# Patient Record
Sex: Female | Born: 1945 | Race: White | Hispanic: No | Marital: Single | Smoking: Former smoker
Health system: Southern US, Community
[De-identification: ages and names within clinical notes are randomized; demographics above are authoritative.]

## PROBLEM LIST (undated history)

## (undated) DIAGNOSIS — G43909 Migraine, unspecified, not intractable, without status migrainosus: Secondary | ICD-10-CM

## (undated) DIAGNOSIS — T8859XA Other complications of anesthesia, initial encounter: Secondary | ICD-10-CM

## (undated) DIAGNOSIS — N809 Endometriosis, unspecified: Secondary | ICD-10-CM

## (undated) DIAGNOSIS — Z9889 Other specified postprocedural states: Secondary | ICD-10-CM

## (undated) DIAGNOSIS — T4145XA Adverse effect of unspecified anesthetic, initial encounter: Secondary | ICD-10-CM

## (undated) DIAGNOSIS — K219 Gastro-esophageal reflux disease without esophagitis: Secondary | ICD-10-CM

## (undated) DIAGNOSIS — F329 Major depressive disorder, single episode, unspecified: Secondary | ICD-10-CM

## (undated) DIAGNOSIS — R079 Chest pain, unspecified: Secondary | ICD-10-CM

## (undated) DIAGNOSIS — R112 Nausea with vomiting, unspecified: Secondary | ICD-10-CM

## (undated) DIAGNOSIS — M1990 Unspecified osteoarthritis, unspecified site: Secondary | ICD-10-CM

## (undated) DIAGNOSIS — A6000 Herpesviral infection of urogenital system, unspecified: Secondary | ICD-10-CM

## (undated) DIAGNOSIS — F32A Depression, unspecified: Secondary | ICD-10-CM

## (undated) DIAGNOSIS — M8580 Other specified disorders of bone density and structure, unspecified site: Secondary | ICD-10-CM

## (undated) HISTORY — DX: Major depressive disorder, single episode, unspecified: F32.9

## (undated) HISTORY — PX: ABDOMINAL HYSTERECTOMY: SHX81

## (undated) HISTORY — PX: ABDOMINAL SURGERY: SHX537

## (undated) HISTORY — DX: Migraine, unspecified, not intractable, without status migrainosus: G43.909

## (undated) HISTORY — DX: Gastro-esophageal reflux disease without esophagitis: K21.9

## (undated) HISTORY — DX: Chest pain, unspecified: R07.9

## (undated) HISTORY — DX: Endometriosis, unspecified: N80.9

## (undated) HISTORY — DX: Other specified disorders of bone density and structure, unspecified site: M85.80

## (undated) HISTORY — DX: Unspecified osteoarthritis, unspecified site: M19.90

## (undated) HISTORY — DX: Depression, unspecified: F32.A

## (undated) HISTORY — PX: EXPLORATORY LAPAROTOMY: SUR591

## (undated) HISTORY — DX: Herpesviral infection of urogenital system, unspecified: A60.00

---

## 1969-12-23 HISTORY — PX: SALPINGOOPHORECTOMY: SHX82

## 1998-12-23 HISTORY — PX: COLONOSCOPY: SHX174

## 2005-12-23 HISTORY — PX: BUNIONECTOMY: SHX129

## 2010-12-23 HISTORY — PX: CATARACT EXTRACTION: SUR2

## 2011-09-16 DIAGNOSIS — H905 Unspecified sensorineural hearing loss: Secondary | ICD-10-CM | POA: Insufficient documentation

## 2011-09-16 DIAGNOSIS — I10 Essential (primary) hypertension: Secondary | ICD-10-CM | POA: Insufficient documentation

## 2011-09-16 DIAGNOSIS — F329 Major depressive disorder, single episode, unspecified: Secondary | ICD-10-CM | POA: Insufficient documentation

## 2011-09-16 DIAGNOSIS — M8580 Other specified disorders of bone density and structure, unspecified site: Secondary | ICD-10-CM | POA: Insufficient documentation

## 2011-09-16 DIAGNOSIS — A6000 Herpesviral infection of urogenital system, unspecified: Secondary | ICD-10-CM | POA: Insufficient documentation

## 2011-09-16 DIAGNOSIS — G43909 Migraine, unspecified, not intractable, without status migrainosus: Secondary | ICD-10-CM | POA: Insufficient documentation

## 2011-09-16 DIAGNOSIS — N390 Urinary tract infection, site not specified: Secondary | ICD-10-CM | POA: Insufficient documentation

## 2011-09-16 DIAGNOSIS — K5792 Diverticulitis of intestine, part unspecified, without perforation or abscess without bleeding: Secondary | ICD-10-CM | POA: Insufficient documentation

## 2011-12-24 HISTORY — PX: ROTATOR CUFF REPAIR: SHX139

## 2013-01-18 ENCOUNTER — Ambulatory Visit (HOSPITAL_COMMUNITY)
Admission: RE | Admit: 2013-01-18 | Discharge: 2013-01-18 | Disposition: A | Discharge status: Home / Self Care | Payer: Medicare Other | Source: Ambulatory Visit | Attending: Nurse Practitioner | Admitting: Nurse Practitioner

## 2013-01-18 ENCOUNTER — Other Ambulatory Visit (HOSPITAL_COMMUNITY): Payer: Self-pay | Admitting: *Deleted

## 2013-01-18 DIAGNOSIS — R079 Chest pain, unspecified: Secondary | ICD-10-CM | POA: Insufficient documentation

## 2013-01-27 ENCOUNTER — Ambulatory Visit (INDEPENDENT_AMBULATORY_CARE_PROVIDER_SITE_OTHER): Payer: Medicare Other | Admitting: Cardiology

## 2013-01-27 ENCOUNTER — Encounter: Payer: Self-pay | Admitting: Cardiology

## 2013-01-27 VITALS — BP 102/58 | HR 82 | Ht 63.0 in | Wt 154.5 lb

## 2013-01-27 DIAGNOSIS — Z9189 Other specified personal risk factors, not elsewhere classified: Secondary | ICD-10-CM

## 2013-01-27 DIAGNOSIS — R079 Chest pain, unspecified: Secondary | ICD-10-CM

## 2013-01-27 DIAGNOSIS — Z9289 Personal history of other medical treatment: Secondary | ICD-10-CM

## 2013-01-27 DIAGNOSIS — K219 Gastro-esophageal reflux disease without esophagitis: Secondary | ICD-10-CM

## 2013-01-27 NOTE — Patient Instructions (Addendum)
Your physician recommends that you schedule a follow-up appointment in: Make an appt in 2 weeks if sx persist  May take aleve 400 mg bid x 2 weeks  Your physician has requested that you have a stress echocardiogram. For further information please visit [email or web address] Please follow instruction sheet as given.

## 2013-01-27 NOTE — Progress Notes (Deleted)
Name: Maisie Hauser    DOB: 1946/02/04  Age: 67 y.o.  MR#: [id number]       PCP:  Ninfa Linden, FNP      Insurance: @PAYORNAME @   CC:   No chief complaint on file.  Medication list  VS BP 102/58  Pulse 82  Ht 5\' 3"  (1.6 m)  Wt 154 lb 8 oz (70.081 kg)  BMI 27.37 kg/m2  SpO2 98%  Weights Current Weight  01/27/13 154 lb 8 oz (70.081 kg)    Blood Pressure  BP Readings from Last 3 Encounters:  01/27/13 102/58     Admit date:  (Not on file) Last encounter with RMR:  Visit date not found   Allergy Allergies  Allergen Reactions  . Fentanyl And Related   . Vercyte (Pipobroman)     No current outpatient prescriptions on file.    Discontinued Meds:   There are no discontinued medications.  There is no problem list on file for this patient.   LABS No results found for any previous visit.   Results for this Opt Visit:    No results found for this or any previous visit.  EKG Orders placed in visit on 01/27/13  . EKG 12-LEAD     Prior Assessment and Plan    Imaging: Dg Chest 2 View  01/18/2013  *RADIOLOGY REPORT*  Clinical Data: Chest pain.  CHEST - 2 VIEW  Comparison: None  Findings: The cardiac silhouette, mediastinal and hilar contours are normal.  The lungs are clear.  No pleural effusion.  The bony thorax is intact.  IMPRESSION: Normal chest x-ray.   Original Report Authenticated By: Rudie Meyer, M.D.      [facility] Calculation: Score not calculated. Missing: Total Cholesterol, HDL

## 2013-01-28 ENCOUNTER — Encounter: Payer: Self-pay | Admitting: Cardiology

## 2013-01-28 NOTE — Progress Notes (Signed)
Patient ID: Megan Ingram, female   DOB: 05-Aug-1946, 67 y.o.   MRN: [id number]  HPI: Initial Cardiology evaluation performed at the kind request of Ninfa Linden, NP, for assessment of chest discomfort.  This nice woman has enjoyed generally excellent health with no history of cardiovascular problems nor significant cardiac risk factors.  In recent weeks, she has noted episodes of localized left chest pain, behind her left breast in approximately the mid axillary line.  There is been some associated chest wall tenderness no dyspnea, nausea nor diaphoresis.  Discomfort is particularly associated with assuming the supine position, but occurrence has been variable.  Duration is less than 30 minutes.  She reports intermittent cough and dyspnea in the week or 2 preceding onset of discomfort, perhaps associated with a upper respiratory infection.  The discomfort is characterized as tightness and is mild to moderate in severity.  There is radiation towards the left lateral chest  Allergies  Allergen Reactions  . Fentanyl And Related   . Versed (Midazolam) Rash    Past Medical History  Diagnosis Date  . Chest pain   . Tobacco abuse    Past Surgical History  Procedure Date  . Cesarean section 1968  . Colonoscopy 2000    incomplete due to pain  . Salpingoophorectomy 1971  . Abdominal hysterectomy     Single ovary remained in place.  . Cataract extraction 2012    bilateral  . Rotator cuff repair 2013    Left   History reviewed. No pertinent family history.   History   Social History  . Marital Status: Single    Spouse Name: N/A    Number of Children: N/A  . Years of Education: N/A   Occupational History  . Not on file.   Social History Main Topics  . Smoking status: Former Smoker    Start date: 01/27/1977    Quit date: 01/27/1979  . Smokeless tobacco: Not on file  . Alcohol Use: 0.0 oz/week  . Drug Use: No  . Sexually Active: Not on file   Other Topics Concern  . Not on file    Social History Narrative  . No narrative on file  ROS:  Intermittent dyspnea, not necessarily related to exertion; dyspepsia; arthritic discomfort of the hands; intermittent depression.  All other systems reviewed and are negative.  PHYSICAL EXAM: BP 102/58  Pulse 82  Ht 5\' 3"  (1.6 m)  Wt 70.081 kg (154 lb 8 oz)  BMI 27.37 kg/m2  SpO2 98%  General-Well-developed; no acute distress Body Habitus-proportionate weight and height HEENT-[place]/AT; PERRL; EOM intact; conjunctiva and lids nl Neck-No JVD; no carotid bruits Endocrine-No thyromegaly Lungs-Clear lung fields; resonant percussion; normal I-to-E ratio Cardiovascular- normal PMI; normal S1 and S2; regular rhythm; modest systolic ejection murmur Abdomen-BS normal; soft and non-tender without masses or organomegaly Musculoskeletal-No deformities, cyanosis or clubbing Neurologic-Nl cranial nerves; symmetric strength and tone Skin- Warm, no significant lesions Extremities-Nl distal pulses; no edema  EKG:  Normal sinus rhythm; left atrial abnormality; minimal nonspecific T wave abnormality; no previous tracing for comparison.   ASSESSMENT AND PLAN:  Bier Bing, MD 01/28/2013 10:47 AM

## 2013-01-29 ENCOUNTER — Encounter: Payer: Self-pay | Admitting: Cardiology

## 2013-01-29 DIAGNOSIS — K219 Gastro-esophageal reflux disease without esophagitis: Secondary | ICD-10-CM | POA: Insufficient documentation

## 2013-01-29 DIAGNOSIS — Z9289 Personal history of other medical treatment: Secondary | ICD-10-CM | POA: Insufficient documentation

## 2013-01-29 DIAGNOSIS — R079 Chest pain, unspecified: Secondary | ICD-10-CM | POA: Insufficient documentation

## 2013-01-29 MED ORDER — CITALOPRAM HYDROBROMIDE 40 MG PO TABS: 40.0000 mg | ORAL_TABLET | Freq: Every day | ORAL | Status: DC

## 2013-01-29 MED ORDER — NAPROXEN 250 MG PO TABS: 200.0000 mg | ORAL_TABLET | Freq: Two times a day (BID) | ORAL | Status: DC | PRN

## 2013-01-29 MED ORDER — ACYCLOVIR 400 MG PO TABS: 400.0000 mg | ORAL_TABLET | Freq: Every day | ORAL | Status: DC

## 2013-01-29 MED ORDER — ASPIRIN EC 325 MG PO TBEC: 325.0000 mg | DELAYED_RELEASE_TABLET | Freq: Four times a day (QID) | ORAL | Status: DC | PRN

## 2013-01-29 MED ORDER — OMEPRAZOLE 20 MG PO CPDR: 20.0000 mg | DELAYED_RELEASE_CAPSULE | Freq: Every day | ORAL | Status: DC

## 2013-01-29 NOTE — Assessment & Plan Note (Addendum)
Chest discomfort is atypical and most likely of musculoskeletal origin.  Cardiovascular risk factors are modest. We will proceed with a stress echocardiogram to exclude the presence of obstructive coronary disease.  I have recommended a two-week trial of daily nonsteroidal treatment.  Patient will call if symptoms persist for further evaluation.

## 2013-02-09 ENCOUNTER — Ambulatory Visit (HOSPITAL_COMMUNITY)
Admission: RE | Admit: 2013-02-09 | Discharge: 2013-02-09 | Disposition: A | Discharge status: Home / Self Care | Payer: Medicare Other | Source: Ambulatory Visit | Attending: Cardiology | Admitting: Cardiology

## 2013-02-09 DIAGNOSIS — R079 Chest pain, unspecified: Secondary | ICD-10-CM

## 2013-02-09 NOTE — Progress Notes (Signed)
Stress Lab Nurses Notes - [facility]king  Witney Huie 02/09/2013 Reason for doing test: Chest Pain Type of test: Stress Echo Nurse performing test: Parke Poisson, RN Nuclear Medicine Tech: Not Applicable Echo Tech: Karrie Doffing MD performing test: R. Dietrich Pates Family MD: YPC Test explained and consent signed: yes IV started: No IV started Symptoms: Fatigue & Leg discomfort. Treatment/Intervention: None Reason test stopped: fatigue and reached target HR After recovery IV was: NA Patient to return to Nuc. Med at : NA Patient discharged: Home Patient's Condition upon discharge was: stable Comments: During test peak BP 161/60 & HR 169.  Recovery BP 130/57 & HR 106.  Symptoms resolved in recovery. Erskine Speed T

## 2013-02-09 NOTE — Progress Notes (Signed)
*  PRELIMINARY RESULTS* Echocardiogram Echocardiogram Stress Test has been performed.  Megan Ingram  02/09/2013, 10:52 AM

## 2013-02-11 ENCOUNTER — Ambulatory Visit: Payer: Medicare Other | Admitting: Cardiovascular Disease

## 2013-04-27 ENCOUNTER — Other Ambulatory Visit (HOSPITAL_COMMUNITY): Payer: Self-pay | Admitting: Nurse Practitioner

## 2013-04-27 DIAGNOSIS — M8580 Other specified disorders of bone density and structure, unspecified site: Secondary | ICD-10-CM

## 2013-04-28 ENCOUNTER — Ambulatory Visit (INDEPENDENT_AMBULATORY_CARE_PROVIDER_SITE_OTHER): Payer: Medicare Other | Admitting: Orthopedic Surgery

## 2013-04-28 ENCOUNTER — Encounter: Payer: Self-pay | Admitting: Orthopedic Surgery

## 2013-04-28 ENCOUNTER — Ambulatory Visit (INDEPENDENT_AMBULATORY_CARE_PROVIDER_SITE_OTHER): Payer: Medicare Other

## 2013-04-28 VITALS — BP 108/62 | Ht 63.0 in | Wt 148.0 lb

## 2013-04-28 DIAGNOSIS — M539 Dorsopathy, unspecified: Secondary | ICD-10-CM

## 2013-04-28 DIAGNOSIS — M5431 Sciatica, right side: Secondary | ICD-10-CM

## 2013-04-28 DIAGNOSIS — M549 Dorsalgia, unspecified: Secondary | ICD-10-CM

## 2013-04-28 DIAGNOSIS — M47817 Spondylosis without myelopathy or radiculopathy, lumbosacral region: Secondary | ICD-10-CM | POA: Insufficient documentation

## 2013-04-28 DIAGNOSIS — M5430 Sciatica, unspecified side: Secondary | ICD-10-CM

## 2013-04-28 MED ORDER — PREDNISONE 10 MG PO KIT: 10.0000 mg | PACK | ORAL | Status: DC

## 2013-04-28 MED ORDER — METHOCARBAMOL 500 MG PO TABS: 500.0000 mg | ORAL_TABLET | Freq: Three times a day (TID) | ORAL | Status: DC

## 2013-04-28 NOTE — Patient Instructions (Addendum)
PT at [facility] in [place] Diagnosis back pain with sciatica  At this point I think you can be managed without operative intervention.  I would recommend  physical therapy to learn spinal stabilization techniques You have an acute attack, I would take a steroid Dosepak and a muscle relaxer (we will keep a standing order for you at the pharmacy) and rest until the attack goes away I would continue the Naprosyn for now If you have an episode of constant pain radiating down her leg into her foot that I would recommend an MRI followed by epidural steroid injections  Back Pain, Adult Low back pain is very common. About 1 in 5 people have back pain.The cause of low back pain is rarely dangerous. The pain often gets better over time.About half of people with a sudden onset of back pain feel better in just 2 weeks. About 8 in 10 people feel better by 6 weeks.  CAUSES Some common causes of back pain include:  Strain of the muscles or ligaments supporting the spine.  Wear and tear (degeneration) of the spinal discs.  Arthritis.  Direct injury to the back. DIAGNOSIS Most of the time, the direct cause of low back pain is not known.However, back pain can be treated effectively even when the exact cause of the pain is unknown.Answering your caregiver's questions about your overall health and symptoms is one of the most accurate ways to make sure the cause of your pain is not dangerous. If your caregiver needs more information, he or she may order lab work or imaging tests (X-rays or MRIs).However, even if imaging tests show changes in your back, this usually does not require surgery. HOME CARE INSTRUCTIONS For many people, back pain returns.Since low back pain is rarely dangerous, it is often a condition that people can learn to [facility] their own.   Remain active. It is stressful on the back to sit or stand in one place. Do not sit, drive, or stand in one place for more than 30 minutes at  a time. Take short walks on level surfaces as soon as pain allows.Try to increase the length of time you walk each day.  Do not stay in bed.Resting more than 1 or 2 days can delay your recovery.  Do not avoid exercise or work.Your body is made to move.It is not dangerous to be active, even though your back may hurt.Your back will likely heal faster if you return to being active before your pain is gone.  Pay attention to your body when you bend and lift. Many people have less discomfortwhen lifting if they bend their knees, keep the load close to their bodies,and avoid twisting. Often, the most comfortable positions are those that put less stress on your recovering back.  Find a comfortable position to sleep. Use a firm mattress and lie on your side with your knees slightly bent. If you lie on your back, put a pillow under your knees.  Only take over-the-counter or prescription medicines as directed by your caregiver. Over-the-counter medicines to reduce pain and inflammation are often the most helpful.Your caregiver may prescribe muscle relaxant drugs.These medicines help dull your pain so you can more quickly return to your normal activities and healthy exercise.  Put ice on the injured area.  Put ice in a plastic bag.  Place a towel between your skin and the bag.  Leave the ice on for 15 to 20 minutes, 3 to 4 times a day for the first 2 to  3 days. After that, ice and heat may be alternated to reduce pain and spasms.  Ask your caregiver about trying back exercises and gentle massage. This may be of some benefit.  Avoid feeling anxious or stressed.Stress increases muscle tension and can worsen back pain.It is important to recognize when you are anxious or stressed and learn ways to manage it.Exercise is a great option. SEEK MEDICAL CARE IF:  You have pain that is not relieved with rest or medicine.  You have pain that does not improve in 1 week.  You have new  symptoms.  You are generally not feeling well. SEEK IMMEDIATE MEDICAL CARE IF:   You have pain that radiates from your back into your legs.  You develop new bowel or bladder control problems.  You have unusual weakness or numbness in your arms or legs.  You develop nausea or vomiting.  You develop abdominal pain.  You feel faint. Document Released: 12/09/2005 Document Revised: 06/09/2012 Document Reviewed: 04/29/2011 [facility] Patient Information 2013 [place], [place]. Sciatica Sciatica is a condition often seen in patients with disk disease of the lower back. Pressure on the sciatica nerve causes pain to radiate from the lower back or buttock down the leg.  CAUSES  It results from pressure on nerve roots coming out of the spine. This is often the result of a disc that deteriorates and pushes to one side. Often there is a history of back problems.  TREATMENT  In most cases sciatica improves greatly with conservative treatment (treatment that does not involve surgery). Most patients are completely better after 2-4 weeks of bed rest and other supportive care. Bed rest reduces the disc pressure greatly. Sitting is the worst position. When sitting pressure on the disc is over 5 times greater than it is while lying down. Avoid:  Bending.   Lifting.   All other activities which make the problem worse.  After the pain improves, you may continue with normal activity. Take brief periods for bed rest throughout the day until you are back to normal. Only take over-the-counter or prescription medicines for pain, discomfort, or fever as directed by your caregiver. Muscle relaxants may help by relieving spasm and providing mild sedation. Cold or heat therapy and massage may also give significant relief. Spinal manipulation is not recommended because it can increase the degree of disc protrusion. Traction can be used in severe cases. Surgery is reserved for patients who:  Do not improve within the  first months of conservative treatment.   Have signs of severe nerve root pressure.  See your doctor for follow up care as recommended. A program for back injury rehabilitation with stretching and strengthening exercises is an important part of healing.  SEEK MEDICAL CARE IF:   You notice increased pain.   You notice weakness.   You have numbness in your legs.   You have any difficulty with bladder or bowel control.  Document Released: 01/16/2005 Document Revised: 11/28/2011 Document Reviewed: 12/09/2005 [facility] Patient Information 2012 [place], [place].

## 2013-04-28 NOTE — Progress Notes (Signed)
Patient ID: Megan Ingram, female   DOB: 05-29-1946, 67 y.o.   MRN: [id number] Chief Complaint  Patient presents with  . Pain    Lower right back and hip pain    67 year old presents with two-month history of increasing SI joint lower back and right hip pain and mild sciatic nerve symptoms unrelieved by 2 weeks of chiropractic adjustment and Naprosyn.  The pain is primarily in the SI joint and the right gluteal area occasionally radiates down the leg has a baseline level of 3-4 and increases to a level of 8-9. The pain seems to come and go. It issues improved with rest and Naprosyn. It's worse when she's very active or standing for long periods of time and is a catching sensation as well.  She reports history of unstable SI joint which sounds like she had a bad muscle spasm and pelvic alignment became unequal. Review of systems history of GERD history of joint pain and stiffness in the fingers going to develop interphalangeal joint nodes history of depression history of seasonal allergies and she is allergic to bees  Medical allergies include fentanyl and Versed  Past Medical History  Diagnosis Date  . Chest pain   . Gastroesophageal reflux disease   . Genital herpes   . Endometriosis   . Degenerative joint disease   . Osteopenia   . Depression   . Migraines     Ceased at menopause   Family history of heart disease and arthritis lung disease and cancer  Currently single retired Engineer, civil (consulting) does not smoke very little alcohol  BP 108/62  Ht 5\' 3"  (1.6 m)  Wt 148 lb (67.132 kg)  BMI 26.22 kg/m2 General appearance is normal, the patient is alert and oriented x3 with normal mood and affect. Small frame normal body habitus normal grooming  Standing posture no standing posture malalignments  In the upper extremities on inspection we supply no malalignments, there are no contractures, atrophy subluxation or tremor, no skin abnormalities  Lower extremities normal leg lengths no tenderness  full range of motion hip and knee hips and knees are stable strength and muscle tone are normal and skin is normal  Distal pulses are intact excellent sharp and soft touch sensation and distention deep tendon reflexes knee and ankle are 2+ straight leg raises are negative hamstring tightness noted. No balance issues  She is tender no lumbar spine right SI joint right gluteal area. Muscle tone is normal skin is intact and she is very flexible.  Assessment X-rays of the spine:  Diagnosis:  Plan at this point the patient can be managed nonoperatively. A good spinal stabilization program continue Naprosyn as needed. She gets an acute attack I recommend a standing steroid Dosepak and muscle relaxer. If her pain becomes constant then an MRI in preparation for epidural steroids would be the next  Patient will follow as needed

## 2013-05-03 ENCOUNTER — Other Ambulatory Visit (HOSPITAL_COMMUNITY): Payer: BC Managed Care – PPO

## 2013-05-03 ENCOUNTER — Ambulatory Visit (HOSPITAL_COMMUNITY)
Admission: RE | Admit: 2013-05-03 | Discharge: 2013-05-03 | Disposition: A | Discharge status: Home / Self Care | Payer: Medicare Other | Source: Ambulatory Visit | Attending: Nurse Practitioner | Admitting: Nurse Practitioner

## 2013-05-03 DIAGNOSIS — Z780 Asymptomatic menopausal state: Secondary | ICD-10-CM | POA: Insufficient documentation

## 2013-05-03 DIAGNOSIS — M949 Disorder of cartilage, unspecified: Secondary | ICD-10-CM | POA: Insufficient documentation

## 2013-05-03 DIAGNOSIS — M8580 Other specified disorders of bone density and structure, unspecified site: Secondary | ICD-10-CM

## 2013-05-03 DIAGNOSIS — M899 Disorder of bone, unspecified: Secondary | ICD-10-CM | POA: Insufficient documentation

## 2013-06-03 ENCOUNTER — Encounter (HOSPITAL_COMMUNITY): Payer: Self-pay | Admitting: Cardiology

## 2013-09-23 ENCOUNTER — Ambulatory Visit (INDEPENDENT_AMBULATORY_CARE_PROVIDER_SITE_OTHER): Payer: Medicare Other | Admitting: Orthopedic Surgery

## 2013-09-23 ENCOUNTER — Encounter: Payer: Self-pay | Admitting: Orthopedic Surgery

## 2013-09-23 VITALS — BP 115/68 | Ht 63.0 in | Wt 149.0 lb

## 2013-09-23 DIAGNOSIS — M7660 Achilles tendinitis, unspecified leg: Secondary | ICD-10-CM | POA: Insufficient documentation

## 2013-09-23 DIAGNOSIS — M549 Dorsalgia, unspecified: Secondary | ICD-10-CM

## 2013-09-23 DIAGNOSIS — M5432 Sciatica, left side: Secondary | ICD-10-CM

## 2013-09-23 DIAGNOSIS — IMO0002 Reserved for concepts with insufficient information to code with codable children: Secondary | ICD-10-CM

## 2013-09-23 DIAGNOSIS — M5430 Sciatica, unspecified side: Secondary | ICD-10-CM

## 2013-09-23 NOTE — Progress Notes (Signed)
Patient ID: Megan Ingram, female   DOB: 08-22-1946, 67 y.o.   MRN: [id number]  Chief Complaint  Patient presents with  . Back Pain    lower back pain    Followup visit for this 67 year old female who has back pain and was treated with physical therapy and steroids and activity modification presents back with radicular symptoms and perineal numbness  Bowel and bladder function remains intact  She's also complaining of pain and swelling in her left Achilles history of right Achilles tendinitis with scarring and nodularity in the tendon  Left tendon is swollen and tender she has painful range of motion on dorsiflexion  As far as the back goes she is a straight leg raise at 40 on the left no weakness in the left leg reflexes remain intact slight sensory abnormality in the L5 distribution dermatome  Vital signs are stable Blood pressure 115/68, height 5\' 3"  (1.6 m), weight 149 lb (67.586 kg). General appearance is normal, the patient is alert and oriented x3 with normal mood and affect.  Encounter Diagnoses  Name Primary?  . Herniated disc Yes  . Back pain   . Sciatica neuralgia, left   . Achilles bursitis or tendinitis     She will need an MRI of her back to prepare for epidural steroid injections  Recommend heel elevation for Achilles tendon with Aspercreme ice. (Did not place her in a boot inferior of aggravating her disc disease and she does not tolerate anti-inflammatories so no NSAIDs at this time.

## 2013-09-23 NOTE — Patient Instructions (Addendum)
Mri l spine   (Ice for heel aspercreme and clog)Epidural Steroid Injection An epidural steroid injection is given to relieve pain in the neck, back, or legs. This procedure involves injecting a steroid and numbing medicine (local anesthetic) into the epidural space. The epidural space is the space between the outer covering of the spinal cord and the vertebra. The epidural steroid injection helps in reducing the pain that is caused by the irritation or swelling of the nerve root. However, it does not cure the underlying problem. The injection may be given for the following conditions:  Changes in the soft, gel-like cushion between two vertebrae (disk) due to wear and tear.  A reduction in the space within the spinal canal.  Slipped or herniated disk.  Low back (lumbar) sprain.  Sciatica. This is shooting pain that radiates down the buttocks and the back of the leg due to compression of the nerve.  Traumatic compression fracture of the vertebra.  Pain that develops after a surgery of the spine.  Pain that arises after an attack of viral infection affecting the nerves (shingles). LET YOUR CAREGIVER KNOW ABOUT:   Allergies to food or medicine.  Medicines taken, including vitamins, herbs, eyedrops, over-the-counter medicines, and creams.  Use of steroids (by mouth or creams).  Previous problems with anesthetics or numbing medicines.  History of bleeding problems or blood clots.  Previous surgery.  Other health problems, including diabetes and kidney problems.  Possibility of pregnancy, if this applies.     RISKS AND COMPLICATIONS The complications due to the needle insertion are:  Headache.  Bleeding.  Infection.  Allergic reaction to the medicines.  Damage to the nerves. The complications due to the steroid are:  Weight gain.  Hot flashes.  Mood swings.  Lack of sleep.  Increase in blood sugar levels, especially if you are diabetic.  Retention of  water. The response to this procedure depends on the underlying cause of the pain and its duration. Patients who have long-term (chronic) pain are less likely to benefit from epidural steroids than are patients whose pain comes on strong and suddenly. BEFORE THE PROCEDURE   The caregiver may ask about your symptoms, do a detailed exam, and advise some tests. These tests may include imaging studies. Your caregiver may review the results of your tests and discuss the procedure with you.  Ask your caregiver about changing or stopping your regular medicines. You may be advised to stop taking blood-thinning medicines a few days before the procedure.  You may also be given medicines to reduce your anxiety. PROCEDURE  You will remain awake during the whole procedure. Although, you may receive medicine to make you sleepy. You will be asked to lie on your stomach. The site of the injection is cleansed. Then, the injection site is numbed using a small amount of medicine that numbs the area (local anesthetic). A hollow needle is directed through your skin into the epidural space with the help of an X-ray. The X-ray helps to ensure that the steroid is delivered closest to the affected nerve. You may have some minimal discomfort at this time. Once the needle is in the right position, the local anesthetic and the steroid are injected into the epidural space. The needle is then removed. The skin is cleaned and a bandage is applied. The entire procedure takes only a few minutes, although repeated injections may be required (up to 3 to 4 injections over several weeks).  AFTER THE PROCEDURE   You may  be monitored for a short time before you go home.  You may feel weakness or numbness in your arm or leg, which disappears within 1 to 2 hours.  Someone must drive you home or accompany you home if you are taking a taxi.  You may be allowed to eat, drink, and take your regular medicine.  Your pain may improve or  worsen right after the procedure.  You may feel the beneficial effect of the steroid a few days later.  You may have soreness at the site of the injection.  If you have only partial relief of the pain, the injection may be repeated once or even twice within 4 to 8 weeks of the initial injection. Document Released: 03/17/2008 Document Revised: 03/02/2012 Document Reviewed: 04/20/2009 [facility] Patient Information 2014 [place], [place].

## 2013-09-30 ENCOUNTER — Ambulatory Visit (HOSPITAL_COMMUNITY): Payer: Medicare Other

## 2013-10-04 ENCOUNTER — Ambulatory Visit (HOSPITAL_COMMUNITY)
Admission: RE | Admit: 2013-10-04 | Discharge: 2013-10-04 | Disposition: A | Discharge status: Home / Self Care | Payer: Medicare Other | Source: Ambulatory Visit | Attending: Orthopedic Surgery | Admitting: Orthopedic Surgery

## 2013-10-04 ENCOUNTER — Telehealth: Payer: Self-pay | Admitting: *Deleted

## 2013-10-04 DIAGNOSIS — M545 Low back pain, unspecified: Secondary | ICD-10-CM | POA: Insufficient documentation

## 2013-10-04 DIAGNOSIS — IMO0002 Reserved for concepts with insufficient information to code with codable children: Secondary | ICD-10-CM

## 2013-10-04 DIAGNOSIS — M5137 Other intervertebral disc degeneration, lumbosacral region: Secondary | ICD-10-CM | POA: Insufficient documentation

## 2013-10-04 DIAGNOSIS — M47817 Spondylosis without myelopathy or radiculopathy, lumbosacral region: Secondary | ICD-10-CM | POA: Insufficient documentation

## 2013-10-04 DIAGNOSIS — M51379 Other intervertebral disc degeneration, lumbosacral region without mention of lumbar back pain or lower extremity pain: Secondary | ICD-10-CM | POA: Insufficient documentation

## 2013-10-04 NOTE — Telephone Encounter (Signed)
Patient has MRI at [facility], was scheduled on 09-30-13 but patient was going to call and reschedule. Patient has Medicare, no precert is needed. Patient is aware of her appointment and will call  back to schedule an appointment to review MRI.

## 2013-10-07 ENCOUNTER — Ambulatory Visit (INDEPENDENT_AMBULATORY_CARE_PROVIDER_SITE_OTHER): Payer: Medicare Other | Admitting: Orthopedic Surgery

## 2013-10-07 VITALS — BP 127/84 | Ht 63.0 in | Wt 149.0 lb

## 2013-10-07 DIAGNOSIS — M48062 Spinal stenosis, lumbar region with neurogenic claudication: Secondary | ICD-10-CM

## 2013-10-07 MED ORDER — HYDROCODONE-ACETAMINOPHEN 5-325 MG PO TABS: 1.0000 | ORAL_TABLET | Freq: Four times a day (QID) | ORAL | Status: DC | PRN

## 2013-10-07 NOTE — Patient Instructions (Addendum)
REFERRAL TO NEUROSURGERY   YOU HAVE SPINAL STENOSIS   CLINICAL DATA:  Low back pain radiating to both legs.   EXAM: MRI LUMBAR SPINE WITHOUT CONTRAST   TECHNIQUE: Multiplanar, multisequence MR imaging was performed. No intravenous contrast was administered.   COMPARISON:  04/28/2013   FINDINGS: The lowest lumbar type non-rib-bearing vertebra is labeled as L5. The conus medullaris appears normal. Conus level: L1.   There is 3 mm degenerative posterior subluxation at L2-3 and L3-4. Low-level degenerative facet edema noted bilaterally at L4-5 and L5-S1 with disc desiccation at all levels between L2 and S1.   There is mild central stenosis at T10-11 due to a central disc protrusion. No impingement at T11-12, although there is left facet spurring. Additional findings at individual levels are as follows:   T12-L1: No impingement. Mild disc bulge.   L1-2: Unremarkable.   L2-3: No impingement. Mild disc bulge.   L3-4: Mild displacement of the right L3 nerve in the lateral extraforaminal space due to disc bulge. Borderline left subarticular lateral recess stenosis and borderline left foraminal stenosis due to disc bulge and facet arthropathy.   L4-5: Mild left and borderline right subarticular lateral recess stenosis along with borderline bilateral foraminal stenosis due to disc bulge, facet arthropathy, and ligamentum flavum redundancy. Small central annular tear.   L5-S1: Mild bilateral subarticular lateral recess stenosis and borderline bilateral foraminal stenosis due to facet arthropathy and disc bulge.   IMPRESSION: 1. Lumbar spondylosis and degenerative disc disease, resulting in mild impingement at T10-11, L3-4, L4-5, and L5-S1 as noted above.     Electronically Signed   By: Herbie Baltimore M.D.   On: 10/04/2013 17:23

## 2013-10-08 ENCOUNTER — Telehealth: Payer: Self-pay | Admitting: *Deleted

## 2013-10-08 ENCOUNTER — Other Ambulatory Visit: Payer: Self-pay | Admitting: *Deleted

## 2013-10-08 DIAGNOSIS — M48061 Spinal stenosis, lumbar region without neurogenic claudication: Secondary | ICD-10-CM

## 2013-10-08 NOTE — Progress Notes (Signed)
Patient ID: Megan Ingram, female   DOB: 1946-10-04, 67 y.o.   MRN: [id number]  Chief Complaint  Patient presents with  . Results    MRI results    Blood pressure 127/84, height 5\' 3"  (1.6 m), weight 149 lb (67.586 kg).  Encounter Diagnosis  Name Primary?  . Spinal stenosis, lumbar region, with neurogenic claudication Yes    The patient came in today to review and discuss her situation regarding her back and leg pain. She had an MRI which shows she has spinal stenosis multifactorial. Review of systems negative for any perianal sensory loss or bowel or bladder dysfunction  General appearance is normal, the patient is alert and oriented x3 with normal mood and affect. Vitals as discussed above and recorded. Ambulation remains normal. No weakness in her legs at this point. She's asking for some pain medication.  Discussed with her that it's probably contraindicated in most cases but we would write her one prescription as we refer her to neurosurgery for further management and treatment

## 2013-10-08 NOTE — Telephone Encounter (Signed)
Faxed Referral and office notes to [facility]. Awaiting appointment.

## 2013-10-18 NOTE — Telephone Encounter (Signed)
Patient has an appointment with Dr. Jordan Likes in the [place] office on 10/29/13 at 1:00 pm. Patient is aware of appointment.

## 2013-11-29 ENCOUNTER — Other Ambulatory Visit (HOSPITAL_COMMUNITY): Payer: Self-pay | Admitting: Nurse Practitioner

## 2013-11-29 DIAGNOSIS — Z139 Encounter for screening, unspecified: Secondary | ICD-10-CM

## 2013-12-03 ENCOUNTER — Ambulatory Visit (HOSPITAL_COMMUNITY)
Admission: RE | Admit: 2013-12-03 | Discharge: 2013-12-03 | Disposition: A | Discharge status: Home / Self Care | Payer: Medicare Other | Source: Ambulatory Visit | Attending: Nurse Practitioner | Admitting: Nurse Practitioner

## 2013-12-03 DIAGNOSIS — Z139 Encounter for screening, unspecified: Secondary | ICD-10-CM

## 2013-12-03 DIAGNOSIS — Z1231 Encounter for screening mammogram for malignant neoplasm of breast: Secondary | ICD-10-CM | POA: Insufficient documentation

## 2014-01-28 ENCOUNTER — Encounter (HOSPITAL_COMMUNITY): Payer: Self-pay | Admitting: Emergency Medicine

## 2014-01-28 ENCOUNTER — Emergency Department (HOSPITAL_COMMUNITY): Payer: Medicare Other

## 2014-01-28 ENCOUNTER — Emergency Department (HOSPITAL_COMMUNITY)
Admission: EM | Admit: 2014-01-28 | Discharge: 2014-01-28 | Disposition: A | Discharge status: Home / Self Care | Payer: Medicare Other | Attending: Emergency Medicine | Admitting: Emergency Medicine

## 2014-01-28 DIAGNOSIS — R109 Unspecified abdominal pain: Secondary | ICD-10-CM

## 2014-01-28 DIAGNOSIS — Z90710 Acquired absence of both cervix and uterus: Secondary | ICD-10-CM | POA: Insufficient documentation

## 2014-01-28 DIAGNOSIS — Z79899 Other long term (current) drug therapy: Secondary | ICD-10-CM | POA: Insufficient documentation

## 2014-01-28 DIAGNOSIS — Z8739 Personal history of other diseases of the musculoskeletal system and connective tissue: Secondary | ICD-10-CM | POA: Insufficient documentation

## 2014-01-28 DIAGNOSIS — R1084 Generalized abdominal pain: Secondary | ICD-10-CM | POA: Insufficient documentation

## 2014-01-28 DIAGNOSIS — Z9889 Other specified postprocedural states: Secondary | ICD-10-CM | POA: Insufficient documentation

## 2014-01-28 DIAGNOSIS — R197 Diarrhea, unspecified: Secondary | ICD-10-CM | POA: Insufficient documentation

## 2014-01-28 DIAGNOSIS — R112 Nausea with vomiting, unspecified: Secondary | ICD-10-CM | POA: Insufficient documentation

## 2014-01-28 DIAGNOSIS — F329 Major depressive disorder, single episode, unspecified: Secondary | ICD-10-CM | POA: Insufficient documentation

## 2014-01-28 DIAGNOSIS — Z8742 Personal history of other diseases of the female genital tract: Secondary | ICD-10-CM | POA: Insufficient documentation

## 2014-01-28 DIAGNOSIS — Z87891 Personal history of nicotine dependence: Secondary | ICD-10-CM | POA: Insufficient documentation

## 2014-01-28 DIAGNOSIS — Z8619 Personal history of other infectious and parasitic diseases: Secondary | ICD-10-CM | POA: Insufficient documentation

## 2014-01-28 DIAGNOSIS — F3289 Other specified depressive episodes: Secondary | ICD-10-CM | POA: Insufficient documentation

## 2014-01-28 DIAGNOSIS — K219 Gastro-esophageal reflux disease without esophagitis: Secondary | ICD-10-CM | POA: Insufficient documentation

## 2014-01-28 DIAGNOSIS — K5900 Constipation, unspecified: Secondary | ICD-10-CM | POA: Insufficient documentation

## 2014-01-28 LAB — COMPREHENSIVE METABOLIC PANEL
ALK PHOS: 84 U/L (ref 39–117)
ALT: 17 U/L (ref 0–35)
AST: 24 U/L (ref 0–37)
Albumin: 3.7 g/dL (ref 3.5–5.2)
BILIRUBIN TOTAL: 0.2 mg/dL — AB (ref 0.3–1.2)
BUN: 23 mg/dL (ref 6–23)
CALCIUM: 8.8 mg/dL (ref 8.4–10.5)
CHLORIDE: 105 meq/L (ref 96–112)
CO2: 25 mEq/L (ref 19–32)
Creatinine, Ser: 0.91 mg/dL (ref 0.50–1.10)
GFR, EST AFRICAN AMERICAN: 75 mL/min — AB (ref >90)
GFR, EST NON AFRICAN AMERICAN: 64 mL/min — AB (ref >90)
GLUCOSE: 111 mg/dL — AB (ref 70–99)
Potassium: 4.2 mEq/L (ref 3.7–5.3)
Sodium: 141 mEq/L (ref 137–147)
Total Protein: 7.3 g/dL (ref 6.0–8.3)

## 2014-01-28 LAB — CBC WITH DIFFERENTIAL/PLATELET
BASOS ABS: 0 10*3/uL (ref 0.0–0.1)
BASOS PCT: 0 % (ref 0–1)
EOS ABS: 0.1 10*3/uL (ref 0.0–0.7)
Eosinophils Relative: 1 % (ref 0–5)
HCT: 38.5 % (ref 36.0–46.0)
HEMOGLOBIN: 13.1 g/dL (ref 12.0–15.0)
Lymphocytes Relative: 8 % — ABNORMAL LOW (ref 12–46)
Lymphs Abs: 0.5 10*3/uL — ABNORMAL LOW (ref 0.7–4.0)
MCH: 30 pg (ref 26.0–34.0)
MCHC: 34 g/dL (ref 30.0–36.0)
MCV: 88.1 fL (ref 78.0–100.0)
Monocytes Absolute: 0.2 10*3/uL (ref 0.1–1.0)
Monocytes Relative: 4 % (ref 3–12)
NEUTROS PCT: 87 % — AB (ref 43–77)
Neutro Abs: 5.3 10*3/uL (ref 1.7–7.7)
Platelets: 231 10*3/uL (ref 150–400)
RBC: 4.37 MIL/uL (ref 3.87–5.11)
RDW: 12.9 % (ref 11.5–15.5)
WBC: 6.1 10*3/uL (ref 4.0–10.5)

## 2014-01-28 LAB — URINALYSIS, ROUTINE W REFLEX MICROSCOPIC
Bilirubin Urine: NEGATIVE
Glucose, UA: NEGATIVE mg/dL
Nitrite: NEGATIVE
PROTEIN: NEGATIVE mg/dL
Specific Gravity, Urine: 1.01 (ref 1.005–1.030)
UROBILINOGEN UA: 0.2 mg/dL (ref 0.0–1.0)
pH: 6.5 (ref 5.0–8.0)

## 2014-01-28 LAB — URINE MICROSCOPIC-ADD ON

## 2014-01-28 LAB — LIPASE, BLOOD: LIPASE: 36 U/L (ref 11–59)

## 2014-01-28 MED ORDER — METOCLOPRAMIDE HCL 5 MG/ML IJ SOLN
10.0000 mg | Freq: Once | INTRAMUSCULAR | Status: AC
  Administered 2014-01-28: 10 mg via INTRAVENOUS
  Filled 2014-01-28: qty 2

## 2014-01-28 MED ORDER — MORPHINE SULFATE 4 MG/ML IJ SOLN
4.0000 mg | Freq: Once | INTRAMUSCULAR | Status: AC
  Administered 2014-01-28: 4 mg via INTRAVENOUS
  Filled 2014-01-28: qty 1

## 2014-01-28 MED ORDER — MORPHINE SULFATE 4 MG/ML IJ SOLN
8.0000 mg | Freq: Once | INTRAMUSCULAR | Status: AC
  Administered 2014-01-28: 8 mg via INTRAVENOUS
  Filled 2014-01-28: qty 2

## 2014-01-28 MED ORDER — ONDANSETRON HCL 8 MG PO TABS: 8.0000 mg | ORAL_TABLET | Freq: Three times a day (TID) | ORAL | Status: DC | PRN

## 2014-01-28 MED ORDER — ONDANSETRON HCL 4 MG/2ML IJ SOLN
4.0000 mg | Freq: Once | INTRAMUSCULAR | Status: AC
  Administered 2014-01-28: 4 mg via INTRAVENOUS
  Filled 2014-01-28: qty 2

## 2014-01-28 MED ORDER — IOHEXOL 300 MG/ML  SOLN: 50.0000 mL | Freq: Once | INTRAMUSCULAR | Status: DC | PRN

## 2014-01-28 MED ORDER — IOHEXOL 300 MG/ML  SOLN
100.0000 mL | Freq: Once | INTRAMUSCULAR | Status: AC | PRN
  Administered 2014-01-28: 100 mL via INTRAVENOUS

## 2014-01-28 MED ORDER — SODIUM CHLORIDE 0.9 % IV BOLUS (SEPSIS)
1000.0000 mL | Freq: Once | INTRAVENOUS | Status: AC
  Administered 2014-01-28: 1000 mL via INTRAVENOUS

## 2014-01-28 MED ORDER — IOHEXOL 300 MG/ML  SOLN
50.0000 mL | Freq: Once | INTRAMUSCULAR | Status: AC | PRN
  Administered 2014-01-28: 50 mL via ORAL

## 2014-01-28 NOTE — ED Provider Notes (Signed)
CSN: [id number]     Arrival date & time 01/28/14  1047 History  This chart was scribed for Megan Cable, MD by Elby Beck, ED Scribe. This patient was seen in room APA10/APA10 and the patient's care was started at 11:36 AM.   Chief Complaint  Patient presents with  . Abdominal Pain    Patient is a 68 y.o. female presenting with abdominal pain. The history is provided by the patient. No language interpreter was used.  Abdominal Pain Pain location:  Generalized (worst under diaphragm bilaterally) Pain quality: sharp   Pain radiates to:  Does not radiate Pain severity:  Severe Onset quality:  Gradual Duration:  1 day Timing:  Constant Progression:  Worsening Chronicity:  New Context: previous surgery   Relieved by:  None tried Worsened by:  Nothing tried Ineffective treatments:  None tried Associated symptoms: belching, constipation, diarrhea, nausea and vomiting   Associated symptoms: no chest pain, no dysuria, no fever, no hematemesis, no hematochezia and no shortness of breath   Risk factors: multiple surgeries     HPI Comments: [name] is a 69 y.o. female who presents to the Emergency Department complaining of progressively worsening, generalized abdominal pain onset this morning. She states that the pain is worst under her diaphragm, bilaterally. She also states that she had brief, sharp LLQ pain this morning. She states that she has had associated nausea for past 2 days. She reports an episode of non-bloody emesis this morning. She states that she has not had additional episodes of emesis, but that she has been belching and regurgitating small amounts of food since. She also reports intermittent constipation and diarrhea this morning, each without blood. She states that she has a history of "9 abdominal surgeries", most recently in in 1990s. She denies chest pain, SOB, dysuria or any other symptoms.    Past Medical History  Diagnosis Date  . Chest pain   .  Gastroesophageal reflux disease   . Genital herpes   . Endometriosis   . Degenerative joint disease   . Osteopenia   . Depression   . Migraines     Ceased at menopause   Past Surgical History  Procedure Laterality Date  . Cesarean section  1968  . Colonoscopy  2000    incomplete due to pain  . Salpingoophorectomy  1971  . Abdominal hysterectomy      Single ovary remained in place.  . Cataract extraction  2012    bilateral; with lens implant  . Rotator cuff repair  2013    Left  . Bunionectomy  2007    Bilateral  . Abdominal surgery    . Exploratory laparotomy     Family History  Problem Relation Age of Onset  . COPD Mother   . Breast cancer Mother   . Valvular heart disease Mother     aortic valve replacement  . Macular degeneration Mother   . Parkinson's disease Father   . Stroke Paternal Grandfather    History  Substance Use Topics  . Smoking status: Former Smoker -- 0.50 packs/day for 2 years    Types: Cigarettes    Start date: 01/27/1977    Quit date: 01/27/1979  . Smokeless tobacco: Never Used  . Alcohol Use: No   OB History   Grav Para Term Preterm Abortions TAB SAB Ect Mult Living                 Review of Systems  Constitutional: Negative for fever.  Respiratory: Negative for shortness of breath.   Cardiovascular: Negative for chest pain.  Gastrointestinal: Positive for nausea, vomiting, abdominal pain, diarrhea and constipation. Negative for hematochezia and hematemesis.  Genitourinary: Negative for dysuria.  All other systems reviewed and are negative.   Allergies  Fentanyl and related; Hymenoptera venom preparations; and Versed  Home Medications   Current Outpatient Rx  Name  Route  Sig  Dispense  Refill  . acyclovir (ZOVIRAX) 400 MG tablet   Oral   Take 1 tablet (400 mg total) by mouth daily.   30 tablet   11   . aspirin EC 325 MG tablet   Oral   Take 1-2 tablets (325-650 mg total) by mouth every 6 (six) hours as needed for pain.    30 tablet   0   . citalopram (CELEXA) 40 MG tablet   Oral   Take 1 tablet (40 mg total) by mouth daily.   30 tablet   11   . ibandronate (BONIVA) 150 MG tablet   Oral   Take 150 mg by mouth every 30 (thirty) days. Take in the morning with a full glass of water, on an empty stomach, and do not take anything else by mouth or lie down for the next 30 min.         Marland Kitchen omeprazole (PRILOSEC) 20 MG capsule   Oral   Take 1 capsule (20 mg total) by mouth daily.   30 capsule   11   . conjugated estrogens (PREMARIN) vaginal cream   Vaginal   Place 1 g vaginally 2 (two) times a week.          Triage Vitals: BP 121/72  Pulse 115  Temp(Src) 98.1 F (36.7 C) (Oral)  Resp 22  Ht 5\' 3"  (1.6 m)  Wt 150 lb (68.04 kg)  BMI 26.58 kg/m2  SpO2 98%  Physical Exam  Nursing note and vitals reviewed. CONSTITUTIONAL: Well developed/well nourished HEAD: Normocephalic/atraumatic EYES: EOMI/PERRL ENMT: Mucous membranes moist NECK: supple no meningeal signs CV: S1/S2 noted, no murmurs/rubs/gallops noted LUNGS: Lungs are clear to auscultation bilaterally, no apparent distress ABDOMEN: Soft; diffuse, severe tenderness with guarding GU:no cva tenderness NEURO: Pt is awake/alert, moves all extremitiesx4 EXTREMITIES: pulses normal, full ROM SKIN: warm, color normal PSYCH: anxious, uncomfortable appearing  ED Course  Procedures   DIAGNOSTIC STUDIES: Oxygen Saturation is 98% on RA, normal by my interpretation.    COORDINATION OF CARE: 11:42 AM- Will obtain an X-ray of pt's abdomen along with diagnostic lab work. Will also order medications in the ED. Pt advised of plan for treatment and pt agrees.  1:10 PM Acute abd series negative for acute disease Will proceed with CT imaging given her exam 3:26 PM Pt is feeling improved No further vomiting Abdomen is soft and no guarding on repeat exam CT imaging does not reveal any acute process I feel she is safe/stable for d/c home Will refer to  GI as outpatient She requests med for nausea at home It has been >2.5 hrs since last dose of pain meds Will monitor for another half hour then d/c home  Medications  ondansetron (ZOFRAN) injection 4 mg (not administered)  morphine 4 MG/ML injection 4 mg (not administered)  sodium chloride 0.9 % bolus 1,000 mL (not administered)   Labs Review Labs Reviewed  CBC WITH DIFFERENTIAL - Abnormal; Notable for the following:    Neutrophils Relative % 87 (*)    Lymphocytes Relative 8 (*)    Lymphs Abs 0.5 (*)  All other components within normal limits  COMPREHENSIVE METABOLIC PANEL  LIPASE, BLOOD  URINALYSIS, ROUTINE W REFLEX MICROSCOPIC   Imaging Review No results found.  EKG Interpretation    Date/Time:  Friday January 28 2014 11:49:20 EST Ventricular Rate:  100 PR Interval:  132 QRS Duration: 74 QT Interval:  326 QTC Calculation: 420 R Axis:   75 Text Interpretation:  Normal sinus rhythm Nonspecific ST and T wave abnormality Abnormal ECG No previous ECGs available Confirmed by Christy Gentles  MD, [place] [phone]) on 01/28/2014 12:02:14 PM            MDM  No diagnosis found. Nursing notes including past medical history and social history reviewed and considered in documentation Labs/vital reviewed and considered xrays reviewed and considered    I personally performed the services described in this documentation, which was scribed in my presence. The recorded information has been reviewed and is accurate.      Megan Cable, MD 01/28/14 [phone]

## 2014-01-28 NOTE — Discharge Instructions (Signed)

## 2014-01-28 NOTE — ED Notes (Signed)
Pt c/o nausea 2 days ago, vomited x 1 of food from supper yesterday this morning with abd pain, also c/o belching a lot today, hx of constipation and diarrhea, hx of "complex abdomen surgery", LBM today-started out hard stool then to diarrhea

## 2014-01-28 NOTE — ED Notes (Signed)
Patient c/o generalized abd pain with vomiting this morning. Patient reports constant belching. Per patient has complex abd surgical hx. Patient reports alternation between constipation and diarrhea before abd pain started. Patient reports "thin BM." Denis any fevers or urinary symptoms.

## 2014-02-24 ENCOUNTER — Ambulatory Visit: Payer: Medicare Other | Admitting: Gastroenterology

## 2014-03-23 ENCOUNTER — Encounter: Payer: Self-pay | Admitting: Gastroenterology

## 2014-03-23 ENCOUNTER — Other Ambulatory Visit: Payer: Self-pay | Admitting: Gastroenterology

## 2014-03-23 ENCOUNTER — Ambulatory Visit (INDEPENDENT_AMBULATORY_CARE_PROVIDER_SITE_OTHER): Payer: Medicare Other | Admitting: Gastroenterology

## 2014-03-23 ENCOUNTER — Encounter (INDEPENDENT_AMBULATORY_CARE_PROVIDER_SITE_OTHER): Payer: Self-pay

## 2014-03-23 VITALS — BP 121/74 | HR 94 | Temp 98.6°F | Ht 62.0 in | Wt 152.6 lb

## 2014-03-23 DIAGNOSIS — R112 Nausea with vomiting, unspecified: Secondary | ICD-10-CM | POA: Insufficient documentation

## 2014-03-23 DIAGNOSIS — Z1211 Encounter for screening for malignant neoplasm of colon: Secondary | ICD-10-CM

## 2014-03-23 MED ORDER — SOD PICOSULFATE-MAG OX-CIT ACD 10-3.5-12 MG-GM-GM PO PACK: 1.0000 | PACK | ORAL | Status: DC

## 2014-03-23 NOTE — Progress Notes (Signed)
Reminder in epic °

## 2014-03-23 NOTE — Assessment & Plan Note (Signed)
FAMHx; COLON CANCER(SISTER AGE > 60)   TCS WITH PROPOFOL DUE TO FAILED CONSCIOUS SEDATION. NEEDS PEDS SCOPE INITIALLY AND ULTRASLIM SCOPE AVAILABLE. Stanley TRIAL Rolan Lipa

## 2014-03-23 NOTE — Progress Notes (Signed)
Subjective:    Patient ID: Megan Ingram, female    DOB: [birth year], 68 y.o.   MRN: [id number]  [name], NP  HPI OVER PAST YEARS-GUT ISSUES. ALTERNATING CONSTIPATION(4 MOS) AND SML CALIBER STOOL. EVERY TIME SHE URINATED SHE HAD TINY BITS OF STOOL FOR PAST YEAR. HAS AN ABDOMEN IN FULL OF ADHESIONS. FEELS LIKE SHE HAS STRICTURE THAT IS CAUSING THE PROBLEMS. HAD TCS AT AGE 76. THEY WERE UNABLE TO FINISH DUE TO PT UNABLE TO BE ADEQUATELY SEDATED. FOLLOWED BY [employer] [facility]. MOVED HERE 5 YRS AGO AND DOESN'T HAVE A DRIVER. 6 WEEKS AGO HER SISTER 2 WEEKS AGO WAS DIAGNOSED WITH STAGE IV COLON CANCER. LIVES IN [place], [place]. HAS A COUPLE OF NEIGHBORS THAT MAY BE ABLE TO DRIVE HER AND PICK HER UP. IF HAS GAS PAIN OF PAIN IN LUQ AND BEHIND HER BLADDER. IS A RETIRED NEPHROLOGY RN. BMs: Q2-3 DAYS. ABD PAIN-ONCE A WEEK. LASTS UNTIL SHE HAS A BM.  BLOATING: NEVER. PRILOSEC WORKS FOR GERD. PT DENIES FEVER, CHILLS, BRBPR, nausea, vomiting, melena, diarrhea, problems swallowing, problems with sedation, OR heartburn or indigestion.  Past Medical History  Diagnosis Date  . Chest pain   . Gastroesophageal reflux disease   . Genital herpes   . Endometriosis   . Degenerative joint disease   . Osteopenia   . Depression   . Migraines     Ceased at menopause   Past Surgical History  Procedure Laterality Date  . Cesarean section  1968  . Colonoscopy  2000    incomplete due to pain  . Salpingoophorectomy  1971  . Abdominal hysterectomy      Single ovary remained in place.  . Cataract extraction  2012    bilateral; with lens implant  . Rotator cuff repair  2013    Left  . Bunionectomy  2007    Bilateral  . Abdominal surgery    . Exploratory laparotomy     Allergies  Allergen Reactions  . Fentanyl And Related   . Hymenoptera Venom Preparations   . Versed [Midazolam] Rash   Current Outpatient Prescriptions  Medication Sig Dispense Refill  . aspirin EC 325 MG tablet Take 1-2 tablets  (325-650 mg total) by mouth every 6 (six) hours as needed for pain.    . citalopram (CELEXA) 40 MG tablet Take 1 tablet (40 mg total) by mouth daily.    Marland Kitchen ibandronate (BONIVA) 150 MG tablet Take 150 mg by mouth every 30 (thirty) days. Take in the morning with a full glass of water, on an empty stomach, and do not take anything else by mouth or lie down for the next 30 min.    Marland Kitchen omeprazole (PRILOSEC) 20 MG capsule Take 1 capsule (20 mg total) by mouth daily.     Family History  Problem Relation Age of Onset  . COPD Mother   . Breast cancer Mother   . Valvular heart disease Mother     aortic valve replacement  . Macular degeneration Mother   . Parkinson's disease Father   . Stroke Paternal Grandfather   . Colon cancer Sister    History  Substance Use Topics  . Smoking status: Former Smoker -- 0.50 packs/day for 2 years    Types: Cigarettes    Start date: 01/27/1977    Quit date: 01/27/1979  . Smokeless tobacco: Never Used  . Alcohol Use: No       Review of Systems PER HPI OTHERWISE ALL SYSTEMS ARE NEGATIVE.  Objective:   Physical Exam  Vitals reviewed. Constitutional: She is oriented to person, place, and time. She appears well-nourished. No distress.  HENT:  Head: Normocephalic and atraumatic.  Mouth/Throat: Oropharynx is clear and moist. No oropharyngeal exudate.  Eyes: Pupils are equal, round, and reactive to light. No scleral icterus.  Neck: Normal range of motion. Neck supple.  Cardiovascular: Normal rate, regular rhythm and normal heart sounds.   Pulmonary/Chest: Effort normal and breath sounds normal. No respiratory distress.  Abdominal: Soft. Bowel sounds are normal. She exhibits no distension. There is tenderness.  MILD LUQ TTP  Musculoskeletal: She exhibits no edema.  Lymphadenopathy:    She has no cervical adenopathy.  Neurological: She is alert and oriented to person, place, and time.  NO FOCAL DEFICITS   Psychiatric: She has a normal mood and affect.            Assessment & Plan:

## 2014-03-23 NOTE — Assessment & Plan Note (Signed)
SX NOW RESOLVED BU DISTENDED STOMACH WITH REFLUX OF GASTRIC CONTENT INTO THE ESOPHAGUS. I PERSONALLY REVIEWED CT WITH DR Candise Che.  EGD WITH ? PYLORIC CHANNEL DILATION Use Prilosec 30 minutes prior to your first meal. LOW FAT DIET OPV IN 4-6 MOS

## 2014-03-23 NOTE — Patient Instructions (Signed)
ENDOSCOPY NEXT MONTH.  CONTINUE Prilosec 30 minutes prior to your first meal.   FOLLOW A LOW FAT DIET. SEE INFO BELOW.  FOLLOW UP IN 4 TO 6 MOS.   Low-Fat Diet BREADS, CEREALS, PASTA, RICE, DRIED PEAS, AND BEANS These products are high in carbohydrates and most are low in fat. Therefore, they can be increased in the diet as substitutes for fatty foods. They too, however, contain calories and should not be eaten in excess. Cereals can be eaten for snacks as well as for breakfast.   FRUITS AND VEGETABLES It is good to eat fruits and vegetables. Besides being sources of fiber, both are rich in vitamins and some minerals. They help you get the daily allowances of these nutrients. Fruits and vegetables can be used for snacks and desserts.  MEATS Limit lean meat, chicken, [place], and fish to no more than 6 ounces per day. Beef, Pork, and Lamb Use lean cuts of beef, pork, and lamb. Lean cuts include:  Extra-lean ground beef.  Arm roast.  Sirloin tip.  Center-cut ham.  Round steak.  Loin chops.  Rump roast.  Tenderloin.  Trim all fat off the outside of meats before cooking. It is not necessary to severely decrease the intake of red meat, but lean choices should be made. Lean meat is rich in protein and contains a highly absorbable form of iron. Premenopausal women, in particular, should avoid reducing lean red meat because this could increase the risk for low red blood cells (iron-deficiency anemia).  Chicken and [place] These are good sources of protein. The fat of poultry can be reduced by removing the skin and underlying fat layers before cooking. Chicken and [place] can be substituted for lean red meat in the diet. Poultry should not be fried or covered with high-fat sauces. Fish and Shellfish Fish is a good source of protein. Shellfish contain cholesterol, but they usually are low in saturated fatty acids. The preparation of fish is important. Like chicken and [place], they should not be  fried or covered with high-fat sauces. EGGS Egg whites contain no fat or cholesterol. They can be eaten often. Try 1 to 2 egg whites instead of whole eggs in recipes or use egg substitutes that do not contain yolk. MILK AND DAIRY PRODUCTS Use skim or 1% milk instead of 2% or whole milk. Decrease whole milk, natural, and processed cheeses. Use nonfat or low-fat (2%) cottage cheese or low-fat cheeses made from vegetable oils. Choose nonfat or low-fat (1 to 2%) yogurt. Experiment with evaporated skim milk in recipes that call for heavy cream. Substitute low-fat yogurt or low-fat cottage cheese for sour cream in dips and salad dressings. Have at least 2 servings of low-fat dairy products, such as 2 glasses of skim (or 1%) milk each day to help get your daily calcium intake. FATS AND OILS Reduce the total intake of fats, especially saturated fat. Butterfat, lard, and beef fats are high in saturated fat and cholesterol. These should be avoided as much as possible. Vegetable fats do not contain cholesterol, but certain vegetable fats, such as coconut oil, palm oil, and palm kernel oil are very high in saturated fats. These should be limited. These fats are often used in bakery goods, processed foods, popcorn, oils, and nondairy creamers. Vegetable shortenings and some peanut butters contain hydrogenated oils, which are also saturated fats. Read the labels on these foods and check for saturated vegetable oils. Unsaturated vegetable oils and fats do not raise blood cholesterol. However, they should  be limited because they are fats and are high in calories. Total fat should still be limited to 30% of your daily caloric intake. Desirable liquid vegetable oils are corn oil, cottonseed oil, olive oil, canola oil, safflower oil, soybean oil, and sunflower oil. Peanut oil is not as good, but small amounts are acceptable. Buy a heart-healthy tub margarine that has no partially hydrogenated oils in the ingredients. Mayonnaise  and salad dressings often are made from unsaturated fats, but they should also be limited because of their high calorie and fat content. Seeds, nuts, peanut butter, olives, and avocados are high in fat, but the fat is mainly the unsaturated type. These foods should be limited mainly to avoid excess calories and fat. OTHER EATING TIPS Snacks  Most sweets should be limited as snacks. They tend to be rich in calories and fats, and their caloric content outweighs their nutritional value. Some good choices in snacks are graham crackers, melba toast, soda crackers, bagels (no egg), English muffins, fruits, and vegetables. These snacks are preferable to snack crackers, [place] fries, TORTILLA CHIPS, and POTATO chips. Popcorn should be air-popped or cooked in small amounts of liquid vegetable oil. Desserts Eat fruit, low-fat yogurt, and fruit ices instead of pastries, cake, and cookies. Sherbet, angel food cake, gelatin dessert, frozen low-fat yogurt, or other frozen products that do not contain saturated fat (pure fruit juice bars, frozen ice pops) are also acceptable.  COOKING METHODS Choose those methods that use little or no fat. They include: Poaching.  Braising.  Steaming.  Grilling.  Baking.  Stir-frying.  Broiling.  Microwaving.  Foods can be cooked in a nonstick pan without added fat, or use a nonfat cooking spray in regular cookware. Limit fried foods and avoid frying in saturated fat. Add moisture to lean meats by using water, broth, cooking wines, and other nonfat or low-fat sauces along with the cooking methods mentioned above. Soups and stews should be chilled after cooking. The fat that forms on top after a few hours in the refrigerator should be skimmed off. When preparing meals, avoid using excess salt. Salt can contribute to raising blood pressure in some people.  EATING AWAY FROM HOME Order entres, potatoes, and vegetables without sauces or butter. When meat exceeds the size of a deck  of cards (3 to 4 ounces), the rest can be taken home for another meal. Choose vegetable or fruit salads and ask for low-calorie salad dressings to be served on the side. Use dressings sparingly. Limit high-fat toppings, such as bacon, crumbled eggs, cheese, sunflower seeds, and olives. Ask for heart-healthy tub margarine instead of butter.

## 2014-03-24 NOTE — Progress Notes (Signed)
cc'd to pcp 

## 2014-04-13 ENCOUNTER — Encounter (HOSPITAL_COMMUNITY): Payer: Self-pay

## 2014-04-19 ENCOUNTER — Encounter (HOSPITAL_COMMUNITY): Payer: Self-pay

## 2014-04-19 ENCOUNTER — Other Ambulatory Visit (HOSPITAL_COMMUNITY): Payer: Medicare Other

## 2014-04-19 ENCOUNTER — Encounter (HOSPITAL_COMMUNITY)
Admission: RE | Admit: 2014-04-19 | Discharge: 2014-04-19 | Disposition: A | Discharge status: Home / Self Care | Payer: Medicare Other | Source: Ambulatory Visit | Attending: Gastroenterology | Admitting: Gastroenterology

## 2014-04-19 HISTORY — DX: Other specified postprocedural states: Z98.890

## 2014-04-19 HISTORY — DX: Other specified postprocedural states: R11.2

## 2014-04-19 HISTORY — DX: Adverse effect of unspecified anesthetic, initial encounter: T41.45XA

## 2014-04-19 HISTORY — DX: Other complications of anesthesia, initial encounter: T88.59XA

## 2014-04-19 NOTE — Patient Instructions (Addendum)
20    Your procedure is scheduled on: 04/26/2014  Report to [facility] at 6:15    AM.  Call this number if you have problems the morning of surgery: [phone]   Remember:   Do not drink or eat food:After Midnight.    Clear liquids include soda, tea, black coffee, apple or grape juice, broth.  Take these medicines the morning of surgery with A SIP OF WATER: Celexa and prilosec   Do not wear jewelry, make-up or nail polish.  Do not wear lotions, powders, or perfumes. You may wear deodorant.  Do not shave 48 hours prior to surgery. Men may shave face and neck.  Do not bring valuables to the hospital.  Contacts, dentures or bridgework may not be worn into surgery.  Leave suitcase in the car. After surgery it may be brought to your room.  For patients admitted to the hospital, checkout time is 11:00 AM the day of discharge.   Patients discharged the day of surgery will not be allowed to drive home.  Name and phone number of your driver:    Please read over the following fact sheets that you were given: Pain Booklet, Lab Information and Anesthesia Post-op Instructions   Endoscopy Care After Please read the instructions outlined below and refer to this sheet in the next few weeks. These discharge instructions provide you with general information on caring for yourself after you leave the hospital. Your doctor may also give you specific instructions. While your treatment has been planned according to the most current medical practices available, unavoidable complications occasionally occur. If you have any problems or questions after discharge, please call your doctor. HOME CARE INSTRUCTIONS Activity  You may resume your regular activity but move at a slower pace for the next 24 hours.   Take frequent rest periods for the next 24 hours.   Walking will help expel (get rid of) the air and reduce the bloated feeling in your abdomen.   No driving for 24 hours (because of the anesthesia  (medicine) used during the test).   You may shower.   Do not sign any important legal documents or operate any machinery for 24 hours (because of the anesthesia used during the test).  Nutrition  Drink plenty of fluids.   You may resume your normal diet.   Begin with a light meal and progress to your normal diet.   Avoid alcoholic beverages for 24 hours or as instructed by your caregiver.  Medications You may resume your normal medications unless your caregiver tells you otherwise. What you can expect today  You may experience abdominal discomfort such as a feeling of fullness or "gas" pains.   You may experience a sore throat for 2 to 3 days. This is normal. Gargling with salt water may help this.  Follow-up Your doctor will discuss the results of your test with you. SEEK IMMEDIATE MEDICAL CARE IF:  You have excessive nausea (feeling sick to your stomach) and/or vomiting.   You have severe abdominal pain and distention (swelling).   You have trouble swallowing.   You have a temperature over 100 F (37.8 C).   You have rectal bleeding or vomiting of blood.  Document Released: 07/23/2004 Document Revised: 11/28/2011 Document Reviewed: 02/03/2008 Esophageal Dilatation The esophagus is the long, narrow tube which carries food and liquid from the mouth to the stomach. Esophageal dilatation is the technique used to stretch a blocked or narrowed portion of the esophagus. This procedure is used  when a part of the esophagus has become so narrow that it becomes difficult, painful or even impossible to swallow. This is generally an uncomplicated form of treatment. When this is not successful, chest surgery may be required. This is a much more extensive form of treatment with a longer recovery time. CAUSES  Some of the more common causes of blockage or strictures of the esophagus are:  Narrowing from longstanding inflammation (soreness and redness) of the lower esophagus. This comes  from the constant exposure of the lower esophagus to the acid which bubbles up from the stomach. Over time this causes scarring and narrowing of the lower esophagus.  Hiatal hernia in which a small part of the stomach bulges (herniates) up through the diaphragm. This can cause a gradual narrowing of the end of the esophagus.  Schatzki's Ring is a narrow ring of benign (non-cancerous) fibrous tissue which constricts the lower esophagus. The reason for this is not known.  Scleroderma is a connective tissue disorder that affects the esophagus and makes swallowing difficult.  Achalasia is an absence of nerves to the lower esophagus and to the esophageal sphincter. This is the circular muscle between the stomach and esophagus that relaxes to allow food into the stomach. After swallowing, it contracts to keep food in the stomach. This absence of nerves may be congenital (present since birth). This can cause irregular spasms of the lower esophageal muscle. This spasm does not open up to allow food and fluid through. The result is a persistent blockage with subsequent slow trickling of the esophageal contents into the stomach.  Strictures may develop from swallowing materials which damage the esophagus. Some examples are strong acids or alkalis such as lye.  Growths such as benign (non-cancerous) and malignant (cancerous) tumors can block the esophagus.  Heredity (present since birth) causes. DIAGNOSIS  Your caregiver often suspects this problem by taking a medical history. They will also do a physical exam. They can then prove their suspicions using X-rays and endoscopy. Endoscopy is an exam in which a tube like a small flexible telescope is used to look at your esophagus.  TREATMENT There are different stretching (dilating) techniques which can be used. Simple bougie dilatation may be done in the office. This usually takes only a couple minutes. A numbing (anesthetic) spray of the throat is used.  Endoscopy, when done, is done in an endoscopy suite, under mild sedation. When fluoroscopy is used, the procedure is performed in X-ray. Other techniques require a little longer time. Recovery is usually quick. There is no waiting time to begin eating and drinking to test success of the treatment. Following are some of the methods used. Narrowing of the esophagus is treated by making it bigger. Commonly this is a mechanical problem which can be treated with stretching. This can be done in different ways. Your caregiver will discuss these with you. Some of the means used are:  A series of graduated (increasing thickness) flexible dilators can be used. These are weighted tubes passed through the esophagus into the stomach. The tubes used become progressively larger until the desired stretched size is reached. Graduated dilators are a simple and quick way of opening the esophagus. No visualization is required.  Another method is the use of endoscopy to place a flexible wire across the stricture. The endoscope is removed and the wire left in place. A dilator with a hole through it from end to end is guided down the esophagus and across the stricture. One or more  of these dilators are passed over the wire. At the end of the exam, the wire is removed. This type of treatment may be performed in the X-ray department under fluoroscopy. An advantage of this procedure is the examiner is visualizing the end opening in the esophagus.  Stretching of the esophagus may be done using balloons. Deflated balloons are placed through the endoscope and across the stricture. This type of balloon dilatation is often done at the time of endoscopy or fluoroscopy. Flexible endoscopy allows the examiner to directly view the stricture. A balloon is inserted in the deflated form into the area of narrowing. It is then inflated with air to a certain pressure that is pre-set for a given circumference. When inflated, it becomes sausage  shaped, stretched, and makes the stricture larger.  Achalasia requires a longer larger balloon-type dilator. This is frequently done under X-ray control. In this situation, the spastic muscle fibers in the lower esophagus are stretched. All of the above procedures make the passage of food and water into the stomach easier. They also make it easier for stomach contents to reflux back into the esophagus. Special medications may be used following the procedure to help prevent further stricturing. Proton-pump inhibitor medications are good at decreasing the amount of acid in the stomach juice. When stomach juice refluxes into the esophagus, the juice is no longer as acidic and is less likely to burn or scar the esophagus. RISKS AND COMPLICATIONS Esophageal dilatation is usually performed effectively and without problems. Some complications that can occur are:  A small amount of bleeding almost always happens where the stretching takes place. If this is too excessive it may require more aggressive treatment.  An uncommon complication is perforation (making a hole) of the esophagus. The esophagus is thin. It is easy to make a hole in it. If this happens, an operation may be necessary to repair this.  A small, undetected perforation could lead to an infection in the chest. This can be very serious. HOME CARE INSTRUCTIONS   If you received sedation for your procedure, do not drive, make important decisions, or perform any activities requiring your full coordination. Do not drink alcohol, take sedatives, or use any mind altering chemicals unless instructed by your caregiver.  You may use throat lozenges or warm salt water gargles if you have throat discomfort  You can begin eating and drinking normally on return home unless instructed otherwise. Do not purposely try to force large chunks of food down to test the benefits of your procedure.  Mild discomfort can be eased with sips of ice  water.  Medications for discomfort may or may not be needed. SEEK IMMEDIATE MEDICAL CARE IF:   You begin vomiting up blood.  You develop black tarry stools  You develop chills or an unexplained temperature of over 101 F (38.3 C)  You develop chest or abdominal pain.  You develop shortness of breath or feel lightheaded or faint.  Your swallowing is becoming more painful, difficult, or you are unable to swallow. MAKE SURE YOU:   Understand these instructions.  Will watch your condition.  Will get help right away if you are not doing well or get worse. Document Released: 01/30/2006 Document Revised: 03/02/2012 Document Reviewed: 03/19/2006 [facility] Patient Information 2014 [place]. Colonoscopy A colonoscopy is an exam to look at the entire large intestine (colon). This exam can help find problems such as tumors, polyps, inflammation, and areas of bleeding. The exam takes about 1 hour.  LET  YOUR HEALTH CARE PROVIDER KNOW ABOUT:   Any allergies you have.  All medicines you are taking, including vitamins, herbs, eye drops, creams, and over-the-counter medicines.  Previous problems you or members of your family have had with the use of anesthetics.  Any blood disorders you have.  Previous surgeries you have had.  Medical conditions you have. RISKS AND COMPLICATIONS  Generally, this is a safe procedure. However, as with any procedure, complications can occur. Possible complications include:  Bleeding.  Tearing or rupture of the colon wall.  Reaction to medicines given during the exam.  Infection (rare). BEFORE THE PROCEDURE   Ask your health care provider about changing or stopping your regular medicines.  You may be prescribed an oral bowel prep. This involves drinking a large amount of medicated liquid, starting the day before your procedure. The liquid will cause you to have multiple loose stools until your stool is almost clear or light green. This cleans  out your colon in preparation for the procedure.  Do not eat or drink anything else once you have started the bowel prep, unless your health care provider tells you it is safe to do so.  Arrange for someone to drive you home after the procedure. PROCEDURE   You will be given medicine to help you relax (sedative).  You will lie on your side with your knees bent.  A long, flexible tube with a light and camera on the end (colonoscope) will be inserted through the rectum and into the colon. The camera sends video back to a computer screen as it moves through the colon. The colonoscope also releases carbon dioxide gas to inflate the colon. This helps your health care provider see the area better.  During the exam, your health care provider may take a small tissue sample (biopsy) to be examined under a microscope if any abnormalities are found.  The exam is finished when the entire colon has been viewed. AFTER THE PROCEDURE   Do not drive for 24 hours after the exam.  You may have a small amount of blood in your stool.  You may pass moderate amounts of gas and have mild abdominal cramping or bloating. This is caused by the gas used to inflate your colon during the exam.  Ask when your test results will be ready and how you will get your results. Make sure you get your test results. Document Released: 12/06/2000 Document Revised: 09/29/2013 Document Reviewed: 08/16/2013 [facility] Patient Information 2014 [place].

## 2014-04-19 NOTE — Pre-Procedure Instructions (Signed)
Patient given information to look at my chart at home.

## 2014-04-25 ENCOUNTER — Telehealth (INDEPENDENT_AMBULATORY_CARE_PROVIDER_SITE_OTHER): Payer: Self-pay | Admitting: Internal Medicine

## 2014-04-25 NOTE — Telephone Encounter (Signed)
Patient called earlier today stating that she had taken half of prepopic and her bowels had not moved. She was drinking a lot of fluids and having diuresis. Patient was advised to finish prep. She can use fleets enema if bowels do not start moving soon.

## 2014-04-26 ENCOUNTER — Encounter (HOSPITAL_COMMUNITY)
Admission: RE | Disposition: A | Discharge status: Home / Self Care | Payer: Self-pay | Source: Ambulatory Visit | Attending: Gastroenterology

## 2014-04-26 ENCOUNTER — Encounter (HOSPITAL_COMMUNITY): Payer: Self-pay | Admitting: Anesthesiology

## 2014-04-26 ENCOUNTER — Ambulatory Visit (HOSPITAL_COMMUNITY): Payer: Medicare Other | Admitting: Anesthesiology

## 2014-04-26 ENCOUNTER — Ambulatory Visit: Admit: 2014-04-26 | Payer: Self-pay | Admitting: Gastroenterology

## 2014-04-26 ENCOUNTER — Ambulatory Visit (HOSPITAL_COMMUNITY)
Admission: RE | Admit: 2014-04-26 | Discharge: 2014-04-26 | Disposition: A | Discharge status: Home / Self Care | Payer: Medicare Other | Source: Ambulatory Visit | Attending: Gastroenterology | Admitting: Gastroenterology

## 2014-04-26 ENCOUNTER — Encounter (HOSPITAL_COMMUNITY): Payer: Medicare Other | Admitting: Anesthesiology

## 2014-04-26 DIAGNOSIS — K621 Rectal polyp: Secondary | ICD-10-CM | POA: Diagnosis not present

## 2014-04-26 DIAGNOSIS — K449 Diaphragmatic hernia without obstruction or gangrene: Secondary | ICD-10-CM | POA: Insufficient documentation

## 2014-04-26 DIAGNOSIS — R112 Nausea with vomiting, unspecified: Secondary | ICD-10-CM

## 2014-04-26 DIAGNOSIS — K2960 Other gastritis without bleeding: Secondary | ICD-10-CM | POA: Insufficient documentation

## 2014-04-26 DIAGNOSIS — R1013 Epigastric pain: Secondary | ICD-10-CM | POA: Diagnosis present

## 2014-04-26 DIAGNOSIS — K620 Anal polyp: Secondary | ICD-10-CM | POA: Diagnosis not present

## 2014-04-26 DIAGNOSIS — Z1211 Encounter for screening for malignant neoplasm of colon: Secondary | ICD-10-CM | POA: Diagnosis not present

## 2014-04-26 DIAGNOSIS — K3189 Other diseases of stomach and duodenum: Secondary | ICD-10-CM | POA: Diagnosis not present

## 2014-04-26 DIAGNOSIS — K648 Other hemorrhoids: Secondary | ICD-10-CM | POA: Insufficient documentation

## 2014-04-26 DIAGNOSIS — K5730 Diverticulosis of large intestine without perforation or abscess without bleeding: Secondary | ICD-10-CM | POA: Diagnosis not present

## 2014-04-26 DIAGNOSIS — Z79899 Other long term (current) drug therapy: Secondary | ICD-10-CM | POA: Insufficient documentation

## 2014-04-26 DIAGNOSIS — Z7982 Long term (current) use of aspirin: Secondary | ICD-10-CM | POA: Diagnosis not present

## 2014-04-26 DIAGNOSIS — D131 Benign neoplasm of stomach: Secondary | ICD-10-CM | POA: Diagnosis not present

## 2014-04-26 DIAGNOSIS — D126 Benign neoplasm of colon, unspecified: Secondary | ICD-10-CM

## 2014-04-26 DIAGNOSIS — K2980 Duodenitis without bleeding: Secondary | ICD-10-CM | POA: Insufficient documentation

## 2014-04-26 DIAGNOSIS — K222 Esophageal obstruction: Secondary | ICD-10-CM | POA: Diagnosis not present

## 2014-04-26 DIAGNOSIS — K219 Gastro-esophageal reflux disease without esophagitis: Secondary | ICD-10-CM

## 2014-04-26 HISTORY — PX: COLONOSCOPY WITH PROPOFOL: SHX5780

## 2014-04-26 HISTORY — PX: ESOPHAGOGASTRODUODENOSCOPY (EGD) WITH PROPOFOL: SHX5813

## 2014-04-26 HISTORY — PX: SAVORY DILATION: SHX5439

## 2014-04-26 HISTORY — PX: POLYPECTOMY: SHX5525

## 2014-04-26 HISTORY — PX: BIOPSY: SHX5522

## 2014-04-26 SURGERY — COLONOSCOPY WITH PROPOFOL
Anesthesia: Monitor Anesthesia Care | Site: Rectum

## 2014-04-26 SURGERY — COLONOSCOPY WITH PROPOFOL
Anesthesia: Monitor Anesthesia Care

## 2014-04-26 MED ORDER — PROPOFOL 10 MG/ML IV BOLUS
INTRAVENOUS | Status: AC
  Filled 2014-04-26: qty 20

## 2014-04-26 MED ORDER — PROPOFOL INFUSION 10 MG/ML OPTIME
INTRAVENOUS | Status: DC | PRN
  Administered 2014-04-26: 125 ug/kg/min via INTRAVENOUS

## 2014-04-26 MED ORDER — GLYCOPYRROLATE 0.2 MG/ML IJ SOLN
INTRAMUSCULAR | Status: AC
  Filled 2014-04-26: qty 1

## 2014-04-26 MED ORDER — OMEPRAZOLE 20 MG PO CPDR: DELAYED_RELEASE_CAPSULE | ORAL | Status: DC

## 2014-04-26 MED ORDER — ONDANSETRON HCL 4 MG/2ML IJ SOLN: 4.0000 mg | Freq: Once | INTRAMUSCULAR | Status: DC | PRN

## 2014-04-26 MED ORDER — ONDANSETRON HCL 4 MG/2ML IJ SOLN
4.0000 mg | Freq: Once | INTRAMUSCULAR | Status: AC
  Administered 2014-04-26: 4 mg via INTRAVENOUS

## 2014-04-26 MED ORDER — DEXAMETHASONE SODIUM PHOSPHATE 4 MG/ML IJ SOLN
INTRAMUSCULAR | Status: AC
  Filled 2014-04-26: qty 1

## 2014-04-26 MED ORDER — HYDROMORPHONE HCL PF 1 MG/ML IJ SOLN
0.5000 mg | INTRAMUSCULAR | Status: AC | PRN
  Administered 2014-04-26 (×2): 0.5 mg via INTRAVENOUS

## 2014-04-26 MED ORDER — LIDOCAINE VISCOUS 2 % MT SOLN
OROMUCOSAL | Status: AC
  Filled 2014-04-26: qty 15

## 2014-04-26 MED ORDER — LACTATED RINGERS IV SOLN
INTRAVENOUS | Status: DC
  Administered 2014-04-26: 1000 mL via INTRAVENOUS

## 2014-04-26 MED ORDER — GLYCOPYRROLATE 0.2 MG/ML IJ SOLN
0.2000 mg | Freq: Once | INTRAMUSCULAR | Status: AC
  Administered 2014-04-26: 0.2 mg via INTRAVENOUS

## 2014-04-26 MED ORDER — LORAZEPAM 2 MG/ML IJ SOLN
INTRAMUSCULAR | Status: AC
  Filled 2014-04-26: qty 1

## 2014-04-26 MED ORDER — ONDANSETRON HCL 4 MG/2ML IJ SOLN
INTRAMUSCULAR | Status: AC
  Filled 2014-04-26: qty 2

## 2014-04-26 MED ORDER — LORAZEPAM 2 MG/ML IJ SOLN
1.0000 mg | Freq: Once | INTRAMUSCULAR | Status: AC
  Administered 2014-04-26: 0.5 mg via INTRAVENOUS

## 2014-04-26 MED ORDER — HYDROMORPHONE HCL PF 1 MG/ML IJ SOLN
INTRAMUSCULAR | Status: AC
  Filled 2014-04-26: qty 1

## 2014-04-26 MED ORDER — STERILE WATER FOR IRRIGATION IR SOLN
Status: DC | PRN
  Administered 2014-04-26: 08:00:00

## 2014-04-26 MED ORDER — LIDOCAINE HCL (PF) 1 % IJ SOLN
INTRAMUSCULAR | Status: AC
  Filled 2014-04-26: qty 5

## 2014-04-26 MED ORDER — DEXAMETHASONE SODIUM PHOSPHATE 4 MG/ML IJ SOLN
4.0000 mg | Freq: Once | INTRAMUSCULAR | Status: AC
  Administered 2014-04-26: 4 mg via INTRAVENOUS

## 2014-04-26 MED ORDER — LIDOCAINE VISCOUS 2 % MT SOLN
OROMUCOSAL | Status: DC | PRN
  Administered 2014-04-26: 4 mL via OROMUCOSAL

## 2014-04-26 MED ORDER — WATER FOR IRRIGATION, STERILE IR SOLN
Status: DC | PRN
  Administered 2014-04-26: 1000 mL

## 2014-04-26 SURGICAL SUPPLY — 26 items
BLOCK BITE 60FR ADLT L/F BLUE (MISCELLANEOUS) ×4 IMPLANT
ELECT REM PT RETURN 9FT ADLT (ELECTROSURGICAL) ×4
ELECTRODE REM PT RTRN 9FT ADLT (ELECTROSURGICAL) ×2 IMPLANT
FCP BXJMBJMB 240X2.8X (CUTTING FORCEPS)
FLOOR PAD 36X40 (MISCELLANEOUS) ×4
FORCEPS BIOP RAD 4 LRG CAP 4 (CUTTING FORCEPS) ×8 IMPLANT
FORCEPS BIOP RJ4 240 W/NDL (CUTTING FORCEPS)
FORCEPS BXJMBJMB 240X2.8X (CUTTING FORCEPS) IMPLANT
FORMALIN 10 PREFIL 20ML (MISCELLANEOUS) ×12 IMPLANT
INJECTOR/SNARE I SNARE (MISCELLANEOUS) ×4 IMPLANT
KIT CLEAN ENDO COMPLIANCE (KITS) ×4 IMPLANT
LUBRICANT JELLY 4.5OZ STERILE (MISCELLANEOUS) ×4 IMPLANT
MANIFOLD NEPTUNE II (INSTRUMENTS) ×4 IMPLANT
NEEDLE SCLEROTHERAPY 25GX240 (NEEDLE) IMPLANT
PAD FLOOR 36X40 (MISCELLANEOUS) ×2 IMPLANT
PROBE APC STR FIRE (PROBE) IMPLANT
PROBE INJECTION GOLD (MISCELLANEOUS)
PROBE INJECTION GOLD 7FR (MISCELLANEOUS) IMPLANT
ROTH PLATINUM NET UNIVERSAL (MISCELLANEOUS) ×4 IMPLANT
SNARE ROTATE MED OVAL 20MM (MISCELLANEOUS) IMPLANT
SYR 50ML LL SCALE MARK (SYRINGE) ×8 IMPLANT
SYR INFLATION 60ML (SYRINGE) IMPLANT
TRAP SPECIMEN MUCOUS 40CC (MISCELLANEOUS) IMPLANT
TUBING ENDO SMARTCAP PENTAX (MISCELLANEOUS) ×4 IMPLANT
TUBING IRRIGATION ENDOGATOR (MISCELLANEOUS) ×4 IMPLANT
WATER STERILE IRR 1000ML POUR (IV SOLUTION) ×8 IMPLANT

## 2014-04-26 NOTE — H&P (Signed)
Primary Care Physician:  [name], NP Primary Gastroenterologist:  Dr. Oneida Alar  Pre-Procedure History & Physical: HPI:  Megan Ingram is a 68 y.o. female here for SCREENING/dyspepsia.  Past Medical History  Diagnosis Date  . Chest pain   . Gastroesophageal reflux disease   . Genital herpes   . Endometriosis   . Degenerative joint disease   . Osteopenia   . Depression   . Migraines     Ceased at menopause  . Complication of anesthesia   . PONV (postoperative nausea and vomiting)     Past Surgical History  Procedure Laterality Date  . Cesarean section  1968  . Colonoscopy  2000    incomplete due to pain  . Salpingoophorectomy  1971  . Abdominal hysterectomy      Single ovary remained in place.  . Cataract extraction  2012    bilateral; with lens implant  . Rotator cuff repair  2013    Left  . Bunionectomy  2007    Bilateral  . Abdominal surgery    . Exploratory laparotomy      ovarian ectopic pregnancy    Prior to Admission medications   Medication Sig Start Date End Date Taking? Authorizing Provider  aspirin EC 325 MG tablet Take 1-2 tablets (325-650 mg total) by mouth every 6 (six) hours as needed for pain. 01/28/13  Yes Yehuda Savannah, MD  citalopram (CELEXA) 40 MG tablet Take 1 tablet (40 mg total) by mouth daily. 01/28/13  Yes Yehuda Savannah, MD  omeprazole (PRILOSEC) 20 MG capsule Take 1 capsule (20 mg total) by mouth daily. 01/28/13  Yes Yehuda Savannah, MD    Allergies as of 03/23/2014 - Review Complete 03/23/2014  Allergen Reaction Noted  . Fentanyl and related  01/27/2013  . Hymenoptera venom preparations  01/29/2013  . Versed [midazolam] Rash 01/27/2013    Family History  Problem Relation Age of Onset  . COPD Mother   . Breast cancer Mother   . Valvular heart disease Mother     aortic valve replacement  . Macular degeneration Mother   . Parkinson's disease Father   . Stroke Paternal Grandfather   . Colon cancer Sister      History   Social History  . Marital Status: Single    Spouse Name: N/A    Number of Children: N/A  . Years of Education: N/A   Occupational History  . 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 Smoking status: Former Smoker -- 0.50 packs/day for 2 years    Types: Cigarettes    Start date: 01/27/1977    Quit date: 01/27/1979  . Smokeless tobacco: Never Used  . Alcohol Use: No  . Drug Use: No  . Sexual Activity: Yes    Birth Control/ Protection: Surgical   Other Topics Concern  . Not on file   Social History Narrative  . No narrative on file    Review of Systems: See HPI, otherwise negative ROS   Physical Exam: BP 132/81  Pulse 81  Temp(Src) 98.2 F (36.8 C) (Oral)  Resp 20  SpO2 100% General:   Alert,  pleasant and cooperative in NAD Head:  Normocephalic and atraumatic. Neck:  Supple; Lungs:  Clear throughout to auscultation.    Heart:  Regular rate and rhythm. Abdomen:  Soft, nontender and nondistended. Normal bowel sounds, without guarding, and without rebound.   Neurologic:  Alert and  oriented x4;  grossly normal neurologically.  Impression/Plan:    SCREENING/dyspepsia  Plan:  1. TCS/EGD/pyloric channel dilation TODAY

## 2014-04-26 NOTE — Anesthesia Postprocedure Evaluation (Addendum)
  Anesthesia Post-op Note  Patient: Megan Ingram  Procedure(s) Performed: Procedure(s) with comments: COLONOSCOPY WITH PROPOFOL (N/A) - in cecum at 0810 out at 0839 = 29 total minutes ESOPHAGOGASTRODUODENOSCOPY (EGD) WITH PROPOFOL (N/A) SAVORY DILATION (N/A) - 14/15/16 POLYPECTOMY (N/A) GASTRIC BIOPSY (N/A)  Patient Location: PACU  Anesthesia Type:MAC  Level of Consciousness: awake, alert  and oriented  Airway and Oxygen Therapy: Patient Spontanous Breathing and Patient connected to face mask oxygen  Post-op Pain: none  Post-op Assessment: Post-op Vital signs reviewed, Patient's Cardiovascular Status Stable, Respiratory Function Stable, Patent Airway and No signs of Nausea or vomiting  Post-op Vital Signs: Reviewed and stable  Last Vitals:  Filed Vitals:   04/26/14 0730  BP: 142/99  Pulse:   Temp:   Resp: 30    Complications: No apparent anesthesia complications  Temp 18.8 post op

## 2014-04-26 NOTE — Discharge Instructions (Signed)
YOUR NAUSEA AND VOMITING WERE MOST LIKELY DUE TO REFLUX, GASTRITIS/DUODENITIS, AND POSSIBLY A VIRAL ILLNESS. You had 3 small polyps removed. You have SMALL internal hemorrhoids and diverticulosis IN YOUR LEFT COLON. 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 You have EROSIVE gastritis. I biopsied your stomach.    CONTINUE OMEPRAZOLE. TAKE 30 MINUTES PRIOR TO YOUR MEALS TWICE DAILY.   AVOID ASPIRIN, IBUPROFEN, MOTRIN, ALEVE, BC/GOODY POWDERS FOR 2 WEEKS. USE TYLENOL FOR PAIN.  FOLLOW A HIGH FIBER/LOW FAT DIET. AVOID ITEMS THAT CAUSE BLOATING. SEE INFO BELOW.  YOUR BIOPSY RESULTS SHOULD BE BACK IN 7 DAYS.  FOLLOW UP IN SEP 2015.  Next colonoscopy in 10 years.  ENDOSCOPY Care After Read the instructions outlined below and refer to this sheet in the next week. These discharge instructions provide you with general information on caring for yourself after you leave the hospital. While your treatment has been planned according to the most current medical practices available, unavoidable complications occasionally occur. If you have any problems or questions after discharge, call DR. Paras Kreider, [phone].  ACTIVITY  You may resume your regular activity, but move at a slower pace for the next 24 hours.   Take frequent rest periods for the next 24 hours.   Walking will help get rid of the air and reduce the bloated feeling in your belly (abdomen).   No driving for 24 hours (because of the medicine (anesthesia) used during the test).   You may shower.   Do not sign any important legal documents or operate any machinery for 24 hours (because of the anesthesia used during the test).    NUTRITION  Drink plenty of fluids.   You may resume your normal diet as instructed by your doctor.   Begin with a light meal and progress to your normal diet. Heavy or fried foods are harder to digest and may make you feel sick to your stomach (nauseated).   Avoid alcoholic beverages for 24 hours or as  instructed.    MEDICATIONS  You may resume your normal medications.   WHAT YOU CAN EXPECT TODAY  Some feelings of bloating in the abdomen.   Passage of more gas than usual.   Spotting of blood in your stool or on the toilet paper  .  IF YOU HAD POLYPS REMOVED DURING THE ENDOSCOPY:  Eat a soft diet IF YOU HAVE NAUSEA, BLOATING, ABDOMINAL PAIN, OR VOMITING.    FINDING OUT THE RESULTS OF YOUR TEST Not all test results are available during your visit. DR. Oneida Alar WILL CALL YOU WITHIN 7 DAYS OF YOUR PROCEDUE WITH YOUR RESULTS. Do not assume everything is normal if you have not heard from DR. Keysean Savino IN ONE WEEK, CALL HER OFFICE AT [phone].  SEEK IMMEDIATE MEDICAL ATTENTION AND CALL THE OFFICE: [phone] IF:  You have more than a spotting of blood in your stool.   Your belly is swollen (abdominal distention).   You are nauseated or vomiting.   You have a temperature over 101F.   You have abdominal pain or discomfort that is severe or gets worse throughout the day.   Gastritis  Gastritis is an inflammation (the body's way of reacting to injury and/or infection) of the stomach. It is often caused by viral or bacterial (germ) infections. It can also be caused BY ASPIRIN, BC/GOODY POWDER'S, (IBUPROFEN) MOTRIN, OR ALEVE (NAPROXEN), chemicals (including alcohol), SPICY FOODS, and medications. This illness may be associated with generalized malaise (feeling tired, not well), UPPER ABDOMINAL STOMACH cramps, and  fever. One common bacterial cause of gastritis is an organism known as H. Pylori. This can be treated with antibiotics.   High-Fiber Diet A high-fiber diet changes your normal diet to include more whole grains, legumes, fruits, and vegetables. Changes in the diet involve replacing refined carbohydrates with unrefined foods. The calorie level of the diet is essentially unchanged. The Dietary Reference Intake (recommended amount) for adult males is 38 grams per day. For adult  females, it is 25 grams per day. Pregnant and lactating women should consume 28 grams of fiber per day. Fiber is the intact part of a plant that is not broken down during digestion. Functional fiber is fiber that has been isolated from the plant to provide a beneficial effect in the body. PURPOSE  Increase stool bulk.   Ease and regulate bowel movements.   Lower cholesterol.  INDICATIONS THAT YOU NEED MORE FIBER  Constipation and hemorrhoids.   Uncomplicated diverticulosis (intestine condition) and irritable bowel syndrome.   Weight management.   As a protective measure against hardening of the arteries (atherosclerosis), diabetes, and cancer.   GUIDELINES FOR INCREASING FIBER IN THE DIET  Start adding fiber to the diet slowly. A gradual increase of about 5 more grams (2 slices of whole-wheat bread, 2 servings of most fruits or vegetables, or 1 bowl of high-fiber cereal) per day is best. Too rapid an increase in fiber may result in constipation, flatulence, and bloating.   Drink enough water and fluids to keep your urine clear or pale yellow. Water, juice, or caffeine-free drinks are recommended. Not drinking enough fluid may cause constipation.   Eat a variety of high-fiber foods rather than one type of fiber.   Try to increase your intake of fiber through using high-fiber foods rather than fiber pills or supplements that contain small amounts of fiber.   The goal is to change the types of food eaten. Do not supplement your present diet with high-fiber foods, but replace foods in your present diet.  INCLUDE A VARIETY OF FIBER SOURCES  Replace refined and processed grains with whole grains, canned fruits with fresh fruits, and incorporate other fiber sources. Bies rice, Deas breads, and most bakery goods contain little or no fiber.   Brown whole-grain rice, buckwheat oats, and many fruits and vegetables are all good sources of fiber. These include: broccoli, Brussels sprouts,  cabbage, cauliflower, beets, sweet potatoes, [place] potatoes (skin on), carrots, tomatoes, eggplant, squash, berries, fresh fruits, and dried fruits.   Cereals appear to be the richest source of fiber. Cereal fiber is found in whole grains and bran. Bran is the fiber-rich outer coat of cereal grain, which is largely removed in refining. In whole-grain cereals, the bran remains. In breakfast cereals, the largest amount of fiber is found in those with "bran" in their names. The fiber content is sometimes indicated on the label.   You may need to include additional fruits and vegetables each day.   In baking, for 1 cup Ihnen flour, you may use the following substitutions:   1 cup whole-wheat flour minus 2 tablespoons.   1/2 cup [place] flour plus 1/2 cup whole-wheat flour.    Low-Fat Diet BREADS, CEREALS, PASTA, RICE, DRIED PEAS, AND BEANS These products are high in carbohydrates and most are low in fat. Therefore, they can be increased in the diet as substitutes for fatty foods. They too, however, contain calories and should not be eaten in excess. Cereals can be eaten for snacks as well as for breakfast.  Include foods that contain fiber (fruits, vegetables, whole grains, and legumes). Research shows that fiber may lower blood cholesterol levels, especially the water-soluble fiber found in fruits, vegetables, oat products, and legumes. FRUITS AND VEGETABLES It is good to eat fruits and vegetables. Besides being sources of fiber, both are rich in vitamins and some minerals. They help you get the daily allowances of these nutrients. Fruits and vegetables can be used for snacks and desserts. MEATS Limit lean meat, chicken, [place], and fish to no more than 6 ounces per day. Beef, Pork, and Lamb Use lean cuts of beef, pork, and lamb. Lean cuts include:  Extra-lean ground beef.  Arm roast.  Sirloin tip.  Center-cut ham.  Round steak.  Loin chops.  Rump roast.  Tenderloin.  Trim all fat off the  outside of meats before cooking. It is not necessary to severely decrease the intake of red meat, but lean choices should be made. Lean meat is rich in protein and contains a highly absorbable form of iron. Premenopausal women, in particular, should avoid reducing lean red meat because this could increase the risk for low red blood cells (iron-deficiency anemia).  Chicken and [place] These are good sources of protein. The fat of poultry can be reduced by removing the skin and underlying fat layers before cooking. Chicken and [place] can be substituted for lean red meat in the diet. Poultry should not be fried or covered with high-fat sauces. Fish and Shellfish Fish is a good source of protein. Shellfish contain cholesterol, but they usually are low in saturated fatty acids. The preparation of fish is important. Like chicken and [place], they should not be fried or covered with high-fat sauces. EGGS Egg whites contain no fat or cholesterol. They can be eaten often. Try 1 to 2 egg whites instead of whole eggs in recipes or use egg substitutes that do not contain yolk.  MILK AND DAIRY PRODUCTS Use skim or 1% milk instead of 2% or whole milk. Decrease whole milk, natural, and processed cheeses. Use nonfat or low-fat (2%) cottage cheese or low-fat cheeses made from vegetable oils. Choose nonfat or low-fat (1 to 2%) yogurt. Experiment with evaporated skim milk in recipes that call for heavy cream. Substitute low-fat yogurt or low-fat cottage cheese for sour cream in dips and salad dressings. Have at least 2 servings of low-fat dairy products, such as 2 glasses of skim (or 1%) milk each day to help get your daily calcium intake.  FATS AND OILS Butterfat, lard, and beef fats are high in saturated fat and cholesterol. These should be avoided.Vegetable fats do not contain cholesterol. AVOID coconut oil, palm oil, and palm kernel oil, WHICH are very high in saturated fats. These should be limited. These fats are  often used in bakery goods, processed foods, popcorn, oils, and nondairy creamers. Vegetable shortenings and some peanut butters contain hydrogenated oils, which are also saturated fats. Read the labels on these foods and check for saturated vegetable oils.  Desirable liquid vegetable oils are corn oil, cottonseed oil, olive oil, canola oil, safflower oil, soybean oil, and sunflower oil. Peanut oil is not as good, but small amounts are acceptable. Buy a heart-healthy tub margarine that has no partially hydrogenated oils in the ingredients. AVOID Mayonnaise and salad dressings often are made from unsaturated fats.  OTHER EATING TIPS Snacks  Most sweets should be limited as snacks. They tend to be rich in calories and fats, and their caloric content outweighs their nutritional value. Some good choices  in snacks are graham crackers, melba toast, soda crackers, bagels (no egg), English muffins, fruits, and vegetables. These snacks are preferable to snack crackers, [place] fries, and chips. Popcorn should be air-popped or cooked in small amounts of liquid vegetable oil.  Desserts Eat fruit, low-fat yogurt, and fruit ices instead of pastries, cake, and cookies. Sherbet, angel food cake, gelatin dessert, frozen low-fat yogurt, or other frozen products that do not contain saturated fat (pure fruit juice bars, frozen ice pops) are also acceptable.   COOKING METHODS Choose those methods that use little or no fat. They include: Poaching.  Braising.  Steaming.  Grilling.  Baking.  Stir-frying.  Broiling.  Microwaving.  Foods can be cooked in a nonstick pan without added fat, or use a nonfat cooking spray in regular cookware. Limit fried foods and avoid frying in saturated fat. Add moisture to lean meats by using water, broth, cooking wines, and other nonfat or low-fat sauces along with the cooking methods mentioned above. Soups and stews should be chilled after cooking. The fat that forms on top after a few  hours in the refrigerator should be skimmed off. When preparing meals, avoid using excess salt. Salt can contribute to raising blood pressure in some people.  EATING AWAY FROM HOME Order entres, potatoes, and vegetables without sauces or butter. When meat exceeds the size of a deck of cards (3 to 4 ounces), the rest can be taken home for another meal. Choose vegetable or fruit salads and ask for low-calorie salad dressings to be served on the side. Use dressings sparingly. Limit high-fat toppings, such as bacon, crumbled eggs, cheese, sunflower seeds, and olives. Ask for heart-healthy tub margarine instead of butter.   Polyps, Colon  A polyp is extra tissue that grows inside your body. Colon polyps grow in the large intestine. The large intestine, also called the colon, is part of your digestive system. It is a long, hollow tube at the end of your digestive tract where your body makes and stores stool. Most polyps are not dangerous. They are benign. This means they are not cancerous. But over time, some types of polyps can turn into cancer. Polyps that are smaller than a pea are usually not harmful. But larger polyps could someday become or may already be cancerous. To be safe, doctors remove all polyps and test them.   WHO GETS POLYPS? Anyone can get polyps, but certain people are more likely than others. You may have a greater chance of getting polyps if:  You are over 50.   You have had polyps before.   Someone in your family has had polyps.   Someone in your family has had cancer of the large intestine.   Find out if someone in your family has had polyps. You may also be more likely to get polyps if you:   Eat a lot of fatty foods   Smoke   Drink alcohol   Do not exercise  Eat too much   TREATMENT  The caregiver will remove the polyp during sigmoidoscopy or colonoscopy.  PREVENTION There is not one sure way to prevent polyps. You might be able to lower your risk of getting them  if you:  Eat more fruits and vegetables and less fatty food.   Do not smoke.   Avoid alcohol.   Exercise every day.   Lose weight if you are overweight.   Eating more calcium and folate can also lower your risk of getting polyps. Some foods that are  rich in calcium are milk, cheese, and broccoli. Some foods that are rich in folate are chickpeas, kidney beans, and spinach.   Diverticulosis Diverticulosis is a common condition that develops when small pouches (diverticula) form in the wall of the colon. The risk of diverticulosis increases with age. It happens more often in people who eat a low-fiber diet. Most individuals with diverticulosis have no symptoms. Those individuals with symptoms usually experience belly (abdominal) pain, constipation, or loose stools (diarrhea).  HOME CARE INSTRUCTIONS  Increase the amount of fiber in your diet as directed by your caregiver or dietician. This may reduce symptoms of diverticulosis.   Drink at least 6 to 8 glasses of water each day to prevent constipation.   Try not to strain when you have a bowel movement.   Avoiding nuts and seeds to prevent complications is still an uncertain benefit.   FOODS HAVING HIGH FIBER CONTENT INCLUDE:  Fruits. Apple, peach, pear, tangerine, raisins, prunes.   Vegetables. Brussels sprouts, asparagus, broccoli, cabbage, carrot, cauliflower, romaine lettuce, spinach, summer squash, tomato, winter squash, zucchini.   Starchy Vegetables. Baked beans, kidney beans, lima beans, split peas, lentils, potatoes (with skin).   Grains. Whole wheat bread, brown rice, bran flake cereal, plain oatmeal, Marcel rice, shredded wheat, bran muffins.   SEEK IMMEDIATE MEDICAL CARE IF:  You develop increasing pain or severe bloating.   You have an oral temperature above 101F.   You develop vomiting or bowel movements that are bloody or black.   Hemorrhoids Hemorrhoids are dilated (enlarged) veins around the rectum. Sometimes  clots will form in the veins. This makes them swollen and painful. These are called thrombosed hemorrhoids. Causes of hemorrhoids include:  Constipation.   Straining to have a bowel movement.   HEAVY LIFTING HOME CARE INSTRUCTIONS  Eat a well balanced diet and drink 6 to 8 glasses of water every day to avoid constipation. You may also use a bulk laxative.   Avoid straining to have bowel movements.   Keep anal area dry and clean.   Do not use a donut shaped pillow or sit on the toilet for long periods. This increases blood pooling and pain.   Move your bowels when your body has the urge; this will require less straining and will decrease pain and pressure.

## 2014-04-26 NOTE — Transfer of Care (Signed)
Immediate Anesthesia Transfer of Care Note  Patient: Megan Ingram  Procedure(s) Performed: Procedure(s) with comments: COLONOSCOPY WITH PROPOFOL (N/A) - in cecum at 0810 out at 0839 = 29 total minutes ESOPHAGOGASTRODUODENOSCOPY (EGD) WITH PROPOFOL (N/A) SAVORY DILATION (N/A) - 14/15/16 POLYPECTOMY (N/A) GASTRIC BIOPSY (N/A)  Patient Location: PACU  Anesthesia Type:MAC  Level of Consciousness: awake, alert  and oriented  Airway & Oxygen Therapy: Patient Spontanous Breathing and Patient connected to face mask oxygen  Post-op Assessment: Report given to PACU RN  Post vital signs: Reviewed and stable  Complications: No apparent anesthesia complications

## 2014-04-26 NOTE — Op Note (Signed)
[facility] [address] [place], [address]   ENDOSCOPY PROCEDURE REPORT  PATIENT: Megan, Ingram  MR#: [id number] BIRTHDATE: November 10, 1946 , 67  yrs. old GENDER: Female  ENDOSCOPIST: Barney Drain, MD REFFERED BD:[id number] Ahmed Prima, NP  PROCEDURE DATE:  04/26/2014 PROCEDURE:   EGD with biopsy,  with dilatation over guidewire, and EGD with snare polypectomy/CLIP PLACEMENT INDICATIONS:1.  dyspepsia.   2.  epigastric pain.   3.  nausea and vomiting. USES ASPIRIN. MEDICATIONS: MAC sedation, administered by CRNA TOPICAL ANESTHETIC: Viscous Xylocaine  DESCRIPTION OF PROCEDURE:   After the risks benefits and alternatives of the procedure were thoroughly explained, informed consent was obtained.  The     endoscope was introduced through the mouth and advanced to the second portion of the duodenum. The instrument was slowly withdrawn as the mucosa was carefully examined.  Prior to withdrawal of the scope, the guidwire was placed.  The esophagus was dilated successfully.  The patient was recovered in endoscopy and discharged home in satisfactory condition.   ESOPHAGUS: A stricture was found at the gastroesophageal junction. The stenosis was traversable with the endoscope.   A medium sized hiatal hernia was noted.   STOMACH: Moderate erosive gastritis was found in the gastric antrum.  Multiple biopsies were performed. Multiple polypoid shaped sessile polyps WITH ONE measuring 1.5 cm in size on the greater curvature of the gastric body.  THE LARGE POLYP WAS REMOVED VIA snare cautery.  The resection was complete and the polyp tissue was completely retrieved. ONE RESOLUTION CLIP PLACED TO PREVENT POST-POLYPECTOMY BLEED.  DUODENUM: Moderate duodenal inflammation was found in the duodenal bulb.   The duodenal mucosa showed no abnormalities in the 2nd part of the duodenum.   Dilation was then performed at the gastroesphageal junction Dilator: Savary over guidewire Size(s):  14-16 MM Resistance: minimal TO [id number](22WL)NLGX: yes-TRACECOMPLICATIONS: There were no complications.  ENDOSCOPIC IMPRESSION: 1.   Stricture at the gastroesophageal junction 2.   Medium sized hiatal hernia 3.   MODERATE Erosive gastritis/DUODENITIS 4.   Multiple GASTRIC polyps-ONE REMOVED  RECOMMENDATIONS: NO MRI FOR 30 DAYS.  CONTINUE OMEPRAZOLE.  TAKE 30 MINUTES PRIOR TO YOUR MEALS TWICE DAILY. AVOID ASPIRIN, IBUPROFEN, MOTRIN, ALEVE, BC/GOODY POWDERS FOR 2 WEEKS.  USE TYLENOL FOR PAIN. FOLLOW A HIGH FIBER/LOW FAT DIET.  AVOID ITEMS THAT CAUSE BLOATING.  BIOPSY RESULTS SHOULD BE BACK IN 7 DAYS. FOLLOW UP IN SEP 2015. Next colonoscopy in 10 years.  Consider overtube/peds scope.  PT SHOULD AVOID NUTS 3 DAYS PRIOR TO, FULL LIQUIDS 2 DAYS PRIOR, AND CLEAR LIQUIDS 1 DAY TO TCS.  _______________________________ Lorrin MaisBarney Drain, MD 04/26/2014 11:43 AM

## 2014-04-26 NOTE — Anesthesia Preprocedure Evaluation (Addendum)
Anesthesia Evaluation  Patient identified by MRN, date of birth, ID band Patient awake    Reviewed: Allergy & Precautions, H&P , NPO status , Patient's Chart, lab work & pertinent test results  History of Anesthesia Complications (+) PONV and history of anesthetic complications  Airway Mallampati: II TM Distance: >3 FB   Mouth opening: Limited Mouth Opening Comment: TMJ issues, limited opening Dental  (+) Teeth Intact   Pulmonary former smoker,  breath sounds clear to auscultation        Cardiovascular negative cardio ROS  Rhythm:Regular     Neuro/Psych  Headaches, PSYCHIATRIC DISORDERS Depression  Neuromuscular disease    GI/Hepatic GERD-  Medicated and Controlled,  Endo/Other    Renal/GU      Musculoskeletal   Abdominal   Peds  Hematology   Anesthesia Other Findings   Reproductive/Obstetrics                          Anesthesia Physical Anesthesia Plan  ASA: II  Anesthesia Plan: MAC   Post-op Pain Management:    Induction: Intravenous  Airway Management Planned: Simple Face Mask  Additional Equipment:   Intra-op Plan:   Post-operative Plan:   Informed Consent: I have reviewed the patients History and Physical, chart, labs and discussed the procedure including the risks, benefits and alternatives for the proposed anesthesia with the patient or authorized representative who has indicated his/her understanding and acceptance.     Plan Discussed with:   Anesthesia Plan Comments:         Anesthesia Quick Evaluation

## 2014-04-26 NOTE — Op Note (Addendum)
[facility] [address] [place], [address]   COLONOSCOPY PROCEDURE REPORT  PATIENT: Megan Ingram, Megan Ingram  MR#: [id number] BIRTHDATE: 08/27/1946 , 67  yrs. old GENDER: Female ENDOSCOPIST: Barney Drain, MD REFERRED OB:[id number] Strader, NP PROCEDURE DATE:  04/26/2014 PROCEDURE:   Colonoscopy with cold biopsy polypectomy INDICATIONS:Average risk patient for colon cancer. MEDICATIONS: MAC sedation, administered by CRNA  DESCRIPTION OF PROCEDURE:    Physical exam was performed.  Informed consent was obtained from the patient after explaining the benefits, risks, and alternatives to procedure.  The patient was connected to monitor and placed in left lateral position. Continuous oxygen was provided by nasal cannula and IV medicine administered through an indwelling cannula.  After administration of sedation and rectal exam, the patients rectum was intubated and the     colonoscope was advanced under direct visualization to the ileum.  The scope was removed slowly by carefully examining the color, texture, anatomy, and integrity mucosa on the way out.  The patient was recovered in endoscopy and discharged home in satisfactory condition.    COLON FINDINGS: The mucosa appeared normal in the terminal ileum.  , There was mild diverticulosis noted in the sigmoid colon with associated angulation. THREE 2-3 MM SESSLE RECTAL POLYPS REMOVED VIA COLD FORCEPS. Moderate sized internal hemorrhoids were found. The LEFT colon IS redundant.  Manual abdominal counter-pressure was used to reach the cecum.  PREP QUALITY: fair BUT GOOD AFTER ASPIRATION/IRRIGATION.CECAL W/D TIME: 30 minutes DUE TO NUT PARTICLES IN LUMEN OF COLON AND CLOGGING UP THE SUCTION CHANEL    COMPLICATIONS: None  ENDOSCOPIC IMPRESSION: 1.   Normal mucosa in the terminal ileum 2.   Mild diverticulosis in the sigmoid colon 3.   Moderate sized internal hemorrhoids 4.   THREE RECTAL POLYPS REMOVED. 5.   The LEFT  colon is redundant  RECOMMENDATIONS: CONTINUE OMEPRAZOLE.  TAKE 30 MINUTES PRIOR TO YOUR MEALS TWICE DAILY. AVOID ASPIRIN, IBUPROFEN, MOTRIN, ALEVE, BC/GOODY POWDERS FOR 2 WEEKS.  USE TYLENOL FOR PAIN. FOLLOW A HIGH FIBER/LOW FAT DIET.  AVOID ITEMS THAT CAUSE BLOATING.  BIOPSY RESULTS SHOULD BE BACK IN 7 DAYS. FOLLOW UP IN SEP 2015.  Next colonoscopy in 10 years.  Consider overtube/pedsc scope. PT SHOULD AVOID NUTS 3 DAYS PRIOR TO, FULL LIQUIDS 2 DAYS PRIOR, AND CLEAR LIQUIDS 1 DAY TO TCS.      _______________________________ Lorrin MaisBarney Drain, MD 04/28/2014 9:46 AM Revised: 04/28/2014 9:46 AM

## 2014-04-26 NOTE — Addendum Note (Signed)
Addendum created 04/26/14 1020 by Ollen Bowl, CRNA   Modules edited: Notes Section   Notes Section:  File: [id number]

## 2014-04-26 NOTE — Telephone Encounter (Signed)
REVIEWED.  

## 2014-04-28 ENCOUNTER — Encounter (HOSPITAL_COMMUNITY): Payer: Self-pay | Admitting: Gastroenterology

## 2014-05-06 ENCOUNTER — Telehealth: Payer: Self-pay | Admitting: Gastroenterology

## 2014-05-06 NOTE — Telephone Encounter (Signed)
Please call pt. She had HYPERPLASTIC POLYPS removed. THE POLYP REMOVED FROM HER STOMACH WAS BENIGN.    CONTINUE OMEPRAZOLE. TAKE 30 MINUTES PRIOR TO YOUR MEALS TWICE DAILY FOR 3 MOS THE ONCE DAILY.   AVOID ASPIRIN, IBUPROFEN, MOTRIN, ALEVE, BC/GOODY POWDERS UNTIL MAY 18.Marland Kitchen USE TYLENOL FOR PAIN.  FOLLOW A HIGH FIBER/LOW FAT DIET. AVOID ITEMS THAT CAUSE BLOATING.   FOLLOW UP IN SEP 2015 E15 NAUSEA.  Next colonoscopy in 10 years.

## 2014-05-06 NOTE — Telephone Encounter (Signed)
LMOM to call back

## 2014-05-09 NOTE — Telephone Encounter (Signed)
Reminder in Epic 

## 2014-05-09 NOTE — Telephone Encounter (Signed)
Reminders in epic °

## 2014-05-09 NOTE — Telephone Encounter (Signed)
Pt is aware of results. 

## 2014-05-09 NOTE — Telephone Encounter (Signed)
LMOM for a return call.  

## 2014-07-21 ENCOUNTER — Encounter: Payer: Self-pay | Admitting: Gastroenterology

## 2014-09-14 ENCOUNTER — Ambulatory Visit (INDEPENDENT_AMBULATORY_CARE_PROVIDER_SITE_OTHER): Payer: Medicare Other | Admitting: Gastroenterology

## 2014-09-14 ENCOUNTER — Encounter: Payer: Self-pay | Admitting: Gastroenterology

## 2014-09-14 VITALS — BP 118/77 | HR 76 | Temp 98.0°F | Ht 63.0 in | Wt 154.6 lb

## 2014-09-14 DIAGNOSIS — R112 Nausea with vomiting, unspecified: Secondary | ICD-10-CM

## 2014-09-14 NOTE — Progress Notes (Signed)
PATIENT NIC'D  °

## 2014-09-14 NOTE — Progress Notes (Signed)
Subjective:    Patient ID: Megan Ingram, female    DOB: [birth year], 68 y.o.   MRN: [id number]  [name], NP  HPI NO NAUSEA. QUESTIONS ABOUT WHAT IS A REDUNDANT COLON? DIDN'T LIKE THE LINZESS: EXPLOSIVE DIARRHEA. No problems swallowing. MAY HAVE FORMED TO WATERY STOOLS.  Past Medical History  Diagnosis Date  . Chest pain   . Gastroesophageal reflux disease   . Genital herpes   . Endometriosis   . Degenerative joint disease   . Osteopenia   . Depression   . Migraines     Ceased at menopause  . Complication of anesthesia   . PONV (postoperative nausea and vomiting)    Past Surgical History  Procedure Laterality Date  . Cesarean section  1968  . Colonoscopy  2000    incomplete due to pain  . Salpingoophorectomy  1971  . Abdominal hysterectomy      Single ovary remained in place.  . Cataract extraction  2012    bilateral; with lens implant  . Rotator cuff repair  2013    Left  . Bunionectomy  2007    Bilateral  . Abdominal surgery    . Exploratory laparotomy      ovarian ectopic pregnancy  . Colonoscopy with propofol N/A 04/26/2014    Procedure: COLONOSCOPY WITH PROPOFOL;  Surgeon: Danie Binder, MD;  Location: [facility];  Service: Endoscopy;  Laterality: N/A;  in cecum at 0810 out at 0839 = 29 total minutes  . Esophagogastroduodenoscopy (egd) with propofol N/A 04/26/2014    Procedure: ESOPHAGOGASTRODUODENOSCOPY (EGD) WITH PROPOFOL;  Surgeon: Danie Binder, MD;  Location: [facility];  Service: Endoscopy;  Laterality: N/A;  . Savory dilation N/A 04/26/2014    Procedure: SAVORY DILATION;  Surgeon: Danie Binder, MD;  Location: [facility];  Service: Endoscopy;  Laterality: N/A;  14/15/16  . Polypectomy N/A 04/26/2014    Procedure: POLYPECTOMY;  Surgeon: Danie Binder, MD;  Location: [facility];  Service: Endoscopy;  Laterality: N/A;  . Esophageal biopsy N/A 04/26/2014    Procedure: GASTRIC BIOPSY;  Surgeon: Danie Binder, MD;  Location: [facility];  Service: Endoscopy;  Laterality:  N/A;   Allergies  Allergen Reactions  . Bee Venom Anaphylaxis  . Fentanyl And Related Hives  . Hymenoptera Venom Preparations   . Versed [Midazolam] Hives   Current Outpatient Prescriptions  Medication Sig Dispense Refill  . acyclovir (ZOVIRAX) 400 MG tablet Take 400 mg by mouth daily.      . citalopram (CELEXA) 40 MG tablet Take 1 tablet (40 mg total) by mouth daily.    Marland Kitchen omeprazole (PRILOSEC) 20 MG capsule 1 PO 30 MINS PRIOR TO MEALS BID FOR 3 MOS THEN 30 MINS BEFORE YOUR FIRST MEAL    . simvastatin (ZOCOR) 20 MG tablet Take 20 mg by mouth daily.        Review of Systems     Objective:   Physical Exam  Vitals reviewed. Constitutional: She is oriented to person, place, and time. She appears well-developed and well-nourished. No distress.  HENT:  Head: Normocephalic and atraumatic.  Mouth/Throat: Oropharynx is clear and moist. No oropharyngeal exudate.  Eyes: Pupils are equal, round, and reactive to light. No scleral icterus.  Neck: Normal range of motion. Neck supple.  Cardiovascular: Normal rate, regular rhythm and normal heart sounds.   Pulmonary/Chest: Effort normal and breath sounds normal. No respiratory distress.  Abdominal: Soft. Bowel sounds are normal. She exhibits no distension. There is no tenderness.  Musculoskeletal: She exhibits no edema.  Lymphadenopathy:    She has no cervical adenopathy.  Neurological: She is alert and oriented to person, place, and time.  Psychiatric: She has a normal mood and affect.          Assessment & Plan:

## 2014-09-14 NOTE — Patient Instructions (Signed)
PLEASE CALL WITH QUESTIONS OR CONCERNS.  FOLLOW UP IN 1 YEAR.     

## 2014-09-14 NOTE — Assessment & Plan Note (Signed)
RESOLVED.  CONTINUE TO MONITOR SYMPTOMS. OPV IN 1 YEAR

## 2014-09-22 NOTE — Progress Notes (Signed)
cc'ed to pcp °

## 2014-11-14 ENCOUNTER — Other Ambulatory Visit (HOSPITAL_COMMUNITY): Payer: Self-pay | Admitting: Nurse Practitioner

## 2014-11-14 DIAGNOSIS — Z1231 Encounter for screening mammogram for malignant neoplasm of breast: Secondary | ICD-10-CM

## 2014-12-05 ENCOUNTER — Ambulatory Visit (HOSPITAL_COMMUNITY)
Admission: RE | Admit: 2014-12-05 | Discharge: 2014-12-05 | Disposition: A | Discharge status: Home / Self Care | Payer: Medicare Other | Source: Ambulatory Visit | Attending: Nurse Practitioner | Admitting: Nurse Practitioner

## 2014-12-05 DIAGNOSIS — Z1231 Encounter for screening mammogram for malignant neoplasm of breast: Secondary | ICD-10-CM | POA: Diagnosis not present

## 2014-12-20 ENCOUNTER — Telehealth: Payer: Self-pay | Admitting: Gastroenterology

## 2014-12-20 MED ORDER — OMEPRAZOLE 20 MG PO CPDR: 20.0000 mg | DELAYED_RELEASE_CAPSULE | Freq: Every day | ORAL | Status: DC

## 2014-12-20 NOTE — Telephone Encounter (Signed)
The patient medication order is for Omeprazole (generic of Prilosec) with no sign of name brand and "DAW" being ordered. Please follow-up with the patient and/or pharmacist for clarification.

## 2014-12-20 NOTE — Addendum Note (Signed)
Addended by: Gordy Levan, Assia Meanor A on: 12/20/2014 01:51 PM   Modules accepted: Orders

## 2014-12-20 NOTE — Telephone Encounter (Signed)
Routing to refill box  

## 2014-12-20 NOTE — Telephone Encounter (Signed)
I spoke to Megan Ingram at the pharmacy. He said they did not get a prescription for the medication and it has not been filled there. I called the pt and she said she has been buying OTC.  Please send in the prescription to [facility]. Thanks!

## 2014-12-20 NOTE — Telephone Encounter (Signed)
Pt said that SF has changed her prescription (Prilosec) from once a day to twice a day. Patient is having to pay out of pocket and it's getting too expensive for her to get refills. She said that her pharmacist told her that Omperazole was on formulary and wouldn't cost her nearly as much. Patient is asking if SF could send in a prescription of Omeprazole to [facility]. Please advise [phone]

## 2014-12-20 NOTE — Telephone Encounter (Signed)
Pt is aware.  

## 2014-12-20 NOTE — Telephone Encounter (Signed)
When Dr. Oneida Alar originally ordered it back in May she specified bid dosing for 3 months then to once a day. It's been over 3 months so I sent a script to the pharmacy for once daily. Please let the patient know.

## 2015-01-12 ENCOUNTER — Telehealth: Payer: Self-pay | Admitting: Gastroenterology

## 2015-01-12 NOTE — Telephone Encounter (Signed)
I called pt and she said she has had a dull feeling midline, right above her diaphragm for about 5 days. It has been mostly continuous.  However, she was told not to take much ASA and she has had a terrible headache for 5 days and taken ASA ( enteric coated) and Excedrin for the headache. She is not eating a lot and still has the dull pain. She feels it is coming from her stomach. I did tell her that if it gets worse ( especially since she describes it as substernal) to go to the ED.  She is aware that I will ask Dr. Oneida Alar for recommendations.

## 2015-01-12 NOTE — Telephone Encounter (Signed)
Patient called to speak with SF. I told her that I would take a message to give to her nurse. Patient has been having substernal pain above her diaphragm for about 5 days now and wanted to see what SF recommendations where. 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

## 2015-01-13 NOTE — Telephone Encounter (Signed)
PLEASE CALL PT. She PROBABLY HAS GASTRITIS DUE TO ASA USE. SHE SHOULD USE PRILOSEC BID FOR 1 MONTH AND THEN ONCE DAILY. SHE SHOULD FOLLOW A LOW FAT DIET AND AVOID SPICY FOODS.

## 2015-01-16 NOTE — Telephone Encounter (Signed)
Pt is aware.  

## 2015-03-29 ENCOUNTER — Telehealth: Payer: Self-pay | Admitting: Gastroenterology

## 2015-03-29 NOTE — Telephone Encounter (Signed)
PATIENT CALLED AND STATED THAT SHE IS HAVING SEVERE SIDE ABDOMINAL PAIN, PLEASE CALL HER AT [phone]

## 2015-03-29 NOTE — Telephone Encounter (Signed)
Tried to call pt- LMOM 

## 2015-03-30 NOTE — Telephone Encounter (Signed)
Called. Many rings and no answer.  

## 2015-03-30 NOTE — Telephone Encounter (Signed)
LMOM for a return call.  

## 2015-03-30 NOTE — Telephone Encounter (Signed)
Pt returned call. She described her problem of abdominal pain and diarrhea that began on 03/23/2015 while she was in [place]. She had some very stabbing pain in her mid abdomen and then follow by several hours of severe diarrhea. Then she got better.  On Sat 03/25/2015, and Sun and Mon she had bouts of diarrhea, many episodes with nothing but watery stools.   Now she has not had a BM for 3 days, and just feels tenderness.   Please advise!

## 2015-03-30 NOTE — Telephone Encounter (Signed)
Pt is aware.  

## 2015-03-30 NOTE — Telephone Encounter (Signed)
PLEASE CALL PT. IT SOUNDS LIKE SHE HAD A VIRAL ILLNESS, POSSIBLE FOOD POISONING, AND LESS LIKELY GASTROENTERITIS FROM A BACTERIA. HER GI UPSET SHOULD RESOLVE OVER THE NEXT 7-10 DAYS. SHE SHOULD AVOID DAIRY FOR 2 WEEKS. TAKE A PROBIOTIC DAILY FOR 3 MOS. USE KAOPECTATE PRN DIARRHEA AND TYLENOL OR IBUPROFEN 2 PO TID FOR 5 DAYS TO HELP WITH THE ABDOMINAL TENDERNESS. CALL IN 2 WEEKS IF HER PAIN IS NOT BETTER.

## 2015-04-03 ENCOUNTER — Other Ambulatory Visit (HOSPITAL_COMMUNITY): Payer: Self-pay | Admitting: Nurse Practitioner

## 2015-04-03 ENCOUNTER — Ambulatory Visit (HOSPITAL_COMMUNITY)
Admission: RE | Admit: 2015-04-03 | Discharge: 2015-04-03 | Disposition: A | Discharge status: Home / Self Care | Payer: Medicare Other | Source: Ambulatory Visit | Attending: Nurse Practitioner | Admitting: Nurse Practitioner

## 2015-04-03 DIAGNOSIS — R109 Unspecified abdominal pain: Secondary | ICD-10-CM

## 2015-04-03 DIAGNOSIS — R1010 Upper abdominal pain, unspecified: Secondary | ICD-10-CM | POA: Insufficient documentation

## 2015-04-04 NOTE — Telephone Encounter (Signed)
Received an abdominal X-ray report that pt had done on 04/03/2015 at 4:36 PM.  Impression: Fairly large amount of fecal matter in the colon which could be associated with abdominal pain.  Densities overlying the upper abdomen presumably associated with clothing.   Report attached to info for OV with Dr. Oneida Alar on 05/03/2015.

## 2015-04-04 NOTE — Telephone Encounter (Signed)
REVIEWED-NO ADDITIONAL RECOMMENDATIONS. 

## 2015-04-11 ENCOUNTER — Emergency Department (HOSPITAL_COMMUNITY)
Admission: EM | Admit: 2015-04-11 | Discharge: 2015-04-11 | Disposition: A | Discharge status: Home / Self Care | Payer: Medicare Other | Attending: Emergency Medicine | Admitting: Emergency Medicine

## 2015-04-11 ENCOUNTER — Encounter (HOSPITAL_COMMUNITY): Payer: Self-pay | Admitting: Emergency Medicine

## 2015-04-11 DIAGNOSIS — Z8739 Personal history of other diseases of the musculoskeletal system and connective tissue: Secondary | ICD-10-CM | POA: Diagnosis not present

## 2015-04-11 DIAGNOSIS — X58XXXA Exposure to other specified factors, initial encounter: Secondary | ICD-10-CM | POA: Insufficient documentation

## 2015-04-11 DIAGNOSIS — Z87891 Personal history of nicotine dependence: Secondary | ICD-10-CM | POA: Insufficient documentation

## 2015-04-11 DIAGNOSIS — F329 Major depressive disorder, single episode, unspecified: Secondary | ICD-10-CM | POA: Diagnosis not present

## 2015-04-11 DIAGNOSIS — T63441A Toxic effect of venom of bees, accidental (unintentional), initial encounter: Secondary | ICD-10-CM | POA: Insufficient documentation

## 2015-04-11 DIAGNOSIS — Z8679 Personal history of other diseases of the circulatory system: Secondary | ICD-10-CM | POA: Insufficient documentation

## 2015-04-11 DIAGNOSIS — S60562A Insect bite (nonvenomous) of left hand, initial encounter: Secondary | ICD-10-CM | POA: Diagnosis present

## 2015-04-11 DIAGNOSIS — K219 Gastro-esophageal reflux disease without esophagitis: Secondary | ICD-10-CM | POA: Insufficient documentation

## 2015-04-11 DIAGNOSIS — Z8619 Personal history of other infectious and parasitic diseases: Secondary | ICD-10-CM | POA: Insufficient documentation

## 2015-04-11 DIAGNOSIS — Y998 Other external cause status: Secondary | ICD-10-CM | POA: Insufficient documentation

## 2015-04-11 DIAGNOSIS — Z8742 Personal history of other diseases of the female genital tract: Secondary | ICD-10-CM | POA: Diagnosis not present

## 2015-04-11 DIAGNOSIS — Y9289 Other specified places as the place of occurrence of the external cause: Secondary | ICD-10-CM | POA: Diagnosis not present

## 2015-04-11 DIAGNOSIS — Y9389 Activity, other specified: Secondary | ICD-10-CM | POA: Diagnosis not present

## 2015-04-11 DIAGNOSIS — Z79899 Other long term (current) drug therapy: Secondary | ICD-10-CM | POA: Diagnosis not present

## 2015-04-11 DIAGNOSIS — L089 Local infection of the skin and subcutaneous tissue, unspecified: Secondary | ICD-10-CM

## 2015-04-11 MED ORDER — LORATADINE 10 MG PO TABS
10.0000 mg | ORAL_TABLET | Freq: Once | ORAL | Status: AC
  Administered 2015-04-11: 10 mg via ORAL
  Filled 2015-04-11: qty 1

## 2015-04-11 MED ORDER — PREDNISONE 10 MG PO TABS: 20.0000 mg | ORAL_TABLET | Freq: Two times a day (BID) | ORAL | Status: DC

## 2015-04-11 MED ORDER — FAMOTIDINE 20 MG PO TABS: 20.0000 mg | ORAL_TABLET | Freq: Two times a day (BID) | ORAL | Status: DC

## 2015-04-11 MED ORDER — FAMOTIDINE 20 MG PO TABS
20.0000 mg | ORAL_TABLET | Freq: Once | ORAL | Status: AC
  Administered 2015-04-11: 20 mg via ORAL
  Filled 2015-04-11: qty 1

## 2015-04-11 MED ORDER — PREDNISONE 20 MG PO TABS
40.0000 mg | ORAL_TABLET | Freq: Once | ORAL | Status: AC
  Administered 2015-04-11: 40 mg via ORAL
  Filled 2015-04-11: qty 2

## 2015-04-11 MED ORDER — LORATADINE 10 MG PO TABS: 10.0000 mg | ORAL_TABLET | Freq: Every day | ORAL | Status: DC

## 2015-04-11 MED ORDER — SULFAMETHOXAZOLE-TRIMETHOPRIM 800-160 MG PO TABS: 1.0000 | ORAL_TABLET | Freq: Two times a day (BID) | ORAL | Status: AC

## 2015-04-11 NOTE — ED Provider Notes (Signed)
CSN: [id number]     Arrival date & time 04/11/15  4196 History   First MD Initiated Contact with Patient 04/11/15 [phone]     Chief Complaint  Patient presents with  . Hand Pain     (Consider location/radiation/quality/duration/timing/severity/associated sxs/prior Treatment) Patient is a 69 y.o. female presenting with rash. The history is provided by the patient.  Rash Location:  Hand Hand rash location:  Dorsum of L hand Quality: burning, itchiness, painful, redness and swelling   Pain details:    Quality:  Itching, burning and stinging   Severity:  Moderate   Onset quality:  Gradual   Duration:  3 days   Timing:  Constant   Progression:  Worsening Severity:  Moderate Progression:  Worsening Chronicity:  New Context: insect bite/sting   Relieved by:  Nothing Worsened by:  Nothing tried Ineffective treatments:  Topical steroids  Megan Ingram is a 69 y.o. female who presents to the ED with left hand swelling and redness s/p wasp sting 3 days ago. Initially there was just itching and burning but last night she noted the area was more swollen, red and increased warmth. She feels that the area is getting infected.   Past Medical History  Diagnosis Date  . Chest pain   . Gastroesophageal reflux disease   . Genital herpes   . Endometriosis   . Degenerative joint disease   . Osteopenia   . Depression   . Migraines     Ceased at menopause  . Complication of anesthesia   . PONV (postoperative nausea and vomiting)    Past Surgical History  Procedure Laterality Date  . Cesarean section  1968  . Colonoscopy  2000    incomplete due to pain  . Salpingoophorectomy  1971  . Abdominal hysterectomy      Single ovary remained in place.  . Cataract extraction  2012    bilateral; with lens implant  . Rotator cuff repair  2013    Left  . Bunionectomy  2007    Bilateral  . Abdominal surgery    . Exploratory laparotomy      ovarian ectopic pregnancy  . Colonoscopy with  propofol N/A 04/26/2014    Procedure: COLONOSCOPY WITH PROPOFOL;  Surgeon: Danie Binder, MD;  Location: [facility];  Service: Endoscopy;  Laterality: N/A;  in cecum at 0810 out at 0839 = 29 total minutes  . Esophagogastroduodenoscopy (egd) with propofol N/A 04/26/2014    Procedure: ESOPHAGOGASTRODUODENOSCOPY (EGD) WITH PROPOFOL;  Surgeon: Danie Binder, MD;  Location: [facility];  Service: Endoscopy;  Laterality: N/A;  . Savory dilation N/A 04/26/2014    Procedure: SAVORY DILATION;  Surgeon: Danie Binder, MD;  Location: [facility];  Service: Endoscopy;  Laterality: N/A;  14/15/16  . Polypectomy N/A 04/26/2014    Procedure: POLYPECTOMY;  Surgeon: Danie Binder, MD;  Location: [facility];  Service: Endoscopy;  Laterality: N/A;  . Esophageal biopsy N/A 04/26/2014    Procedure: GASTRIC BIOPSY;  Surgeon: Danie Binder, MD;  Location: [facility];  Service: Endoscopy;  Laterality: N/A;   Family History  Problem Relation Age of Onset  . COPD Mother   . Breast cancer Mother   . Valvular heart disease Mother     aortic valve replacement  . Macular degeneration Mother   . Parkinson's disease Father   . Stroke Paternal Grandfather   . Colon cancer Sister    History  Substance Use Topics  . Smoking status: Former Smoker -- 0.50  packs/day for 2 years    Types: Cigarettes    Start date: 01/27/1977    Quit date: 01/27/1979  . Smokeless tobacco: Never Used     Comment: Quit in 1980  . Alcohol Use: No   OB History    No data available     Review of Systems  Musculoskeletal:       Left hand hand with redness, swelling and burning.   Skin: Positive for rash.  all other systems negative    Allergies  Bee venom; Fentanyl and related; Hymenoptera venom preparations; and Versed  Home Medications   Prior to Admission medications   Medication Sig Start Date End Date Taking? Authorizing Provider  acyclovir (ZOVIRAX) 400 MG tablet Take 400 mg by mouth daily.   Yes Historical Provider, MD  citalopram (CELEXA) 40 MG  tablet Take 1 tablet (40 mg total) by mouth daily. 01/28/13  Yes Yehuda Savannah, MD  omeprazole (PRILOSEC) 20 MG capsule Take 1 capsule (20 mg total) by mouth daily. 30 mins before first meal of the day 12/20/14  Yes Carlis Stable, NP  simvastatin (ZOCOR) 20 MG tablet Take 20 mg by mouth daily.   Yes Historical Provider, MD  famotidine (PEPCID) 20 MG tablet Take 1 tablet (20 mg total) by mouth 2 (two) times daily. 04/11/15   Aubri Gathright Bunnie Pion, NP  loratadine (CLARITIN) 10 MG tablet Take 1 tablet (10 mg total) by mouth daily. 04/11/15   [place], NP  predniSONE (DELTASONE) 10 MG tablet Take 2 tablets (20 mg total) by mouth 2 (two) times daily with a meal. 04/11/15   Katlyne Nishida Bunnie Pion, NP  sulfamethoxazole-trimethoprim (BACTRIM DS,SEPTRA DS) 800-160 MG per tablet Take 1 tablet by mouth 2 (two) times daily. 04/11/15 04/18/15  Ciji Boston Bunnie Pion, NP   BP 148/68 mmHg  Pulse 88  Temp(Src) 98.6 F (37 C) (Oral)  Resp 18  Ht 5\' 3"  (1.6 m)  Wt 148 lb (67.132 kg)  BMI 26.22 kg/m2  SpO2 100% Physical Exam  Constitutional: She is oriented to person, place, and time. She appears well-developed and well-nourished.  HENT:  Head: Normocephalic.  Eyes: EOM are normal.  Neck: Neck supple.  Cardiovascular: Normal rate.   Pulmonary/Chest: Effort normal.  Abdominal: Soft.  Musculoskeletal: Normal range of motion.       Left hand: She exhibits tenderness and swelling. She exhibits normal range of motion and normal capillary refill. Normal sensation noted. Normal strength noted.       Hands: Swelling, erythema and tenderness to the dorsum of the left hand. No red streaking noted. Radial pulse 2+, adequate circulation, good touch sensation.   Neurological: She is alert and oriented to person, place, and time. No cranial nerve deficit.  Skin: Skin is warm and dry.  Psychiatric: She has a normal mood and affect. Her behavior is normal.  Nursing note and vitals reviewed.   ED Course  Procedures (including critical care  time) Claritin 10 mg. Pepcid 20 mg. Prednisone 40 mg. Ice to the area.    MDM  69 y.o. female with bee sting to the dorsum of the left hand 3 days ago with local reaction and possible early infection. Will treat for local reaction and infection. Stable for d/c without red streaking, patient without fever and without neurovascular compromise.   Final diagnoses:  Skin infection  Bee sting reaction, accidental or unintentional, initial encounter       [facility], NP 04/11/15 [place], MD  04/11/15 1547 

## 2015-04-11 NOTE — ED Notes (Signed)
Pt reports was stung by a wasp on Sunday. Pt reports left hand "began to itch and swell" last night. Moderate swelling noted to left middle finger and top of left hand. Mild redness noted. Radial pulse intact.

## 2015-04-11 NOTE — ED Notes (Signed)
NP at bedside.

## 2015-05-03 ENCOUNTER — Ambulatory Visit: Payer: Medicare Other | Admitting: Gastroenterology

## 2015-07-25 ENCOUNTER — Encounter: Payer: Self-pay | Admitting: Gastroenterology

## 2015-09-05 ENCOUNTER — Emergency Department (HOSPITAL_COMMUNITY)
Admission: EM | Admit: 2015-09-05 | Discharge: 2015-09-05 | Disposition: A | Discharge status: Home / Self Care | Payer: Medicare Other | Attending: Emergency Medicine | Admitting: Emergency Medicine

## 2015-09-05 ENCOUNTER — Encounter (HOSPITAL_COMMUNITY): Payer: Self-pay | Admitting: Emergency Medicine

## 2015-09-05 DIAGNOSIS — Y9389 Activity, other specified: Secondary | ICD-10-CM | POA: Diagnosis not present

## 2015-09-05 DIAGNOSIS — F329 Major depressive disorder, single episode, unspecified: Secondary | ICD-10-CM | POA: Insufficient documentation

## 2015-09-05 DIAGNOSIS — Z87891 Personal history of nicotine dependence: Secondary | ICD-10-CM | POA: Insufficient documentation

## 2015-09-05 DIAGNOSIS — K219 Gastro-esophageal reflux disease without esophagitis: Secondary | ICD-10-CM | POA: Diagnosis not present

## 2015-09-05 DIAGNOSIS — Y92096 Garden or yard of other non-institutional residence as the place of occurrence of the external cause: Secondary | ICD-10-CM | POA: Diagnosis not present

## 2015-09-05 DIAGNOSIS — Y998 Other external cause status: Secondary | ICD-10-CM | POA: Insufficient documentation

## 2015-09-05 DIAGNOSIS — Z8742 Personal history of other diseases of the female genital tract: Secondary | ICD-10-CM | POA: Diagnosis not present

## 2015-09-05 DIAGNOSIS — Z79899 Other long term (current) drug therapy: Secondary | ICD-10-CM | POA: Insufficient documentation

## 2015-09-05 DIAGNOSIS — Z7952 Long term (current) use of systemic steroids: Secondary | ICD-10-CM | POA: Insufficient documentation

## 2015-09-05 DIAGNOSIS — Y288XXA Contact with other sharp object, undetermined intent, initial encounter: Secondary | ICD-10-CM | POA: Insufficient documentation

## 2015-09-05 DIAGNOSIS — S81812A Laceration without foreign body, left lower leg, initial encounter: Secondary | ICD-10-CM | POA: Insufficient documentation

## 2015-09-05 DIAGNOSIS — IMO0002 Reserved for concepts with insufficient information to code with codable children: Secondary | ICD-10-CM

## 2015-09-05 DIAGNOSIS — Z8619 Personal history of other infectious and parasitic diseases: Secondary | ICD-10-CM | POA: Diagnosis not present

## 2015-09-05 DIAGNOSIS — Z8739 Personal history of other diseases of the musculoskeletal system and connective tissue: Secondary | ICD-10-CM | POA: Diagnosis not present

## 2015-09-05 DIAGNOSIS — Z8679 Personal history of other diseases of the circulatory system: Secondary | ICD-10-CM | POA: Insufficient documentation

## 2015-09-05 DIAGNOSIS — Z23 Encounter for immunization: Secondary | ICD-10-CM | POA: Insufficient documentation

## 2015-09-05 MED ORDER — TETANUS-DIPHTH-ACELL PERTUSSIS 5-2.5-18.5 LF-MCG/0.5 IM SUSP
0.5000 mL | Freq: Once | INTRAMUSCULAR | Status: AC
  Administered 2015-09-05: 0.5 mL via INTRAMUSCULAR
  Filled 2015-09-05: qty 0.5

## 2015-09-05 MED ORDER — LIDOCAINE-EPINEPHRINE (PF) 1 %-1:200000 IJ SOLN
10.0000 mL | Freq: Once | INTRAMUSCULAR | Status: AC
  Administered 2015-09-05: 10 mL via INTRADERMAL
  Filled 2015-09-05: qty 10

## 2015-09-05 NOTE — ED Notes (Signed)
Laceration to lower left leg, cut on piece of metal out in her garden

## 2015-09-05 NOTE — ED Provider Notes (Signed)
CSN: [id number]     Arrival date & time 09/05/15  1919 History   First MD Initiated Contact with Patient 09/05/15 1937     Chief Complaint  Patient presents with  . Extremity Laceration     (Consider location/radiation/quality/duration/timing/severity/associated sxs/prior Treatment) HPI   Megan Ingram is a 69 y.o. female who presents to the Emergency Department complaining of laceration to her left lower leg that occurred while mowing her lawn shortly before ER arrival.  She states that she ran into a piece of metal, causing the laceration.  She has cleaned the wound, but has been unable to control the bleeding with pressure.  She denies pain, but admits to "soreness" of her leg with movement of her foot.  She denies numbness or weakness, swelling or foreign bodies.  Last Td is unknown.     Past Medical History  Diagnosis Date  . Chest pain   . Gastroesophageal reflux disease   . Genital herpes   . Endometriosis   . Degenerative joint disease   . Osteopenia   . Depression   . Migraines     Ceased at menopause  . Complication of anesthesia   . PONV (postoperative nausea and vomiting)    Past Surgical History  Procedure Laterality Date  . Cesarean section  1968  . Colonoscopy  2000    incomplete due to pain  . Salpingoophorectomy  1971  . Abdominal hysterectomy      Single ovary remained in place.  . Cataract extraction  2012    bilateral; with lens implant  . Rotator cuff repair  2013    Left  . Bunionectomy  2007    Bilateral  . Abdominal surgery    . Exploratory laparotomy      ovarian ectopic pregnancy  . Colonoscopy with propofol N/A 04/26/2014    Procedure: COLONOSCOPY WITH PROPOFOL;  Surgeon: Danie Binder, MD;  Location: [facility];  Service: Endoscopy;  Laterality: N/A;  in cecum at 0810 out at 0839 = 29 total minutes  . Esophagogastroduodenoscopy (egd) with propofol N/A 04/26/2014    Procedure: ESOPHAGOGASTRODUODENOSCOPY (EGD) WITH PROPOFOL;  Surgeon: Danie Binder, MD;  Location: [facility];  Service: Endoscopy;  Laterality: N/A;  . Savory dilation N/A 04/26/2014    Procedure: SAVORY DILATION;  Surgeon: Danie Binder, MD;  Location: [facility];  Service: Endoscopy;  Laterality: N/A;  14/15/16  . Polypectomy N/A 04/26/2014    Procedure: POLYPECTOMY;  Surgeon: Danie Binder, MD;  Location: [facility];  Service: Endoscopy;  Laterality: N/A;  . Esophageal biopsy N/A 04/26/2014    Procedure: GASTRIC BIOPSY;  Surgeon: Danie Binder, MD;  Location: [facility];  Service: Endoscopy;  Laterality: N/A;   Family History  Problem Relation Age of Onset  . COPD Mother   . Breast cancer Mother   . Valvular heart disease Mother     aortic valve replacement  . Macular degeneration Mother   . Parkinson's disease Father   . Stroke Paternal Grandfather   . Colon cancer Sister    Social History  Substance Use Topics  . Smoking status: Former Smoker -- 0.50 packs/day for 2 years    Types: Cigarettes    Start date: 01/27/1977    Quit date: 01/27/1979  . Smokeless tobacco: Never Used     Comment: Quit in 1980  . Alcohol Use: No   OB History    No data available     Review of Systems  Constitutional: Negative  for fever and chills.  Musculoskeletal: Negative for back pain, joint swelling and arthralgias.  Skin: Positive for wound.       Laceration   Neurological: Negative for dizziness, weakness and numbness.  Hematological: Does not bruise/bleed easily.  All other systems reviewed and are negative.     Allergies  Bee venom; Fentanyl and related; Hymenoptera venom preparations; and Versed  Home Medications   Prior to Admission medications   Medication Sig Start Date End Date Taking? Authorizing Provider  acyclovir (ZOVIRAX) 400 MG tablet Take 400 mg by mouth daily.    Historical Provider, MD  citalopram (CELEXA) 40 MG tablet Take 1 tablet (40 mg total) by mouth daily. 01/28/13   Yehuda Savannah, MD  famotidine (PEPCID) 20 MG tablet Take 1 tablet (20 mg total)  by mouth 2 (two) times daily. 04/11/15   Hope Bunnie Pion, NP  loratadine (CLARITIN) 10 MG tablet Take 1 tablet (10 mg total) by mouth daily. 04/11/15   Hope Bunnie Pion, NP  omeprazole (PRILOSEC) 20 MG capsule Take 1 capsule (20 mg total) by mouth daily. 30 mins before first meal of the day 12/20/14   Carlis Stable, NP  predniSONE (DELTASONE) 10 MG tablet Take 2 tablets (20 mg total) by mouth 2 (two) times daily with a meal. 04/11/15   Hope Bunnie Pion, NP  simvastatin (ZOCOR) 20 MG tablet Take 20 mg by mouth daily.    Historical Provider, MD   BP 129/86 mmHg  Pulse 85  Temp(Src) 99.1 F (37.3 C) (Oral)  Resp 18  Ht 5\' 3"  (1.6 m)  Wt 150 lb (68.04 kg)  BMI 26.58 kg/m2  SpO2 95% Physical Exam  Constitutional: She is oriented to person, place, and time. She appears well-developed and well-nourished. No distress.  HENT:  Head: Normocephalic and atraumatic.  Cardiovascular: Normal rate, regular rhythm, normal heart sounds and intact distal pulses.   No murmur heard. Pulmonary/Chest: Effort normal and breath sounds normal. No respiratory distress.  Musculoskeletal: Normal range of motion. She exhibits no edema or tenderness.  Neurological: She is alert and oriented to person, place, and time. She exhibits normal muscle tone. Coordination normal.  Skin: Skin is warm. Laceration noted.  2 cm laceration to the anterior left lower leg.  Mild bleeding present.  No FB's, no edema.    Nursing note and vitals reviewed.   ED Course  Procedures (including critical care time)  LACERATION REPAIR Performed by: Abdulkareem Badolato L. Authorized by: Hale Bogus Consent: Verbal consent obtained. Risks and benefits: risks, benefits and alternatives were discussed Consent given by: patient Patient identity confirmed: provided demographic data Prepped and Draped in normal sterile fashion Wound explored  Laceration Location: left lower leg  Laceration Length: 2 cm  No Foreign Bodies seen or  palpated  Anesthesia: local infiltration  Local anesthetic: lidocaine 1% with epinephrine  Anesthetic total: 2 ml  Irrigation method: syringe Amount of cleaning: standard  Skin closure: 4-0 prolene  Number of sutures: 3  Technique: simple interrupted  Patient tolerance: Patient tolerated the procedure well with no immediate complications.   MDM   Final diagnoses:  Laceration    Td updated, wound bandaged, bleeding successfully controlled with sutures.  Pt agrees to proper wound care instructions, sutures out in 8-10 days.  Advised to return for any signs of infection   Kem Parkinson, PA-C 09/06/15 0052  Tanna Furry, MD 09/09/15 [phone]

## 2015-09-05 NOTE — Discharge Instructions (Signed)

## 2015-10-10 ENCOUNTER — Telehealth: Payer: Self-pay | Admitting: Orthopedic Surgery

## 2015-10-10 NOTE — Telephone Encounter (Signed)
Patient requests appointment for new problem of achilles tendonitis, both right and left; states may have been seen about 3 years ago for this, at [facility]. Discussed protocol regarding if treated within past 3 years, Dr Aline Brochure would need records and films for review, prior to scheduling; patient will contact [facility] records.  Her ph# is [phone]

## 2015-10-12 ENCOUNTER — Encounter: Payer: Self-pay | Admitting: Gastroenterology

## 2015-10-12 ENCOUNTER — Ambulatory Visit (INDEPENDENT_AMBULATORY_CARE_PROVIDER_SITE_OTHER): Payer: Medicare Other | Admitting: Gastroenterology

## 2015-10-12 VITALS — BP 119/76 | HR 85 | Temp 97.3°F | Ht 63.0 in | Wt 151.8 lb

## 2015-10-12 DIAGNOSIS — R1013 Epigastric pain: Secondary | ICD-10-CM

## 2015-10-12 NOTE — Patient Instructions (Addendum)
Metamucil GUMMIES 2 WITH YOUR FIRST MEAL FOR ONE WEEK THEN 2 WITH BREAKFAST AND LUNCH.  DRINK WATER TO KEEP YOUR URINE LIGHT YELLOW.  TO HELP WITH ABDOMINAL PAIN, ADD ONE OR BOTH FDGARD AND/OR ADD LACTASE 3 PILLS WITH MEALS THREE TIMES A DAY.  FOLLOW UP IN 3 MOS.   PLEASE CALL WITH QUESTIONS OR CONCERNS.

## 2015-10-12 NOTE — Assessment & Plan Note (Addendum)
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 SYMPTOMS NOT IDEALLY CONTROLLED. REVIEWED CT FEB 2015. EGD/TCS UP TO DATE.  Metamucil GUMMIES 2 WITH YOUR FIRST MEAL FOR ONE WEEK THEN 2 WITH BREAKFAST AND LUNCH. DRINK WATER TO KEEP YOUR URINE LIGHT YELLOW. TO HELP WITH ABDOMINAL PAIN, ADD ONE OR BOTH FDGARD AND/OR ADD LACTASE 3 PILLS WITH MEALS THREE TIMES A DAY. FOLLOW UP IN 3 MOS.  CALL WITH QUESTIONS OR CONCERNS.

## 2015-10-12 NOTE — Progress Notes (Signed)
ON RECALL  °

## 2015-10-12 NOTE — Progress Notes (Signed)
Subjective:    Patient ID: Megan Ingram, female    DOB: 07-Jan-1946, 69 y.o.   MRN: [id number] [name], NP  HPI LAST CT FEB 2015. APPETITE GOOD. 1-2x/mo DULL ACHY EPIGASTRIC PAIN-OFF AND ON FOR PAST YEAR(SAME). GOES 4-5 DAY: NL SOFT STOOLS. THE REST OF THE MO EVERY TIME SHE URINATES HAS FORMED AND IN LITTLE PIECES AND HAVING TO WIPE 5-6 TIMES A DAY. WIPES 5-6 TIMES A DAY AND HER BUTT IS GETTING SORE. DIETS: NOT A LOT OF FIBER. STOPPED TAKING PROBIOTIC CAUSE SHE THOUGH IT WAS MAKING IT WORSE. MAIN MEAL DINNER-POT PIE OR PIZZA. ATE FRIENDS MORE FIBER. NO DIARRHEA SINCE NO LINZESS. MILK: NONE, CHEESE: 2-3 TIMES/WEEK   ICE CREAM: 4 TIMES A WEEK. EATS OUT 1 OR 2 TIMES A WEEK FROM [place]. [place]. ABDOMEN ALWAYS TENDER TO PALPATION. NO WEIGHT LOSS. 3 WEEK SUGAR FAST.  PT DENIES FEVER, CHILLS, HEMATOCHEZIA, HEMATEMESIS, nausea, vomiting, melena, diarrhea, CHEST PAIN, SHORTNESS OF BREATH, CHANGE IN BOWEL IN HABITS, constipation, problems swallowing, OR heartburn or indigestion.  Past Medical History  Diagnosis Date  . Chest pain   . Gastroesophageal reflux disease   . Genital herpes   . Endometriosis   . Degenerative joint disease   . Osteopenia   . Depression   . Migraines     Ceased at menopause  . Complication of anesthesia   . PONV (postoperative nausea and vomiting)    Past Surgical History  Procedure Laterality Date  . Cesarean section  1968  . Colonoscopy  2000    incomplete due to pain  . Salpingoophorectomy  1971  . Abdominal hysterectomy      Single ovary remained in place.  . Cataract extraction  2012    bilateral; with lens implant  . Rotator cuff repair  2013    Left  . Bunionectomy  2007    Bilateral  . Abdominal surgery    . Exploratory laparotomy      ovarian ectopic pregnancy  . Colonoscopy with propofol N/A 04/26/2014    Procedure: COLONOSCOPY WITH PROPOFOL;  Surgeon: Danie Binder, MD;  Location: [facility];  Service: Endoscopy;   Laterality: N/A;  in cecum at 0810 out at 0839 = 29 total minutes  . Esophagogastroduodenoscopy (egd) with propofol N/A 04/26/2014    Procedure: ESOPHAGOGASTRODUODENOSCOPY (EGD) WITH PROPOFOL;  Surgeon: Danie Binder, MD;  Location: [facility];  Service: Endoscopy;  Laterality: N/A;  . Savory dilation N/A 04/26/2014    Procedure: SAVORY DILATION;  Surgeon: Danie Binder, MD;  Location: [facility];  Service: Endoscopy;  Laterality: N/A;  14/15/16  . Polypectomy N/A 04/26/2014    Procedure: POLYPECTOMY;  Surgeon: Danie Binder, MD;  Location: [facility];  Service: Endoscopy;  Laterality: N/A;  . Esophageal biopsy N/A 04/26/2014    Procedure: GASTRIC BIOPSY;  Surgeon: Danie Binder, MD;  Location: [facility];  Service: Endoscopy;  Laterality: N/A;   Allergies  Allergen Reactions  . Bee Venom Anaphylaxis  . Fentanyl And Related Hives  . Hymenoptera Venom Preparations   . Versed [Midazolam] Hives   Current Outpatient Prescriptions  Medication Sig Dispense Refill  . acyclovir (ZOVIRAX) 400 MG tablet Take 400 mg by mouth daily.    . citalopram (CELEXA) 40 MG tablet Take 1 tablet (40 mg total) by mouth daily.    . famotidine (PEPCID) 20 MG tablet Take 1 tablet (20 mg total) by mouth 2 (two) times daily.    Marland Kitchen omeprazole (  PRILOSEC) 20 MG capsule Take 1 capsule (20 mg total) by mouth daily. 30 mins before first meal of the day    . simvastatin (ZOCOR) 20 MG tablet Take 20 mg by mouth daily.    .      .       Review of Systems PER HPI OTHERWISE ALL SYSTEMS ARE NEGATIVE.    Objective:   Physical Exam  Constitutional: She is oriented to person, place, and time. She appears well-developed and well-nourished. No distress.  HENT:  Head: Normocephalic and atraumatic.  Mouth/Throat: Oropharynx is clear and moist. No oropharyngeal exudate.  Eyes: Pupils are equal, round, and reactive to light. No scleral icterus.  Neck: Normal range of motion. Neck supple.  Cardiovascular: Normal rate, regular rhythm and normal heart  sounds.   Pulmonary/Chest: Effort normal and breath sounds normal. No respiratory distress.  Abdominal: Soft. Bowel sounds are normal. She exhibits no distension. There is no tenderness.  Musculoskeletal: She exhibits no edema.  Lymphadenopathy:    She has no cervical adenopathy.  Neurological: She is alert and oriented to person, place, and time.  NO FOCAL DEFICITS  Psychiatric:  SLIGHTLY ANXIOUS MOOD, NL AFFECT  Vitals reviewed.         Assessment & Plan:

## 2015-10-12 NOTE — Progress Notes (Signed)
CC'D TO PCP °

## 2015-11-02 NOTE — Telephone Encounter (Signed)
No further response. 

## 2015-11-20 ENCOUNTER — Other Ambulatory Visit: Payer: Self-pay | Admitting: Nurse Practitioner

## 2015-12-06 ENCOUNTER — Ambulatory Visit (INDEPENDENT_AMBULATORY_CARE_PROVIDER_SITE_OTHER): Payer: Medicare Other | Admitting: Orthopedic Surgery

## 2015-12-06 ENCOUNTER — Ambulatory Visit (INDEPENDENT_AMBULATORY_CARE_PROVIDER_SITE_OTHER): Payer: Medicare Other

## 2015-12-06 ENCOUNTER — Ambulatory Visit: Payer: Medicare Other

## 2015-12-06 VITALS — BP 132/72 | Ht 63.0 in | Wt 151.0 lb

## 2015-12-06 DIAGNOSIS — M7662 Achilles tendinitis, left leg: Secondary | ICD-10-CM | POA: Diagnosis not present

## 2015-12-06 DIAGNOSIS — M25571 Pain in right ankle and joints of right foot: Secondary | ICD-10-CM

## 2015-12-06 DIAGNOSIS — M25572 Pain in left ankle and joints of left foot: Secondary | ICD-10-CM

## 2015-12-06 DIAGNOSIS — M7661 Achilles tendinitis, right leg: Secondary | ICD-10-CM | POA: Insufficient documentation

## 2015-12-06 NOTE — Progress Notes (Signed)
Established patient new problem  Chief Complaint  Patient presents with  . Tendonitis    eval bilateral achilles tendonitis    HPI Comments: Bilateral ankle pain primarily along the Achilles tendon with history of Achilles tendinitis starting about 5 years ago flared up again in August 2016 here for evaluation and treatment recommendations  Symptoms pain and stiffness. Pain is described as dull. Denies numbness or tingling. The patient gets the pain in the morning.  Intensity varies modification given.  she had x-rays at [facility]  she took naproxen. she used ice rest and wearing an elevated heel with a clogged. symptoms are worse with flat shoes  she also gives history of being on quinolone antibiotic therapy which may have exacerbated the problem as well.  history of sinusitis depression which is stable back pain swollen joints joint pain seasonal allergies burning pain in the legs recently switch from to left  Past Medical History  Diagnosis Date  . Chest pain   . Gastroesophageal reflux disease   . Genital herpes   . Endometriosis   . Degenerative joint disease   . Osteopenia   . Depression   . Migraines     Ceased at menopause  . Complication of anesthesia   . PONV (postoperative nausea and vomiting)    Past Surgical History  Procedure Laterality Date  . Cesarean section  1968  . Colonoscopy  2000    incomplete due to pain  . Salpingoophorectomy  1971  . Abdominal hysterectomy      Single ovary remained in place.  . Cataract extraction  2012    bilateral; with lens implant  . Rotator cuff repair  2013    Left  . Bunionectomy  2007    Bilateral  . Abdominal surgery    . Exploratory laparotomy      ovarian ectopic pregnancy  . Colonoscopy with propofol N/A 04/26/2014    Procedure: COLONOSCOPY WITH PROPOFOL;  Surgeon: Danie Binder, MD;  Location: [facility];  Service: Endoscopy;  Laterality: N/A;  in cecum at 0810 out at 0839 = 29 total minutes  .  Esophagogastroduodenoscopy (egd) with propofol N/A 04/26/2014    Procedure: ESOPHAGOGASTRODUODENOSCOPY (EGD) WITH PROPOFOL;  Surgeon: Danie Binder, MD;  Location: [facility];  Service: Endoscopy;  Laterality: N/A;  . Savory dilation N/A 04/26/2014    Procedure: SAVORY DILATION;  Surgeon: Danie Binder, MD;  Location: [facility];  Service: Endoscopy;  Laterality: N/A;  14/15/16  . Polypectomy N/A 04/26/2014    Procedure: POLYPECTOMY;  Surgeon: Danie Binder, MD;  Location: [facility];  Service: Endoscopy;  Laterality: N/A;  . Esophageal biopsy N/A 04/26/2014    Procedure: GASTRIC BIOPSY;  Surgeon: Danie Binder, MD;  Location: [facility];  Service: Endoscopy;  Laterality: N/A;   BP 132/72 mmHg  Ht 5\' 3"  (1.6 m)  Wt 151 lb (68.493 kg)  BMI 26.76 kg/m2 Well-developed well-nourished woman hygiene normal oriented 3 mood affect normal  right achilles tendon is swollen thickened palpable defects normal range of motion no stiffness detected ankle joint ankle joint stable plantar flexion strength normal skin intact sensation normal vascularity normal  Achilles also thickened swollen no tenderness full range of motion at the ankle joint stability tests were normal motor exam intact neurovascular exam intact skin normal  X-ray show calcifications near the Achilles insertion in both feet after x-rays were taken today  3 views both ankles please see my report 5 independently interpreted these as normal with the exception  of the calcifications in the Achilles tendon area on both sides  Recommend stretching patient instructed on how to stretch. Continue elevated heel  Follow-up if symptoms worsen and she does not respond to rest ice and anti-inflammatory medication.

## 2015-12-07 ENCOUNTER — Ambulatory Visit: Payer: Medicare Other | Admitting: Orthopedic Surgery

## 2015-12-12 ENCOUNTER — Encounter: Payer: Self-pay | Admitting: Gastroenterology

## 2016-02-07 ENCOUNTER — Encounter: Payer: Self-pay | Admitting: Gastroenterology

## 2016-02-07 ENCOUNTER — Ambulatory Visit (INDEPENDENT_AMBULATORY_CARE_PROVIDER_SITE_OTHER): Payer: Medicare Other | Admitting: Gastroenterology

## 2016-02-07 VITALS — BP 104/70 | HR 81 | Temp 98.3°F | Ht 63.0 in | Wt 148.0 lb

## 2016-02-07 DIAGNOSIS — K219 Gastro-esophageal reflux disease without esophagitis: Secondary | ICD-10-CM

## 2016-02-07 DIAGNOSIS — R1013 Epigastric pain: Secondary | ICD-10-CM

## 2016-02-07 MED ORDER — DICYCLOMINE HCL 10 MG PO CAPS: ORAL_CAPSULE | ORAL | Status: DC

## 2016-02-07 NOTE — Progress Notes (Signed)
Subjective:    Patient ID: Megan Ingram, female    DOB: 1946-04-03, 70 y.o.   MRN: [id number]  Megan Rival, NP  HPI Has small squiggly stools and may not know she needs to have a bm. Going through Fiber gummies. FIGHTING TONSILLITIS RIGHT NOW. A COUPLE TIME RLQ PAIN BUT DIDN'T FEEL SICK CAME AND WENT AWAY ON IT'S OWN. Would like to have regular stools and not constant worrying about stools come out and she know it. Had a rectal exam with gyn and has normal sphincter tone. The little bits that she doesn't feel coming. BMS: DAILY BUT SMALL CALIBER AND GUMMIES DIDN'T CHANGE THAT. LARGE AMOUNT OF FLATULENCE. NO BLOATING. FEELS A LITTLE SOB.   PT DENIES FEVER, CHILLS, HEMATOCHEZIA, nausea, vomiting, melena, CHEST PAIN, CHANGE IN BOWEL IN HABITS, constipation, abdominal pain, problems swallowing, OR heartburn or indigestion.  Past Medical History  Diagnosis Date  . Chest pain   . Gastroesophageal reflux disease   . Genital herpes   . Endometriosis   . Degenerative joint disease   . Osteopenia   . Depression   . Migraines     Ceased at menopause  . Complication of anesthesia   . PONV (postoperative nausea and vomiting)    Past Surgical History  Procedure Laterality Date  . Cesarean section  1968  . Colonoscopy  2000    incomplete due to pain  . Salpingoophorectomy  1971  . Abdominal hysterectomy      Single ovary remained in place.  . Cataract extraction  2012    bilateral; with lens implant  . Rotator cuff repair  2013    Left  . Bunionectomy  2007    Bilateral  . Abdominal surgery    . Exploratory laparotomy      ovarian ectopic pregnancy  . Colonoscopy with propofol N/A 04/26/2014      . Esophagogastroduodenoscopy (egd) with propofol N/A 04/26/2014      . Savory dilation N/A 04/26/2014      . Polypectomy N/A 04/26/2014      . Biopsy N/A 04/26/2014       Allergies  Allergen Reactions  . Bee Venom Anaphylaxis  . Fentanyl And Related Hives  . Versed [Midazolam]  Hives   Current Outpatient Prescriptions  Medication Sig Dispense Refill  . acyclovir (ZOVIRAX) 400 MG tablet Take 400 mg by mouth daily.    . (ZYRTEC-D PO) Take by mouth.    . citalopram (CELEXA) 40 MG tablet Take 1 tablet (40 mg total) by mouth daily.    Marland Kitchen FIBER SELECT GUMMIES PO Take by mouth.    .  (NORCO/VICODIN) 5-325 MG tablet Take 1 tablet PO Q6H as needed for moderate pain.    . naproxen sodium 220 MG tablet Take 220 mg by mouth 2 (two) times daily with a meal.    . omeprazole 20 MG capsule TAKE 30 MINUTES BEFORE FIRST MEAL OF THE DAY.    . simvastatin (ZOCOR) 20 MG tablet Take 20 mg by mouth daily.     Review of Systems PER HPI OTHERWISE ALL SYSTEMS ARE NEGATIVE.    Objective:   Physical Exam  Constitutional: She is oriented to person, place, and time. She appears well-developed and well-nourished. No distress.  HENT:  Head: Normocephalic and atraumatic.  Mouth/Throat: Oropharynx is clear and moist. No oropharyngeal exudate.  Eyes: Pupils are equal, round, and reactive to light. No scleral icterus.  Neck: Normal range of motion. Neck supple.  Cardiovascular: Normal rate,  regular rhythm and normal heart sounds.   Pulmonary/Chest: Effort normal and breath sounds normal. No respiratory distress.  Abdominal: Soft. Bowel sounds are normal. She exhibits no distension. There is tenderness. There is no rebound and no guarding.  MILD TO MODERATE TENDERNESS TO PALPATION  Musculoskeletal: She exhibits no edema.  Lymphadenopathy:    She has no cervical adenopathy.  Neurological: She is alert and oriented to person, place, and time.  NO FOCAL DEFICITS  Psychiatric: She has a normal mood and affect.  Vitals reviewed.     Assessment & Plan:

## 2016-02-07 NOTE — Assessment & Plan Note (Addendum)
SYMPTOMS FAIRLY WELL CONTROLLED.  DRINK WATER TO KEEP YOUR URINE LIGHT YELLOW. USE FIBER POWDER OR 1 PACKET ONCE DAILY FOR 3 DAYS THEN TWICE DAILY. AVOID HIGHER DOSES IF IT CAUSES BLOATING & GAS. TAKE IBGARD 2 DAILY WITH 8 OZ OF WATER. ADD BENTYL 30 MINS PRIOR TO BREAKFAST FOLLOW UP IN 4 MOS.

## 2016-02-07 NOTE — Progress Notes (Signed)
ON RECALL  °

## 2016-02-07 NOTE — Assessment & Plan Note (Signed)
SYMPTOMS FAIRLY WELL CONTROLLED.  Use Prilosec 30 minutes prior to your first meal. FOLLOW UP IN 6 MOS.

## 2016-02-07 NOTE — Patient Instructions (Addendum)
DRINK WATER TO KEEP YOUR URINE LIGHT YELLOW.  USE FIBER POWDER OR 1 PACKET ONCE DAILY FOR 3 DAYS THEN TWICE DAILY. AVOID HIGHER DOSES IF IT CAUSES BLOATING & GAS.  TAKE IBGARD 2 DAILY WITH 8 OZ OF WATER.  START BENTYL 10 MG 30 MINUTES PRIOR TO BREAKFAST. IT MAY CAUSE DRY MOUTH/EYES, DROWSINESS, DIFFICULTY URINATING, OR BLURRY VISION.  FOLLOW UP IN 4 MOS.

## 2016-02-08 NOTE — Progress Notes (Signed)
cc'ed to pcp °

## 2016-03-23 HISTORY — PX: SEPTOPLASTY: SUR1290

## 2016-05-06 ENCOUNTER — Encounter: Payer: Self-pay | Admitting: Gastroenterology

## 2016-07-16 ENCOUNTER — Other Ambulatory Visit (HOSPITAL_COMMUNITY): Payer: Self-pay | Admitting: Nurse Practitioner

## 2016-07-16 ENCOUNTER — Ambulatory Visit (HOSPITAL_COMMUNITY)
Admission: RE | Admit: 2016-07-16 | Discharge: 2016-07-16 | Disposition: A | Discharge status: Home / Self Care | Payer: Medicare Other | Source: Ambulatory Visit | Attending: Nurse Practitioner | Admitting: Nurse Practitioner

## 2016-07-16 DIAGNOSIS — M19042 Primary osteoarthritis, left hand: Secondary | ICD-10-CM | POA: Insufficient documentation

## 2016-07-16 DIAGNOSIS — M79642 Pain in left hand: Secondary | ICD-10-CM

## 2016-08-07 ENCOUNTER — Ambulatory Visit (INDEPENDENT_AMBULATORY_CARE_PROVIDER_SITE_OTHER): Payer: Medicare Other | Admitting: Nurse Practitioner

## 2016-08-07 ENCOUNTER — Encounter: Payer: Self-pay | Admitting: Nurse Practitioner

## 2016-08-07 VITALS — BP 122/73 | HR 79 | Temp 97.1°F | Ht 63.0 in | Wt 151.2 lb

## 2016-08-07 DIAGNOSIS — R1013 Epigastric pain: Secondary | ICD-10-CM | POA: Diagnosis not present

## 2016-08-07 DIAGNOSIS — K219 Gastro-esophageal reflux disease without esophagitis: Secondary | ICD-10-CM

## 2016-08-07 MED ORDER — LIDOCAINE VISCOUS 2 % MT SOLN: 10.0000 mL | Freq: Three times a day (TID) | OROMUCOSAL | 1 refills | Status: DC | PRN

## 2016-08-07 MED ORDER — DEXLANSOPRAZOLE 60 MG PO CPDR: 60.0000 mg | DELAYED_RELEASE_CAPSULE | Freq: Every day | ORAL | 0 refills | Status: DC

## 2016-08-07 NOTE — Assessment & Plan Note (Signed)
Long-standing history of GERD. EGD completed 2015 with gastritis, duodenitis. Has been on PPIs over the years including Protonix, Nexium, currently on Prilosec. Has also tried Zantac. Has failed other options. Continues with epigastric pain as noted below. I will switch her to Dexilant 60 mg a day with samples for 2 weeks and call with a progress report. I will also send in viscous lidocaine 10 mL up to 3 times a day as needed. Return for follow-up in 4-6 weeks.

## 2016-08-07 NOTE — Patient Instructions (Signed)
1. Stop taking Prilosec. 2. We will give you samples of Dexon 60 mg. Take 1 pill, once a day, 30 minutes before your first meal the day. 3. Call with a progress report in about 2 weeks. 4. I sent in viscous lidocaine to your pharmacy. Swish and swallow 10 mL as needed for exacerbation of epigastric pain. Next and return for follow-up in 4-6 weeks.

## 2016-08-07 NOTE — Progress Notes (Signed)
Referring Provider: [name], NP Primary Care Physician:  [name], NP Primary GI:  Dr. Oneida Alar  Chief Complaint  Patient presents with  . Abdominal Pain    upper abd pain x 1 month    HPI:   Megan Ingram is a 70 y.o. female who presents for abdominal pain. The patient was last seen in our office 2/15/ 2017 for dyspepsia and GERD. Recommended adequate hydration, fiber powder once daily for 3 days and twice daily and avoid higher doses if it causes bloating or gas, take IBGard twice daily with water, add Bentyl 30 minutes prior to breakfast. Also recommended Prilosec 30 minutes prior to first meal the day. Follow-up in 6 months. Last EGD 2015 with moderate gastritis and duodenitis.   Today she states she's not feeling well today. Having abdominal pain intermittently epigastric, dull pain which lasts from 2-6 days. Also with 2-3 days of RLQ sharp pain and "never felt real sick of clammy like people tend to do when they have appendicitis." Tried Metamucil but couldn't tolerate and is taking citrucel and probiotic instead. Has sensitive stomach and couldn't tolerate IBGard. Is still taking Prilosec, currently once a day. Feels like she's having more delayed gastric emptying. No dysphagia or odynophagia. Denies N/V, hematochezia, melena, fever, chills, unintentional weight loss. Denies chest pain, dyspnea, dizziness, lightheadedness, syncope, near syncope. Denies any other upper or lower GI symptoms.  Takes EC ASA daily. Takes Naproxen about three times a week. No other NSAIDs or ASA powders.  Past Medical History:  Diagnosis Date  . Chest pain   . Complication of anesthesia   . Degenerative joint disease   . Depression   . Endometriosis   . Gastroesophageal reflux disease   . Genital herpes   . Migraines    Ceased at menopause  . Osteopenia   . PONV (postoperative nausea and vomiting)     Past Surgical History:  Procedure Laterality Date  . ABDOMINAL  HYSTERECTOMY     Single ovary remained in place.  . ABDOMINAL SURGERY    . BIOPSY N/A 04/26/2014   Procedure: GASTRIC BIOPSY;  Surgeon: Danie Binder, MD;  Location: [facility];  Service: Endoscopy;  Laterality: N/A;  . BUNIONECTOMY  2007   Bilateral  . CATARACT EXTRACTION  2012   bilateral; with lens implant  . [place]  . COLONOSCOPY  2000   incomplete due to pain  . COLONOSCOPY WITH PROPOFOL N/A 04/26/2014   Procedure: COLONOSCOPY WITH PROPOFOL;  Surgeon: Danie Binder, MD;  Location: [facility];  Service: Endoscopy;  Laterality: N/A;  in cecum at 0810 out at 0839 = 29 total minutes  . ESOPHAGOGASTRODUODENOSCOPY (EGD) WITH PROPOFOL N/A 04/26/2014   Procedure: ESOPHAGOGASTRODUODENOSCOPY (EGD) WITH PROPOFOL;  Surgeon: Danie Binder, MD;  Location: [facility];  Service: Endoscopy;  Laterality: N/A;  . EXPLORATORY LAPAROTOMY     ovarian ectopic pregnancy  . POLYPECTOMY N/A 04/26/2014   Procedure: POLYPECTOMY;  Surgeon: Danie Binder, MD;  Location: [facility];  Service: Endoscopy;  Laterality: N/A;  . ROTATOR CUFF REPAIR  2013   Left  . SALPINGOOPHORECTOMY  1971  . SAVORY DILATION N/A 04/26/2014   Procedure: SAVORY DILATION;  Surgeon: Danie Binder, MD;  Location: [facility];  Service: Endoscopy;  Laterality: N/A;  14/15/16  . SEPTOPLASTY  03/23/2016    Current Outpatient Prescriptions  Medication Sig Dispense Refill  . acyclovir (ZOVIRAX) 400 MG tablet Take 400 mg by mouth daily.    Marland Kitchen  aspirin EC 325 MG tablet Take 2 tablets by mouth as needed.    Marland Kitchen buPROPion (WELLBUTRIN XL) 150 MG 24 hr tablet Take 1 tablet by mouth daily.    . citalopram (CELEXA) 40 MG tablet Take 1 tablet (40 mg total) by mouth daily. 30 tablet 11  . clonazePAM (KLONOPIN) 0.5 MG tablet Take 1 tablet by mouth as needed.    Marland Kitchen ESTRING 2 MG vaginal ring 1 Device every 3 (three) months.    Marland Kitchen HYDROcodone-acetaminophen (NORCO/VICODIN) 5-325 MG tablet Take 1 tablet by mouth every 6 (six) hours as needed for moderate pain.    .  Methylcellulose, Laxative, (CITRUCEL PO) Take 1 tablet by mouth 2 (two) times daily.    . naproxen sodium (ANAPROX) 220 MG tablet Take 220 mg by mouth 2 (two) times daily with a meal.    . omeprazole (PRILOSEC) 20 MG capsule TAKE 1 CAPSULE BY MOUTH ONCE DAILY. TAKE 30 MINUTES BEFORE FIRST MEALOF THE DAY. 30 capsule 11  . simvastatin (ZOCOR) 20 MG tablet Take 20 mg by mouth every other day.     Marland Kitchen dexlansoprazole (DEXILANT) 60 MG capsule Take 1 capsule (60 mg total) by mouth daily. 16 capsule 0  . lidocaine (XYLOCAINE) 2 % solution Use as directed 10 mLs in the mouth or throat every 8 (eight) hours as needed (epigastric pain). 100 mL 1   No current facility-administered medications for this visit.     Allergies as of 08/07/2016 - Review Complete 08/07/2016  Allergen Reaction Noted  . Bee venom Anaphylaxis 04/13/2014  . Fentanyl and related Hives 01/27/2013  . Versed [midazolam] Hives 01/27/2013    Family History  Problem Relation Age of Onset  . COPD Mother   . Breast cancer Mother   . Valvular heart disease Mother     aortic valve replacement  . Macular degeneration Mother   . Parkinson's disease Father   . Stroke Paternal Grandfather   . Colon cancer Sister 79    Social History   Social History  . Marital status: Single    Spouse name: N/A  . Number of children: N/A  . Years of education: N/A   Occupational History  . 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 Smoking status: Former Smoker    Packs/day: 0.50    Years: 2.00    Types: Cigarettes    Start date: 01/27/1977    Quit date: 01/27/1979  . Smokeless tobacco: Never Used  . Alcohol use 0.0 oz/week     Comment: Occasional, 1-2 glasses of wine a month  . Drug use: No  . Sexual activity: Yes    Birth control/ protection: Surgical   Other Topics Concern  . None   Social History Narrative  . None    Review of Systems: 10-point ROS negative except as per  HPI.   Physical Exam: BP 122/73   Pulse 79   Temp 97.1 F (36.2 C) (Oral)   Ht 5\' 3"  (1.6 m)   Wt 151 lb 3.2 oz (68.6 kg)   BMI 26.78 kg/m  General:   Alert and oriented. Pleasant and cooperative. Well-nourished and well-developed.  Eyes:  Without icterus, sclera clear and conjunctiva pink.  Ears:  Normal auditory acuity. Cardiovascular:  S1, S2 present without murmurs appreciated. Extremities without clubbing or edema. Respiratory:  Clear to auscultation bilaterally. No wheezes, rales, or rhonchi. No distress.  Gastrointestinal:  +BS, soft, and non-distended. Moderate epigastric  TTP. No HSM noted. No guarding or rebound. No masses appreciated.  Rectal:  Deferred  Musculoskalatal:  Symmetrical without gross deformities. Neurologic:  Alert and oriented x4;  grossly normal neurologically. Psych:  Alert and cooperative. Normal mood and affect. Heme/Lymph/Immune: No excessive bruising noted.    08/07/2016 4:27 PM   Disclaimer: This note was dictated with voice recognition software. Similar sounding words can inadvertently be transcribed and may not be corrected upon review.

## 2016-08-07 NOTE — Assessment & Plan Note (Signed)
Continued epigastric pain which tends last for days at a time. Has tried Protonix, Nexium, Zantac, currently on Prilosec. Continues with symptoms. Has tried and stopped IB guard because it irritates her stomach. I'll switch her to Dexon 60 mg once a day with samples for 2 weeks, call with progress report in 2 weeks. We'll send viscous lidocaine for symptomatically management as needed. Return for follow-up in 4-6 weeks.

## 2016-08-08 NOTE — Progress Notes (Signed)
cc'ed to pcp °

## 2016-08-22 ENCOUNTER — Encounter (HOSPITAL_COMMUNITY): Payer: Self-pay

## 2016-08-22 ENCOUNTER — Emergency Department (HOSPITAL_COMMUNITY)
Admission: EM | Admit: 2016-08-22 | Discharge: 2016-08-22 | Disposition: A | Discharge status: Home / Self Care | Payer: Medicare Other | Attending: Emergency Medicine | Admitting: Emergency Medicine

## 2016-08-22 DIAGNOSIS — Z87891 Personal history of nicotine dependence: Secondary | ICD-10-CM | POA: Diagnosis not present

## 2016-08-22 DIAGNOSIS — T63481A Toxic effect of venom of other arthropod, accidental (unintentional), initial encounter: Secondary | ICD-10-CM

## 2016-08-22 DIAGNOSIS — Z79899 Other long term (current) drug therapy: Secondary | ICD-10-CM | POA: Diagnosis not present

## 2016-08-22 DIAGNOSIS — T63441A Toxic effect of venom of bees, accidental (unintentional), initial encounter: Secondary | ICD-10-CM | POA: Diagnosis not present

## 2016-08-22 DIAGNOSIS — Z7982 Long term (current) use of aspirin: Secondary | ICD-10-CM | POA: Insufficient documentation

## 2016-08-22 MED ORDER — SODIUM CHLORIDE 0.9 % IV SOLN
INTRAVENOUS | Status: DC
  Administered 2016-08-22: 05:00:00 via INTRAVENOUS

## 2016-08-22 MED ORDER — DIPHENHYDRAMINE HCL 50 MG/ML IJ SOLN
25.0000 mg | Freq: Once | INTRAMUSCULAR | Status: AC
  Administered 2016-08-22: 25 mg via INTRAVENOUS
  Filled 2016-08-22: qty 1

## 2016-08-22 MED ORDER — FAMOTIDINE 20 MG PO TABS: 20.0000 mg | ORAL_TABLET | Freq: Two times a day (BID) | ORAL | 0 refills | Status: DC

## 2016-08-22 MED ORDER — FAMOTIDINE IN NACL 20-0.9 MG/50ML-% IV SOLN
20.0000 mg | Freq: Once | INTRAVENOUS | Status: AC
  Administered 2016-08-22: 20 mg via INTRAVENOUS
  Filled 2016-08-22: qty 50

## 2016-08-22 MED ORDER — METHYLPREDNISOLONE SODIUM SUCC 125 MG IJ SOLR
125.0000 mg | Freq: Once | INTRAMUSCULAR | Status: AC
  Administered 2016-08-22: 125 mg via INTRAVENOUS
  Filled 2016-08-22: qty 2

## 2016-08-22 MED ORDER — EPINEPHRINE 0.3 MG/0.3ML IJ SOAJ: 0.3000 mg | Freq: Once | INTRAMUSCULAR | 1 refills | Status: AC

## 2016-08-22 MED ORDER — PREDNISONE 20 MG PO TABS: ORAL_TABLET | ORAL | 0 refills | Status: DC

## 2016-08-22 NOTE — ED Triage Notes (Signed)
Pt states she was stung approx 1:30 pm yesterday by 4 yellow jackets, has swelling, pain, and redness to both lower legs, has taken a total of 75 mg of benadryl.

## 2016-08-22 NOTE — Discharge Instructions (Signed)
Stay cool, heat will make the itching and rash worse. Take the Pepcid and prednisone until gone. You can take Zyrtec 10 mg over-the-counter once a day or take Benadryl 25-50 mg every 6 hours as needed for itching or swelling. You can use ice packs over the bite site areas or baking soda paste for comfort. Recheck if you have difficulty breathing, difficulty swallowing, or you break out in a rash over your whole-body. Use the EpiPen if you feel like you're having trouble breathing or swallowing after a insect bite.

## 2016-08-22 NOTE — ED Provider Notes (Signed)
[facility] DEPT Provider Note   CSN: [id number] Arrival date & time: 08/22/16  0404  Time seen 04:20 AM   History   Chief Complaint Chief Complaint  Patient presents with  . Insect Bite    allergic reaction    HPI Megan Ingram is a 70 y.o. female.  HPI patient reports she was mowing grass yesterday afternoon about 2 PM and she got stung by yellow jackets. She states she's never been stung by them before. She states she has 2 bites around her right foot and ankle, 1 bite on her left knee and one bite on her left proximal lower leg. She states initially was painful but the pain has gone away. Now she just complains of intense itching of all the bite sites. She also states this morning she started getting some itching of her face without rash. She denies any difficulty breathing or swallowing. 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 She took Benadryl when it originally happened and took 2 aspirin and has been putting cortisone cream on the bite sites. She is taking a total 75 mg of Benadryl and the last dose was taken at 11 PM which was 25 mg.   PCP NP [place]  Past Medical History:  Diagnosis Date  . Chest pain   . Complication of anesthesia   . Degenerative joint disease   . Depression   . Endometriosis   . Gastroesophageal reflux disease   . Genital herpes   . Migraines    Ceased at menopause  . Osteopenia   . PONV (postoperative nausea and vomiting)     Patient Active Problem List   Diagnosis Date Noted  . Abdominal pain, epigastric 08/07/2016  . Achilles tendonitis, bilateral 12/06/2015  . Dyspepsia 10/12/2015  . Colon cancer screening 03/23/2014  . Herniated disc 09/23/2013  . Achilles bursitis or tendinitis 09/23/2013  . Back pain 04/28/2013  . Facet joint disease of lumbosacral region 04/28/2013  . Sciatica neuralgia 04/28/2013  . History of diagnostic tests 01/29/2013  .  Chest pain   . Gastroesophageal reflux disease     Past Surgical History:  Procedure Laterality Date  . ABDOMINAL HYSTERECTOMY     Single ovary remained in place.  . ABDOMINAL SURGERY    . BIOPSY N/A 04/26/2014   Procedure: GASTRIC BIOPSY;  Surgeon: Danie Binder, MD;  Location: [facility];  Service: Endoscopy;  Laterality: N/A;  . BUNIONECTOMY  2007   Bilateral  . CATARACT EXTRACTION  2012   bilateral; with lens implant  . [place]  . COLONOSCOPY  2000   incomplete due to pain  . COLONOSCOPY WITH PROPOFOL N/A 04/26/2014   Procedure: COLONOSCOPY WITH PROPOFOL;  Surgeon: Danie Binder, MD;  Location: [facility];  Service: Endoscopy;  Laterality: N/A;  in cecum at 0810 out at 0839 = 29 total minutes  . ESOPHAGOGASTRODUODENOSCOPY (EGD) WITH PROPOFOL N/A 04/26/2014   Procedure: ESOPHAGOGASTRODUODENOSCOPY (EGD) WITH PROPOFOL;  Surgeon: Danie Binder, MD;  Location: [facility];  Service: Endoscopy;  Laterality: N/A;  . EXPLORATORY LAPAROTOMY     ovarian ectopic pregnancy  . POLYPECTOMY N/A 04/26/2014   Procedure: POLYPECTOMY;  Surgeon: Danie Binder, MD;  Location: [facility];  Service: Endoscopy;  Laterality: N/A;  . ROTATOR CUFF REPAIR  2013   Left  . SALPINGOOPHORECTOMY  1971  . SAVORY DILATION N/A 04/26/2014   Procedure: SAVORY DILATION;  Surgeon: Carlyon Prows  Rexene Edison, MD;  Location: [facility];  Service: Endoscopy;  Laterality: N/A;  14/15/16  . SEPTOPLASTY  03/23/2016    OB History    No data available       Home Medications    Prior to Admission medications   Medication Sig Start Date End Date Taking? Authorizing Provider  acyclovir (ZOVIRAX) 400 MG tablet Take 400 mg by mouth daily.   Yes Historical Provider, MD  aspirin EC 325 MG tablet Take 2 tablets by mouth as needed.   Yes Historical Provider, MD  citalopram (CELEXA) 40 MG tablet Take 1 tablet (40 mg total) by mouth daily. 01/28/13  Yes Yehuda Savannah, MD  dexlansoprazole (DEXILANT) 60 MG capsule Take 1 capsule (60 mg total) by  mouth daily. 08/07/16  Yes Carlis Stable, NP  ESTRING 2 MG vaginal ring 1 Device every 3 (three) months. 05/23/16  Yes Historical Provider, MD  HYDROcodone-acetaminophen (NORCO/VICODIN) 5-325 MG tablet Take 1 tablet by mouth every 6 (six) hours as needed for moderate pain.   Yes Historical Provider, MD  Methylcellulose, Laxative, (CITRUCEL PO) Take 1 tablet by mouth 2 (two) times daily.   Yes Historical Provider, MD  naproxen sodium (ANAPROX) 220 MG tablet Take 220 mg by mouth 2 (two) times daily with a meal.   Yes Historical Provider, MD  omeprazole (PRILOSEC) 20 MG capsule TAKE 1 CAPSULE BY MOUTH ONCE DAILY. TAKE 30 MINUTES BEFORE FIRST MEALOF THE DAY. 11/20/15  Yes Carlis Stable, NP  simvastatin (ZOCOR) 20 MG tablet Take 20 mg by mouth every other day.    Yes Historical Provider, MD  buPROPion (WELLBUTRIN XL) 150 MG 24 hr tablet Take 1 tablet by mouth daily. 07/15/16   Historical Provider, MD  clonazePAM (KLONOPIN) 0.5 MG tablet Take 1 tablet by mouth as needed. 08/13/11   Historical Provider, MD  EPINEPHRINE 0.3 mg/0.3 mL IJ SOAJ injection Inject 0.3 mLs (0.3 mg total) into the muscle once. 08/22/16 08/22/16  Rolland Porter, MD  famotidine (PEPCID) 20 MG tablet Take 1 tablet (20 mg total) by mouth 2 (two) times daily. 08/22/16   Rolland Porter, MD  lidocaine (XYLOCAINE) 2 % solution Use as directed 10 mLs in the mouth or throat every 8 (eight) hours as needed (epigastric pain). 08/07/16   Carlis Stable, NP  predniSONE (DELTASONE) 20 MG tablet Take 3 po QD x 3d , then 2 po QD x 3d then 1 po QD x 3d 08/22/16   Rolland Porter, MD    Family History Family History  Problem Relation Age of Onset  . COPD Mother   . Breast cancer Mother   . Valvular heart disease Mother     aortic valve replacement  . Macular degeneration Mother   . Parkinson's disease Father   . Stroke Paternal Grandfather   . Colon cancer Sister 52    Social History Social History  Substance Use Topics  . Smoking status: Former Smoker    Packs/day:  0.50    Years: 2.00    Types: Cigarettes    Start date: 01/27/1977    Quit date: 01/27/1979  . Smokeless tobacco: Never Used  . Alcohol use 0.0 oz/week     Comment: Occasional, 1-2 glasses of wine a month  Retired nephrology nurse   Allergies   Bee venom; Fentanyl and related; and Versed [midazolam]   Review of Systems Review of Systems  All other systems reviewed and are negative.    Physical Exam Updated Vital Signs BP 131/90 (  BP Location: Left Arm)   Pulse 88   Temp 97.7 F (36.5 C) (Oral)   Resp 20   Ht 5\' 3"  (1.6 m)   Wt 149 lb (67.6 kg)   SpO2 96%   BMI 26.39 kg/m   Vital signs normal    Physical Exam  Constitutional: She is oriented to person, place, and time. She appears well-developed and well-nourished.  Non-toxic appearance. She does not appear ill. No distress.  HENT:  Head: Normocephalic and atraumatic.  Right Ear: External ear normal.  Left Ear: External ear normal.  Nose: Nose normal. No mucosal edema or rhinorrhea.  Mouth/Throat: Oropharynx is clear and moist and mucous membranes are normal. No dental abscesses or uvula swelling.  Eyes: Conjunctivae and EOM are normal. Pupils are equal, round, and reactive to light.  Neck: Normal range of motion and full passive range of motion without pain. Neck supple.  Cardiovascular: Normal rate, regular rhythm and normal heart sounds.  Exam reveals no gallop and no friction rub.   No murmur heard. Pulmonary/Chest: Effort normal and breath sounds normal. No respiratory distress. She has no wheezes. She has no rhonchi. She has no rales. She exhibits no tenderness and no crepitus.  Abdominal: Soft. Normal appearance and bowel sounds are normal. She exhibits no distension. There is no tenderness. There is no rebound and no guarding.  Musculoskeletal: Normal range of motion. She exhibits no edema or tenderness.  Moves all extremities well.   Neurological: She is alert and oriented to person, place, and time. She has  normal strength. No cranial nerve deficit.  Skin: Skin is warm, dry and intact. No rash noted. No erythema. No pallor.  Patient has 2 puncture wound areas on the medial aspect of her right foot and right ankle. There is no swelling of those areas and minimal redness. She has some swelling and redness of the medial left knee and the medial proximal left lower leg from 2 other sting sites.  Psychiatric: She has a normal mood and affect. Her speech is normal and behavior is normal. Her mood appears not anxious.  Nursing note and vitals reviewed.        ED Treatments / Results   Procedures Procedures (including critical care time)  Medications Ordered in ED Medications  0.9 %  sodium chloride infusion ( Intravenous New Bag/Given 08/22/16 0448)  methylPREDNISolone sodium succinate (SOLU-MEDROL) 125 mg/2 mL injection 125 mg (125 mg Intravenous Given 08/22/16 0447)  famotidine (PEPCID) IVPB 20 mg premix (20 mg Intravenous New Bag/Given 08/22/16 0448)  diphenhydrAMINE (BENADRYL) injection 25 mg (25 mg Intravenous Given 08/22/16 0446)     Initial Impression / Assessment and Plan / ED Course  I have reviewed the triage vital signs and the nursing notes.  Pertinent labs & imaging results that were available during my care of the patient were reviewed by me and considered in my medical decision making (see chart for details).  Clinical Course   Patient elected to get IV meds since able work quicker. She is noted to be scratching constantly over her bite sites. She was given IV Solu-Medrol, Benadryl, and Pepcid.  Recheck at 5:25 AM patient states her itching is gone. She now just has some soreness from all the scratching. On reexam the redness and swelling especially of her left leg is much improved. We discussed using baking soda paste on the bite site for comfort, using ice packs for comfort, and she will be discharged home on Pepcid and prednisone.  Final  Clinical Impressions(s) / ED Diagnoses    Final diagnoses:  Local reaction to insect sting, accidental or unintentional, initial encounter    New Prescriptions New Prescriptions   EPINEPHRINE 0.3 MG/0.3 ML IJ SOAJ INJECTION    Inject 0.3 mLs (0.3 mg total) into the muscle once.   FAMOTIDINE (PEPCID) 20 MG TABLET    Take 1 tablet (20 mg total) by mouth 2 (two) times daily.   PREDNISONE (DELTASONE) 20 MG TABLET    Take 3 po QD x 3d , then 2 po QD x 3d then 1 po QD x 3d    Plan discharge  Rolland Porter, MD, Barbette Or, MD 08/22/16 [phone]

## 2016-08-27 ENCOUNTER — Telehealth: Payer: Self-pay | Admitting: Gastroenterology

## 2016-08-27 NOTE — Telephone Encounter (Signed)
Pt said the dexilant samples helped and she would like a prescription called into [facility] in [place]

## 2016-08-27 NOTE — Telephone Encounter (Signed)
Routing to refill box  

## 2016-08-28 MED ORDER — DEXLANSOPRAZOLE 60 MG PO CPDR: 60.0000 mg | DELAYED_RELEASE_CAPSULE | Freq: Every day | ORAL | 3 refills | Status: DC

## 2016-08-28 NOTE — Telephone Encounter (Signed)
Completed.

## 2016-08-28 NOTE — Addendum Note (Signed)
Addended by: Annitta Needs on: 08/28/2016 12:47 PM   Modules accepted: Orders

## 2016-08-28 NOTE — Addendum Note (Signed)
Addended by: Annitta Needs on: 08/28/2016 12:38 PM   Modules accepted: Orders

## 2016-09-25 ENCOUNTER — Encounter: Payer: Self-pay | Admitting: Gastroenterology

## 2016-09-25 ENCOUNTER — Ambulatory Visit (INDEPENDENT_AMBULATORY_CARE_PROVIDER_SITE_OTHER): Payer: Medicare Other | Admitting: Gastroenterology

## 2016-09-25 DIAGNOSIS — Z1211 Encounter for screening for malignant neoplasm of colon: Secondary | ICD-10-CM | POA: Diagnosis not present

## 2016-09-25 DIAGNOSIS — R1013 Epigastric pain: Secondary | ICD-10-CM | POA: Diagnosis not present

## 2016-09-25 DIAGNOSIS — K219 Gastro-esophageal reflux disease without esophagitis: Secondary | ICD-10-CM | POA: Diagnosis not present

## 2016-09-25 MED ORDER — DICYCLOMINE HCL 10 MG PO CAPS: ORAL_CAPSULE | ORAL | 11 refills | Status: DC

## 2016-09-25 NOTE — Assessment & Plan Note (Addendum)
SISTER RECENTLY DIED COLON CA. TCS 2015 3 HYPERPLASTIC POLYPS-FAIR TO GOOD PREP.  NEXT COLONOSCOPY IN 5 YEARS W/ MAC.

## 2016-09-25 NOTE — Progress Notes (Signed)
cc'ed to pcp °

## 2016-09-25 NOTE — Progress Notes (Signed)
Subjective:    Patient ID: Megan Ingram, female    DOB: 1946-10-01, 69 y.o.   MRN: [id number]  [name], NP  HPI Pt doing fairly well. SISTER PASSED 2 WEEKS AGO FROM COLON CA AT AGE 59. OCCASIONAL DIARRHEA. USUALLY HAS #5-7 STOOL. OCCASIONAL LOWER ABDOMINAL PAIN PRIOR TO RECTAL URGENCY. WANTS TO KNOW WHEN HER NEXT COLONOSCOPY NEEDS TO BE. LAST ONE MAY 2015 WITH GOOD TO FAIR PREP.  PT DENIES FEVER, CHILLS, HEMATOCHEZIA, nausea, vomiting, melena, CHEST PAIN, SHORTNESS OF BREATH,  CHANGE IN BOWEL IN HABITS, constipation,problems swallowing, OR heartburn or indigestion.  Past Medical History:  Diagnosis Date  . Chest pain   . Complication of anesthesia   . Degenerative joint disease   . Depression   . Endometriosis   . Gastroesophageal reflux disease   . Genital herpes   . Migraines    Ceased at menopause  . Osteopenia   . PONV (postoperative nausea and vomiting)     Past Surgical History:  Procedure Laterality Date  . ABDOMINAL HYSTERECTOMY     Single ovary remained in place.  . ABDOMINAL SURGERY    . BIOPSY N/A 04/26/2014   Procedure: GASTRIC BIOPSY;  Surgeon: Danie Binder, MD;  Location: [facility];  Service: Endoscopy;  Laterality: N/A;  . BUNIONECTOMY  2007   Bilateral  . CATARACT EXTRACTION  2012   bilateral; with lens implant  . [place]  . COLONOSCOPY  2000   incomplete due to pain  . COLONOSCOPY WITH PROPOFOL N/A 04/26/2014   Procedure: COLONOSCOPY WITH PROPOFOL;  Surgeon: Danie Binder, MD;  Location: [facility];  Service: Endoscopy;  Laterality: N/A;  in cecum at 0810 out at 0839 = 29 total minutes  . ESOPHAGOGASTRODUODENOSCOPY (EGD) WITH PROPOFOL N/A 04/26/2014   Procedure: ESOPHAGOGASTRODUODENOSCOPY (EGD) WITH PROPOFOL;  Surgeon: Danie Binder, MD;  Location: [facility];  Service: Endoscopy;  Laterality: N/A;  . EXPLORATORY LAPAROTOMY     ovarian ectopic pregnancy  . POLYPECTOMY N/A 04/26/2014   Procedure: POLYPECTOMY;  Surgeon: Danie Binder,  MD;  Location: [facility];  Service: Endoscopy;  Laterality: N/A;  . ROTATOR CUFF REPAIR  2013   Left  . SALPINGOOPHORECTOMY  1971  . SAVORY DILATION N/A 04/26/2014   Procedure: SAVORY DILATION;  Surgeon: Danie Binder, MD;  Location: [facility];  Service: Endoscopy;  Laterality: N/A;  14/15/16  . SEPTOPLASTY  03/23/2016    Allergies  Allergen Reactions  . Bee Venom Anaphylaxis  . Fentanyl And Related Hives  . Versed [Midazolam] Hives    Current Outpatient Prescriptions  Medication Sig Dispense Refill  . acyclovir (ZOVIRAX) 400 MG tablet Take 400 mg by mouth daily.    Marland Kitchen aspirin EC 325 MG tablet Take 2 tablets by mouth as needed.    Marland Kitchen buPROPion (WELLBUTRIN XL) 150 MG 24 hr tablet Take 1 tablet by mouth daily.    . citalopram (CELEXA) 40 MG tablet Take 1 tablet (40 mg total) by mouth daily.    . clonazePAM (KLONOPIN) 0.5 MG tablet Take 1 tablet by mouth as needed.    Marland Kitchen dexlansoprazole (DEXILANT) 60 MG capsule Take 1 capsule (60 mg total) by mouth daily.    Marland Kitchen ESTRING 2 MG vaginal ring 1 Device every 3 (three) months.    . lidocaine (XYLOCAINE) 2 % solution Use as directed 10 mLs in the mouth or throat every 8 (eight) hours as needed (epigastric pain).    . Methylcellulose, Laxative, (CITRUCEL PO)  Take 1 tablet by mouth 2 (two) times daily.    . naproxen sodium (ANAPROX) 220 MG tablet Take 220 mg by mouth 2 (two) times daily with a meal.    . simvastatin (ZOCOR) 20 MG tablet Take 20 mg by mouth every other day.     Marland Kitchen dexlansoprazole (DEXILANT) 60 MG capsule Take 1 capsule (60 mg total) by mouth daily. (Patient not taking: Reported on 09/25/2016)    . dexlansoprazole (DEXILANT) 60 MG capsule Take 1 capsule (60 mg total) by mouth daily. (Patient not taking: Reported on 09/25/2016)    . famotidine (PEPCID) 20 MG tablet Take 1 tablet (20 mg total) by mouth 2 (two) times daily. (Patient not taking: Reported on 09/25/2016)    . HYDROcodone-acetaminophen (NORCO/VICODIN) 5-325 MG tablet Take 1 tablet by  mouth every 6 (six) hours as needed for moderate pain.    Marland Kitchen omeprazole (PRILOSEC) 20 MG capsule TAKE 1 CAPSULE BY MOUTH ONCE DAILY. TAKE 30 MINUTES BEFORE FIRST MEALOF THE DAY. (Patient not taking: Reported on 09/25/2016)    . predniSONE (DELTASONE) 20 MG tablet Take 3 po QD x 3d , then 2 po QD x 3d then 1 po QD x 3d (Patient not taking: Reported on 09/25/2016)      Review of Systems PER HPI OTHERWISE ALL SYSTEMS ARE NEGATIVE.    Objective:   Physical Exam  Constitutional: She is oriented to person, place, and time. She appears well-developed and well-nourished. No distress.  HENT:  Head: Normocephalic and atraumatic.  Mouth/Throat: Oropharynx is clear and moist. No oropharyngeal exudate.  Eyes: Pupils are equal, round, and reactive to light. No scleral icterus.  Neck: Normal range of motion. Neck supple.  Cardiovascular: Normal rate, regular rhythm and normal heart sounds.   Pulmonary/Chest: Effort normal and breath sounds normal. No respiratory distress.  Abdominal: Soft. Bowel sounds are normal. She exhibits no distension. There is no tenderness.  Musculoskeletal: She exhibits no edema.  Lymphadenopathy:    She has no cervical adenopathy.  Neurological: She is alert and oriented to person, place, and time.  NO FOCAL DEFICITS  Psychiatric: She has a normal mood and affect.  Vitals reviewed.         Assessment & Plan:

## 2016-09-25 NOTE — Assessment & Plan Note (Signed)
SYMPTOMS FAIRLY WELL CONTROLLED.   DRINK WATER TO KEEP YOUR URINE LIGHT YELLOW. CONTINUE PROBIOTIC. FOLLOW A HIGH FIBER DIET. AVOID ITEMS THAT CAUSE BLOATING & GAS.  TAKE DICYCLOMINE 30 MINUTES PRIOR TO BREAKFAST AND LUNCH TO PREVENT DIARRHEA AND ABDOMINAL CRAMPS. IT MAY CAUSE DROWSINESS, DRY EYES/MOUTH, BLURRY VISION, OR DIFFICULTY URINATING.  FOLLOW UP IN 6 MOS.

## 2016-09-25 NOTE — Progress Notes (Signed)
ON RECALL  °

## 2016-09-25 NOTE — Assessment & Plan Note (Signed)
SYMPTOMS CONTROLLED/RESOLVED.  CONTINUE TO MONITOR SYMPTOMS. 

## 2016-09-25 NOTE — Patient Instructions (Signed)
  DRINK WATER TO KEEP YOUR URINE LIGHT YELLOW.  CONTINUE PROBIOTIC.  FOLLOW A HIGH FIBER DIET. AVOID ITEMS THAT CAUSE BLOATING & GAS.  NEXT COLONOSCOPY IN 5 YEARS.  TAKE DICYCLOMINE 30 MINUTES PRIOR TO BREAKFAST AND LUNCH TO PREVENT DIARRHEA AND ABDOMINAL CRAMPS. IT MAY CAUSE DROWSINESS, DRY EYES/MOUTH, BLURRY VISION, OR DIFFICULTY URINATING.  FOLLOW UP IN 6 MOS.

## 2017-02-25 ENCOUNTER — Telehealth: Payer: Self-pay

## 2017-02-25 ENCOUNTER — Encounter: Payer: Self-pay | Admitting: Gastroenterology

## 2017-02-25 MED ORDER — OMEPRAZOLE 20 MG PO CPDR: DELAYED_RELEASE_CAPSULE | ORAL | 3 refills | Status: DC

## 2017-02-25 NOTE — Telephone Encounter (Signed)
Pt called and said her Dexilant copay has gone up from $120.00 every 3 months to $417.00 every 3 months. She just cannot afford.  She would like Rx for Omeprazole sent to [facility] for her, she is going to try that again.   Dr. Oneida Alar, please advise!

## 2017-02-25 NOTE — Telephone Encounter (Signed)
PLEASE CALL PT. Rx sent for CONTINUE OMEPRAZOLE.  TAKE 30 MINUTES PRIOR TO YOUR MEALS TWICE DAILY.

## 2017-02-25 NOTE — Telephone Encounter (Signed)
Patient aware of Rx.  

## 2017-04-03 ENCOUNTER — Other Ambulatory Visit (HOSPITAL_COMMUNITY): Payer: Self-pay | Admitting: Nurse Practitioner

## 2017-04-03 DIAGNOSIS — M545 Low back pain, unspecified: Secondary | ICD-10-CM

## 2017-04-03 DIAGNOSIS — G8929 Other chronic pain: Secondary | ICD-10-CM

## 2017-04-07 ENCOUNTER — Ambulatory Visit (HOSPITAL_COMMUNITY)
Admission: RE | Admit: 2017-04-07 | Discharge: 2017-04-07 | Disposition: A | Discharge status: Home / Self Care | Payer: Medicare Other | Source: Ambulatory Visit | Attending: Nurse Practitioner | Admitting: Nurse Practitioner

## 2017-04-07 DIAGNOSIS — M545 Low back pain: Secondary | ICD-10-CM | POA: Diagnosis present

## 2017-04-07 DIAGNOSIS — M5136 Other intervertebral disc degeneration, lumbar region: Secondary | ICD-10-CM | POA: Diagnosis not present

## 2017-04-07 DIAGNOSIS — G8929 Other chronic pain: Secondary | ICD-10-CM

## 2017-04-07 LAB — POCT I-STAT CREATININE: Creatinine, Ser: 0.8 mg/dL (ref 0.44–1.00)

## 2017-04-07 MED ORDER — GADOBENATE DIMEGLUMINE 529 MG/ML IV SOLN
15.0000 mL | Freq: Once | INTRAVENOUS | Status: AC | PRN
  Administered 2017-04-07: 14 mL via INTRAVENOUS

## 2017-04-10 ENCOUNTER — Encounter: Payer: Self-pay | Admitting: Gastroenterology

## 2017-04-10 ENCOUNTER — Ambulatory Visit (INDEPENDENT_AMBULATORY_CARE_PROVIDER_SITE_OTHER): Payer: Medicare Other | Admitting: Gastroenterology

## 2017-04-10 DIAGNOSIS — R1013 Epigastric pain: Secondary | ICD-10-CM

## 2017-04-10 DIAGNOSIS — K219 Gastro-esophageal reflux disease without esophagitis: Secondary | ICD-10-CM | POA: Diagnosis not present

## 2017-04-10 DIAGNOSIS — Z1211 Encounter for screening for malignant neoplasm of colon: Secondary | ICD-10-CM | POA: Diagnosis not present

## 2017-04-10 MED ORDER — DICYCLOMINE HCL 10 MG PO CAPS: ORAL_CAPSULE | ORAL | 11 refills | Status: DC

## 2017-04-10 NOTE — Addendum Note (Signed)
Addended by: Barney Drain L on: 04/10/2017 10:30 AM   Modules accepted: Orders

## 2017-04-10 NOTE — Patient Instructions (Addendum)
DRINK WATER TO KEEP YOUR URINE LIGHT YELLOW.  FOLLOW A HIGH FIBER DIET. AVOID ITEMS THAT CAUSE ABDOMINAL PAIN, DIARRHEA, BLOATING & GAS.  NEXT COLONOSCOPY IN 5 YEARS (MAY 2020).  TAKE DICYCLOMINE 30 MINUTES PRIOR TO BREAKFAST OR LUNCH TO PREVENT DIARRHEA AND ABDOMINAL CRAMPS. IT MAY CAUSE DROWSINESS, DRY EYES/MOUTH, BLURRY VISION, OR DIFFICULTY URINATING.  FOLLOW UP IN 6 MOS.

## 2017-04-10 NOTE — Assessment & Plan Note (Signed)
SYMPTOMS FAIRLY WELL CONTROLLED.  CONTINUE OMEPRAZOLE.  TAKE 30 MINUTES PRIOR TO YOUR FIRST MEAL. FOLLOW UP IN 6 MOS.   

## 2017-04-10 NOTE — Progress Notes (Signed)
Subjective:    Patient ID: Megan Ingram, female    DOB: [birth year], 71 y.o.   MRN: [id number]  [name], NP  HPI IN FEB 2018 CHANGE IN BOWEL IN HABITS- HAVING UNCONTROLLED DIARRHEA(NO ABDOMINAL PAIN), HAD ACCIDENTS x4. NOW RESOLVED. CHANGE IN DIET: SCARED ABOUT WHAT TO EAT. SCARED TO EAT RUFFAGE- EGGS, TOAST, PASTA, CHICKEN, GRILLED CHEESE. WEIGHT STABLE. Last two mos have been better. Having #5-6, and occasional #4. QUESTIONS ABOUT NEXT COLONOSCOPY. HEARTBURN: NO ON OMEPRAZOLE TAKES BOTH THE IN THE MORNING. UPPER ABDOMINAL PAIN GONE. BENTYL: LIVING ON IT IN FEB 2018. NONE SINCE 6-7 WEEKS. BMs: DAILY-MULTIPLE.   PT DENIES FEVER, CHILLS, HEMATOCHEZIA, HEMATEMESIS, nausea, vomiting, melena, CHEST PAIN, SHORTNESS OF BREATH, constipation, abdominal pain, problems swallowing, OR heartburn or indigestion.  Past Medical History:  Diagnosis Date  . Chest pain   . Complication of anesthesia   . Degenerative joint disease   . Depression   . Endometriosis   . Gastroesophageal reflux disease   . Genital herpes   . Migraines    Ceased at menopause  . Osteopenia   . PONV (postoperative nausea and vomiting)    Past Surgical History:  Procedure Laterality Date  . ABDOMINAL HYSTERECTOMY     Single ovary remained in place.  . ABDOMINAL SURGERY    . BIOPSY N/A 04/26/2014     . BUNIONECTOMY  2007   Bilateral  . CATARACT EXTRACTION  2012   bilateral; with lens implant  . [place]  . COLONOSCOPY  2000   incomplete due to pain  . COLONOSCOPY WITH PROPOFOL N/A 04/26/2014     . ESOPHAGOGASTRODUODENOSCOPY (EGD) WITH PROPOFOL N/A 04/26/2014     . EXPLORATORY LAPAROTOMY     ovarian ectopic pregnancy  . POLYPECTOMY N/A 04/26/2014   Procedure: POLYPECTOMY;  Surgeon: Danie Binder, MD;  Location: [facility];  Service: Endoscopy;  Laterality: N/A;  . ROTATOR CUFF REPAIR  2013   Left  . SALPINGOOPHORECTOMY  1971  . SAVORY DILATION N/A 04/26/2014     . SEPTOPLASTY  03/23/2016    Allergies  Allergen Reactions  . Bee Venom Anaphylaxis  . Fentanyl And Related Hives  . Versed [Midazolam] Hives   Current Outpatient Prescriptions  Medication Sig Dispense Refill  . acyclovir (ZOVIRAX) 400 MG tablet Take 400 mg by mouth daily.    Marland Kitchen aspirin EC 325 MG tablet Take 2 tablets by mouth as needed.    . citalopram (CELEXA) 40 MG tablet Take 1 tablet (40 mg total) by mouth daily.    . clonazePAM (KLONOPIN) 0.5 MG tablet Take 1 tablet by mouth as needed.    . dicyclomine (BENTYL) 10 MG capsule 1 PO 30 MINUTES PRIOR TO BREAKFAST AND LUNCH TO PREVENT ABDOMINAL PAIN OR DIARRHEA.    Marland Kitchen ESTRING 2 MG vaginal ring 1 Device every 3 (three) months.    . lidocaine (XYLOCAINE) 2 % solution Use as directed 10 mLs in the mouth or throat every 8 (eight) hours as needed (epigastric pain).    . Methylcellulose, Laxative, (CITRUCEL PO) Take 1 tablet by mouth 2 (two) times daily.    . naproxen sodium (ANAPROX) 220 MG tablet Take 220 mg by mouth 2 (two) times daily with a meal.    . omeprazole (PRILOSEC) 20 MG capsule 40 mg. 1 po 30 mins prior to meals bid)    . simvastatin (ZOCOR) 20 MG tablet Take 20 mg by mouth every other day.     . traZODone (  DESYREL) 50 MG tablet     . WELLBUTRIN XL 150 MG 24 hr tablet Take 1 tablet by mouth daily.    .      . NORCO/VICODIN 5-325 MG tablet Take 1 tablet by mouth every 6 (six) hours as needed for moderate pain.    .       Review of Systems PER HPI OTHERWISE ALL SYSTEMS ARE NEGATIVE.    Objective:   Physical Exam  Constitutional: She is oriented to person, place, and time. She appears well-developed and well-nourished. No distress.  HENT:  Head: Normocephalic and atraumatic.  Mouth/Throat: Oropharynx is clear and moist. No oropharyngeal exudate.  Eyes: Pupils are equal, round, and reactive to light. No scleral icterus.  Neck: Normal range of motion. Neck supple.  Cardiovascular: Normal rate, regular rhythm and normal heart sounds.   Pulmonary/Chest:  Effort normal and breath sounds normal. No respiratory distress.  Abdominal: Soft. Bowel sounds are normal. She exhibits no distension. There is tenderness. There is no rebound.  MILD TTP IN THE EPIGASTRIUM & RUQ. MODERATE TTP IN LLQ    Musculoskeletal: She exhibits no edema.  Lymphadenopathy:    She has no cervical adenopathy.  Neurological: She is alert and oriented to person, place, and time.  NO FOCAL DEFICITS  Psychiatric: She has a normal mood and affect.  Vitals reviewed.     Assessment & Plan:

## 2017-04-10 NOTE — Assessment & Plan Note (Signed)
FAMILY Hx COLON CA-SISTER HAD COLON CA@ AGE 71.  TCS MAY 2020 IN [place]

## 2017-04-10 NOTE — Progress Notes (Signed)
ON RECALL  °

## 2017-04-10 NOTE — Assessment & Plan Note (Signed)
SYMPTOMS FAIRLY WELL CONTROLLED.  CONTINUE TO MONITOR SYMPTOMS. 

## 2017-04-10 NOTE — Assessment & Plan Note (Signed)
SYMPTOMS FAIRLY WELL CONTROLLED.  DRINK WATER TO KEEP YOUR URINE LIGHT YELLOW. FOLLOW A HIGH FIBER DIET. AVOID ITEMS THAT CAUSE ABDOMINAL PAIN, DIARRHEA, BLOATING & GAS. PROBIOTIC PRN. TAKE DICYCLOMINE 30 MINUTES PRIOR TO BREAKFAST OR LUNCH TO PREVENT DIARRHEA AND ABDOMINAL CRAMPS. IT MAY CAUSE DROWSINESS, DRY EYES/MOUTH, BLURRY VISION, OR DIFFICULTY URINATING. FOLLOW UP IN 6 MOS.

## 2017-04-11 NOTE — Progress Notes (Signed)
cc'ed to pcp °

## 2017-08-19 ENCOUNTER — Telehealth: Payer: Self-pay | Admitting: Gastroenterology

## 2017-08-21 ENCOUNTER — Encounter (HOSPITAL_COMMUNITY): Payer: Self-pay

## 2017-08-21 ENCOUNTER — Other Ambulatory Visit (HOSPITAL_COMMUNITY)
Admission: RE | Admit: 2017-08-21 | Discharge: 2017-08-21 | Disposition: A | Discharge status: Home / Self Care | Payer: Medicare Other | Source: Ambulatory Visit | Attending: Gastroenterology | Admitting: Gastroenterology

## 2017-08-21 ENCOUNTER — Ambulatory Visit (INDEPENDENT_AMBULATORY_CARE_PROVIDER_SITE_OTHER): Payer: Medicare Other | Admitting: Gastroenterology

## 2017-08-21 ENCOUNTER — Encounter: Payer: Self-pay | Admitting: Gastroenterology

## 2017-08-21 ENCOUNTER — Ambulatory Visit (HOSPITAL_COMMUNITY)
Admission: RE | Admit: 2017-08-21 | Discharge: 2017-08-21 | Disposition: A | Discharge status: Home / Self Care | Payer: Medicare Other | Source: Ambulatory Visit | Attending: Gastroenterology | Admitting: Gastroenterology

## 2017-08-21 DIAGNOSIS — I700 Atherosclerosis of aorta: Secondary | ICD-10-CM | POA: Insufficient documentation

## 2017-08-21 DIAGNOSIS — R1013 Epigastric pain: Secondary | ICD-10-CM | POA: Diagnosis present

## 2017-08-21 LAB — COMPREHENSIVE METABOLIC PANEL
ALBUMIN: 3.9 g/dL (ref 3.5–5.0)
ALK PHOS: 73 U/L (ref 38–126)
ALT: 14 U/L (ref 14–54)
ANION GAP: 8 (ref 5–15)
AST: 19 U/L (ref 15–41)
BILIRUBIN TOTAL: 0.5 mg/dL (ref 0.3–1.2)
BUN: 23 mg/dL — AB (ref 6–20)
CALCIUM: 9 mg/dL (ref 8.9–10.3)
CO2: 28 mmol/L (ref 22–32)
Chloride: 106 mmol/L (ref 101–111)
Creatinine, Ser: 0.95 mg/dL (ref 0.44–1.00)
GFR calc Af Amer: >60 mL/min (ref >60)
GFR calc non Af Amer: 59 mL/min — ABNORMAL LOW (ref >60)
GLUCOSE: 96 mg/dL (ref 65–99)
POTASSIUM: 4.2 mmol/L (ref 3.5–5.1)
SODIUM: 142 mmol/L (ref 135–145)
Total Protein: 6.9 g/dL (ref 6.5–8.1)

## 2017-08-21 LAB — LIPASE, BLOOD: Lipase: 36 U/L (ref 11–51)

## 2017-08-21 MED ORDER — IOPAMIDOL (ISOVUE-300) INJECTION 61%
INTRAVENOUS | Status: AC
  Filled 2017-08-21: qty 30

## 2017-08-21 MED ORDER — IOPAMIDOL (ISOVUE-300) INJECTION 61%
100.0000 mL | Freq: Once | INTRAVENOUS | Status: AC | PRN
  Administered 2017-08-21: 100 mL via INTRAVENOUS

## 2017-08-21 NOTE — Progress Notes (Signed)
Subjective:    Patient ID: Megan Ingram, female    DOB: Feb 02, 1946, 71 y.o.   MRN: [id number]  [name], NP  HPI Nausea and epigastric and RLQ ABDOMINAL PAIN. INCREASED SYMPTOMS FOR 3 WEEKS. NO TRIGGERS. LAID OFF GLUTEN AND NOTHING HAPPENED. AVOIDED DAIRY AND NOTHING HAPPENED. PAIN HAPPENS: 5X/WEEK. STARTS RANDOMLY. LASTS FOR HOURS. BETTER: TIME, VICODIN. AT NIGHT PRESSING ON FIRM PILLOW GIVES RELIEF. MILD NAUSEA. FOOD MAKES IT WORSE. DIET IS IRREGULAR AND UNPREDICATABLE. SNACKS A LOT. YESTERDAY: PEACHES/CHEES & CRACKERS. ORANGE JELLO CUPS WITH MANDARIN. EVERY 3-4 DAYS #6 OR #1. EIGHT STABLE FOR ONE YEAR. NO NEW MEDS, OTC, OR HERBAL SUPPLEMENTS. EC ASA 2-3 DOSES A WEEK. ANOTHER 3 DOSES OF EXCEDRIN FOR HER HEAD. NO BC/GOODY POWDERS, IBUPROFEN/MOTRIN, OR NAPROXEN/ALEVE. RLQ LESS FREQUENT AND DON'T NECESSARILY CO-EXIST. HAD LARGE #6 THIS AM.  PT DENIES FEVER, CHILLS, HEMATOCHEZIA, HEMATEMESIS,  vomiting, melena, diarrhea, CHEST PAIN, SHORTNESS OF BREATH,  CHANGE IN BOWEL IN HABITS, constipation, problems swallowing, OR heartburn or indigestion.  Past Medical History:  Diagnosis Date  . Chest pain   . Complication of anesthesia   . Degenerative joint disease   . Depression   . Endometriosis   . Gastroesophageal reflux disease   . Genital herpes   . Migraines    Ceased at menopause  . Osteopenia   . PONV (postoperative nausea and vomiting)    Past Surgical History:  Procedure Laterality Date  . ABDOMINAL HYSTERECTOMY     Single ovary remained in place.  . ABDOMINAL SURGERY    . BIOPSY N/A 04/26/2014   Procedure: GASTRIC BIOPSY;  Surgeon: Danie Binder, MD;  Location: [facility];  Service: Endoscopy;  Laterality: N/A;  . BUNIONECTOMY  2007   Bilateral  . CATARACT EXTRACTION  2012   bilateral; with lens implant  . [place]  . COLONOSCOPY  2000   incomplete due to pain  . COLONOSCOPY WITH PROPOFOL N/A 04/26/2014     . ESOPHAGOGASTRODUODENOSCOPY (EGD) WITH  PROPOFOL N/A 04/26/2014     . EXPLORATORY LAPAROTOMY     ovarian ectopic pregnancy  . POLYPECTOMY N/A 04/26/2014     . ROTATOR CUFF REPAIR  2013   Left  . SALPINGOOPHORECTOMY  1971  . SAVORY DILATION N/A 04/26/2014     . SEPTOPLASTY  03/23/2016   Allergies  Allergen Reactions  . Bee Venom Anaphylaxis  . Fentanyl And Related Hives  . Versed [Midazolam] Hives   Current Outpatient Prescriptions  Medication Sig Dispense Refill  . acyclovir (ZOVIRAX) 400 MG tablet Take 400 mg by mouth daily.    Marland Kitchen aspirin EC 325 MG tablet Take 2 tablets by mouth as needed.    . citalopram (CELEXA) 40 MG tablet Take 1 tablet (40 mg total) by mouth daily. (Patient taking differently: Take 30 mg by mouth daily. )    . clonazePAM (KLONOPIN) 0.5 MG tablet Take 1 tablet by mouth as needed.    . dicyclomine (BENTYL) 10 MG capsule 1 PO 30 MINUTES PRIOR TO BREAKFAST AND LUNCH TO PREVENT ABDOMINAL PAIN OR DIARRHEA.    Marland Kitchen HYDROcodone-acetaminophen (NORCO/VICODIN) 5-325 MG tablet Take 1 tablet by mouth every 6 (six) hours as needed for moderate pain.    . naproxen sodium (ANAPROX) 220 MG tablet Take 220 mg by mouth as needed.  NONE IN 2 WEEKS   . omeprazole (PRILOSEC) 20 MG capsule 2 po 30 mins prior to meals QD    . simvastatin (ZOCOR) 20 MG tablet Take  20 mg by mouth every other day.     . traZODone (DESYREL) 50 MG tablet Take 50 mg by mouth at bedtime.     Marland Kitchen buPROPion (WELLBUTRIN XL) 150 MG 24 hr tablet Take 1 tablet by mouth daily.    Marland Kitchen ESTRING 2 MG vaginal ring 1 Device every 3 (three) months.    .      . lidocaine (XYLOCAINE) 2 % solution Use as directed 10 mLs in the mouth or throat every 8 (eight) hours as needed (epigastric pain).    . Methylcellulose, Laxative, (CITRUCEL PO) Take 1 tablet by mouth 2 (two) times daily.    . predniSONE (DELTASONE) 20 MG tablet Take 3 po QD x 3d , then 2 po QD x 3d then 1 po QD x 3d  LAST DOSE 7 DAYS AGO    Review of Systems PER HPI OTHERWISE ALL SYSTEMS ARE NEGATIVE.      Objective:   Physical Exam  Constitutional: She is oriented to person, place, and time. She appears well-developed and well-nourished. No distress.  HENT:  Head: Normocephalic and atraumatic.  Mouth/Throat: Oropharynx is clear and moist. No oropharyngeal exudate.  Eyes: Pupils are equal, round, and reactive to light. No scleral icterus.  Neck: Normal range of motion. Neck supple.  Cardiovascular: Normal rate, regular rhythm and normal heart sounds.   Pulmonary/Chest: Effort normal and breath sounds normal. No respiratory distress.  Abdominal: Soft. Bowel sounds are normal. She exhibits no distension. There is tenderness. There is guarding. There is no rebound.  MOD TTP IN RUQ, &  IN THE EPIGASTRIUM   Musculoskeletal: She exhibits no edema.  Lymphadenopathy:    She has no cervical adenopathy.  Neurological: She is alert and oriented to person, place, and time.  Psychiatric: She has a normal mood and affect.  Vitals reviewed.     Assessment & Plan:

## 2017-08-21 NOTE — Assessment & Plan Note (Addendum)
SYMPTOMS NOT IDEALLY CONTROLLED DUE TO RECENT STEROIDS AND CHRONIC ASA.NSAID USE. DIFFERENTIAL DIAGNOSIS INCLUDES: ACUTE CHOLECYSTITIS, PANCREATITIS, HEPATITIS, LESS LIKELY UNCONTROLLED GERD, IBS, GASTRIC OR PANCREATIC CA OR CHRONIC MESENTERIC ISCHEMIA    USE VISCOUS LIDOCAINE 2 TSP EVERY FOUR HOURS IF NEEDED FOR FLARES OF HEARTBURN, CHEST PAIN, OR UPPER ABDOMINAL PAIN. CONTINUE OMEPRAZOLE.  TAKE 30 MINUTES PRIOR TO YOUR FIRST MEAL. CONTINUE BENTYL. MINIMIZE ASA/NSAID. CMP/LIPASE TODAY. CT ABD W/ IVC AND ORAL CONTRAST TODAY. FOLLOW UP IN 3 MOS.

## 2017-08-21 NOTE — Progress Notes (Signed)
CC'ED TO PCP 

## 2017-08-21 NOTE — Patient Instructions (Addendum)
Complete CT scan AND LABS.  USE VISCOUS LIDOCAINE 2 TSP EVERY FOUR HOURS IF NEEDED FOR FLARES OF HEARTBURN, CHEST PAIN, OR UPPER ABDOMINAL PAIN.   CONTINUE OMEPRAZOLE.  TAKE 30 MINUTES PRIOR TO YOUR FIRST MEAL.  CONTINUE BENTYL.  FOLLOW UP IN 3 MOS.

## 2017-08-22 ENCOUNTER — Telehealth: Payer: Self-pay | Admitting: Gastroenterology

## 2017-08-22 NOTE — Progress Notes (Signed)
ON RECALL  °

## 2017-08-22 NOTE — Telephone Encounter (Signed)
Called patient TO DISCUSS RESULTS. NL LABS/CT

## 2017-10-02 ENCOUNTER — Ambulatory Visit: Payer: Medicare Other | Admitting: Gastroenterology

## 2017-10-20 ENCOUNTER — Emergency Department (HOSPITAL_COMMUNITY): Payer: Medicare Other

## 2017-10-20 ENCOUNTER — Emergency Department (HOSPITAL_COMMUNITY)
Admission: EM | Admit: 2017-10-20 | Discharge: 2017-10-21 | Disposition: A | Discharge status: Home / Self Care | Payer: Medicare Other | Attending: Emergency Medicine | Admitting: Emergency Medicine

## 2017-10-20 ENCOUNTER — Encounter (HOSPITAL_COMMUNITY): Payer: Self-pay | Admitting: *Deleted

## 2017-10-20 DIAGNOSIS — R0789 Other chest pain: Secondary | ICD-10-CM

## 2017-10-20 DIAGNOSIS — K2900 Acute gastritis without bleeding: Secondary | ICD-10-CM | POA: Insufficient documentation

## 2017-10-20 DIAGNOSIS — R1010 Upper abdominal pain, unspecified: Secondary | ICD-10-CM | POA: Diagnosis not present

## 2017-10-20 DIAGNOSIS — R079 Chest pain, unspecified: Secondary | ICD-10-CM | POA: Diagnosis present

## 2017-10-20 LAB — COMPREHENSIVE METABOLIC PANEL
ALBUMIN: 3.8 g/dL (ref 3.5–5.0)
ALK PHOS: 75 U/L (ref 38–126)
ALT: 17 U/L (ref 14–54)
ANION GAP: 9 (ref 5–15)
AST: 18 U/L (ref 15–41)
BILIRUBIN TOTAL: 0.2 mg/dL — AB (ref 0.3–1.2)
BUN: 19 mg/dL (ref 6–20)
CALCIUM: 8.9 mg/dL (ref 8.9–10.3)
CO2: 25 mmol/L (ref 22–32)
Chloride: 108 mmol/L (ref 101–111)
Creatinine, Ser: 1.02 mg/dL — ABNORMAL HIGH (ref 0.44–1.00)
GFR calc Af Amer: >60 mL/min (ref >60)
GFR calc non Af Amer: 54 mL/min — ABNORMAL LOW (ref >60)
Glucose, Bld: 119 mg/dL — ABNORMAL HIGH (ref 65–99)
Potassium: 3.9 mmol/L (ref 3.5–5.1)
SODIUM: 142 mmol/L (ref 135–145)
Total Protein: 6.7 g/dL (ref 6.5–8.1)

## 2017-10-20 LAB — CBC WITH DIFFERENTIAL/PLATELET
BASOS ABS: 0.1 10*3/uL (ref 0.0–0.1)
BASOS PCT: 1 %
EOS ABS: 0.3 10*3/uL (ref 0.0–0.7)
Eosinophils Relative: 3 %
HEMATOCRIT: 37.9 % (ref 36.0–46.0)
HEMOGLOBIN: 12.6 g/dL (ref 12.0–15.0)
Lymphocytes Relative: 23 %
Lymphs Abs: 2.4 10*3/uL (ref 0.7–4.0)
MCH: 29.7 pg (ref 26.0–34.0)
MCHC: 33.2 g/dL (ref 30.0–36.0)
MCV: 89.4 fL (ref 78.0–100.0)
Monocytes Absolute: 0.7 10*3/uL (ref 0.1–1.0)
Monocytes Relative: 7 %
NEUTROS ABS: 6.9 10*3/uL (ref 1.7–7.7)
NEUTROS PCT: 66 %
Platelets: 266 10*3/uL (ref 150–400)
RBC: 4.24 MIL/uL (ref 3.87–5.11)
RDW: 13.2 % (ref 11.5–15.5)
WBC: 10.4 10*3/uL (ref 4.0–10.5)

## 2017-10-20 LAB — TROPONIN I: Troponin I: <0.03 ng/mL (ref <0.03)

## 2017-10-20 LAB — LIPASE, BLOOD: Lipase: 45 U/L (ref 11–51)

## 2017-10-20 LAB — D-DIMER, QUANTITATIVE (NOT AT ARMC): D DIMER QUANT: 0.5 ug{FEU}/mL (ref 0.00–0.50)

## 2017-10-20 MED ORDER — ASPIRIN 81 MG PO CHEW
324.0000 mg | CHEWABLE_TABLET | Freq: Once | ORAL | Status: AC
  Administered 2017-10-20: 324 mg via ORAL
  Filled 2017-10-20: qty 4

## 2017-10-20 MED ORDER — MORPHINE SULFATE (PF) 4 MG/ML IV SOLN: 4.0000 mg | Freq: Once | INTRAVENOUS | Status: DC

## 2017-10-20 MED ORDER — NITROGLYCERIN 0.4 MG SL SUBL
0.4000 mg | SUBLINGUAL_TABLET | SUBLINGUAL | Status: DC | PRN
  Administered 2017-10-20: 0.4 mg via SUBLINGUAL
  Filled 2017-10-20: qty 1

## 2017-10-20 MED ORDER — MORPHINE SULFATE (PF) 2 MG/ML IV SOLN: 2.0000 mg | Freq: Once | INTRAVENOUS | Status: DC

## 2017-10-20 MED ORDER — GI COCKTAIL ~~LOC~~
30.0000 mL | Freq: Once | ORAL | Status: AC
  Administered 2017-10-20: 30 mL via ORAL
  Filled 2017-10-20: qty 30

## 2017-10-20 NOTE — ED Notes (Signed)
Patient transported to X-ray 

## 2017-10-20 NOTE — Discharge Instructions (Addendum)
Increase your omeprazole to 40 mg twice a day instead of once a day for the next 2 weeks then once a day. Let Dr Nona Dell know if your pain isn't improving. You can try TUMS or Maalox for discomfort between. Return to the ED if you get worse again.

## 2017-10-20 NOTE — ED Provider Notes (Signed)
[facility] EMERGENCY DEPARTMENT Provider Note   CSN: [id number] Arrival date & time: 10/20/17  2043     History   Chief Complaint Chief Complaint  Patient presents with  . Chest Pain    HPI Megan Ingram is a 71 y.o. female.  HPI Patient states she was working in the yard and developed left upper abdominal/lower chest pain this afternoon.  No nausea or vomiting.  No diaphoresis.  States she has mild shortness of breath.  No known trauma.  No radiation.  No medications prior to presentation. Past Medical History:  Diagnosis Date  . Chest pain   . Complication of anesthesia   . Degenerative joint disease   . Depression   . Endometriosis   . Gastroesophageal reflux disease   . Genital herpes   . Migraines    Ceased at menopause  . Osteopenia   . PONV (postoperative nausea and vomiting)     Patient Active Problem List   Diagnosis Date Noted  . Abdominal pain, epigastric 08/07/2016  . Achilles tendonitis, bilateral 12/06/2015  . Dyspepsia 10/12/2015  . Colon cancer screening 03/23/2014  . Herniated disc 09/23/2013  . Achilles bursitis or tendinitis 09/23/2013  . Back pain 04/28/2013  . Facet joint disease of lumbosacral region 04/28/2013  . Sciatica neuralgia 04/28/2013  . History of diagnostic tests 01/29/2013  . Chest pain   . Gastroesophageal reflux disease     Past Surgical History:  Procedure Laterality Date  . ABDOMINAL HYSTERECTOMY     Single ovary remained in place.  . ABDOMINAL SURGERY    . BIOPSY N/A 04/26/2014   Procedure: GASTRIC BIOPSY;  Surgeon: Danie Binder, MD;  Location: [facility];  Service: Endoscopy;  Laterality: N/A;  . BUNIONECTOMY  2007   Bilateral  . CATARACT EXTRACTION  2012   bilateral; with lens implant  . [place]  . COLONOSCOPY  2000   incomplete due to pain  . COLONOSCOPY WITH PROPOFOL N/A 04/26/2014   Procedure: COLONOSCOPY WITH PROPOFOL;  Surgeon: Danie Binder, MD;  Location: [facility];  Service: Endoscopy;   Laterality: N/A;  in cecum at 0810 out at 0839 = 29 total minutes  . ESOPHAGOGASTRODUODENOSCOPY (EGD) WITH PROPOFOL N/A 04/26/2014   Procedure: ESOPHAGOGASTRODUODENOSCOPY (EGD) WITH PROPOFOL;  Surgeon: Danie Binder, MD;  Location: [facility];  Service: Endoscopy;  Laterality: N/A;  . EXPLORATORY LAPAROTOMY     ovarian ectopic pregnancy  . POLYPECTOMY N/A 04/26/2014   Procedure: POLYPECTOMY;  Surgeon: Danie Binder, MD;  Location: [facility];  Service: Endoscopy;  Laterality: N/A;  . ROTATOR CUFF REPAIR  2013   Left  . SALPINGOOPHORECTOMY  1971  . SAVORY DILATION N/A 04/26/2014   Procedure: SAVORY DILATION;  Surgeon: Danie Binder, MD;  Location: [facility];  Service: Endoscopy;  Laterality: N/A;  14/15/16  . SEPTOPLASTY  03/23/2016    OB History    No data available       Home Medications    Prior to Admission medications   Medication Sig Start Date End Date Taking? Authorizing Provider  acyclovir (ZOVIRAX) 400 MG tablet Take 400 mg by mouth daily.   Yes [provider]  aspirin EC 325 MG tablet Take 2 tablets by mouth daily as needed for mild pain or moderate pain.    Yes [provider]  citalopram (CELEXA) 40 MG tablet Take 30 mg by mouth at bedtime.   Yes [provider]  clonazePAM (KLONOPIN) 0.5 MG tablet Take  1 tablet by mouth daily as needed for anxiety.  08/13/11  Yes [provider]  dicyclomine (BENTYL) 10 MG capsule 1 PO 30 MINUTES PRIOR TO BREAKFAST AND LUNCH TO PREVENT ABDOMINAL PAIN OR DIARRHEA. 04/10/17  Yes Fields, Marga Melnick, MD  HYDROcodone-acetaminophen (NORCO/VICODIN) 5-325 MG tablet Take 1 tablet by mouth every 6 (six) hours as needed for moderate pain.   Yes [provider]  predniSONE (DELTASONE) 20 MG tablet Take 20 mg by mouth daily.  10/14/17  Yes [provider]  simvastatin (ZOCOR) 20 MG tablet Take 20 mg by mouth every other day.    Yes [provider]  traZODone (DESYREL) 50 MG tablet Take 50 mg by mouth at  bedtime.  01/31/17  Yes [provider]  omeprazole (PRILOSEC) 40 MG capsule Take 1 po BID x 2 weeks then once a day 10/21/17   Rolland Porter, MD    Family History Family History  Problem Relation Age of Onset  . COPD Mother   . Breast cancer Mother   . Valvular heart disease Mother        aortic valve replacement  . Macular degeneration Mother   . Parkinson's disease Father   . Stroke Paternal Grandfather   . Colon cancer Sister 27    Social History Social History  Substance Use Topics  . Smoking status: Former Smoker    Packs/day: 0.50    Years: 2.00    Types: Cigarettes    Start date: 01/27/1977    Quit date: 01/27/1979  . Smokeless tobacco: Never Used  . Alcohol use 0.0 oz/week     Comment: Occasional, 1-2 glasses of wine a month     Allergies   Bee venom; Fentanyl and related; and Versed [midazolam]   Review of Systems Review of Systems  Constitutional: Negative for chills and fever.  Respiratory: Positive for shortness of breath. Negative for cough.   Cardiovascular: Positive for chest pain. Negative for palpitations and leg swelling.  Gastrointestinal: Positive for abdominal pain. Negative for diarrhea, nausea and vomiting.  Musculoskeletal: Negative for back pain, myalgias, neck pain and neck stiffness.  Neurological: Negative for dizziness, weakness, light-headedness, numbness and headaches.  All other systems reviewed and are negative.    Physical Exam Updated Vital Signs BP (!) 142/82   Pulse 66   Temp 98.4 F (36.9 C) (Oral)   Resp 14   Ht 5\' 3"  (1.6 m)   SpO2 95%   Physical Exam  Constitutional: She is oriented to person, place, and time. She appears well-developed and well-nourished.  HENT:  Head: Normocephalic and atraumatic.  Mouth/Throat: Oropharynx is clear and moist. No oropharyngeal exudate.  Eyes: Pupils are equal, round, and reactive to light. EOM are normal.  Neck: Normal range of motion. Neck supple.  Cardiovascular: Normal rate  and regular rhythm.   Pulmonary/Chest: Effort normal and breath sounds normal. She exhibits tenderness.  Left anterior chest wall tenderness.  No crepitance or deformity.  Abdominal: Soft. Bowel sounds are normal. There is tenderness. There is no rebound and no guarding.  Left upper quadrant and epigastric tenderness to palpation.  Musculoskeletal: Normal range of motion. She exhibits no edema or tenderness.  No lower extremity swelling, asymmetry or tenderness.  No midline thoracic or lumbar tenderness.  No CVA tenderness.  Neurological: She is alert and oriented to person, place, and time.  Moves all extremities without focal deficit.  Sensation fully intact.  Skin: Skin is warm and dry. Capillary refill takes less than 2  seconds. No rash noted. No erythema.  Psychiatric: She has a normal mood and affect. Her behavior is normal.  Nursing note and vitals reviewed.    ED Treatments / Results  Labs (all labs ordered are listed, but only abnormal results are displayed) Labs Reviewed  COMPREHENSIVE METABOLIC PANEL - Abnormal; Notable for the following:       Result Value   Glucose, Bld 119 (*)    Creatinine, Ser 1.02 (*)    Total Bilirubin 0.2 (*)    GFR calc non Af Amer 54 (*)    All other components within normal limits  CBC WITH DIFFERENTIAL/PLATELET  LIPASE, BLOOD  TROPONIN I  D-DIMER, QUANTITATIVE (NOT AT [facility])  TROPONIN I    EKG  EKG Interpretation  Date/Time:  Monday October 20 2017 20:52:34 EDT Ventricular Rate:  77 PR Interval:  120 QRS Duration: 84 QT Interval:  390 QTC Calculation: 441 R Axis:   66 Text Interpretation:  Normal sinus rhythm Normal ECG Confirmed by Julianne Rice [phone]) on 10/20/2017 11:25:22 PM       Radiology No results found.  Procedures Procedures (including critical care time)  Medications Ordered in ED Medications  aspirin chewable tablet 324 mg (324 mg Oral Given 10/20/17 2124)  gi cocktail (Maalox,Lidocaine,Donnatal) (30 mLs  Oral Given 10/20/17 2321)     Initial Impression / Assessment and Plan / ED Course  I have reviewed the triage vital signs and the nursing notes.  Pertinent labs & imaging results that were available during my care of the patient were reviewed by me and considered in my medical decision making (see chart for details).     No improvement after aspirin and nitro.  Will attempt GI cocktail.  Initial workup is negative.  Will need repeat troponin.  Suspect symptoms are likely gastrointestinal in nature. Signed out to oncoming emergency physician pending repeat troponin. Final Clinical Impressions(s) / ED Diagnoses   Final diagnoses:  Atypical chest pain  Upper abdominal pain  Acute gastritis without hemorrhage, unspecified gastritis type    New Prescriptions Discharge Medication List as of 10/21/2017  2:39 AM       Julianne Rice, MD 10/24/17 1330

## 2017-10-20 NOTE — ED Triage Notes (Signed)
Chest pain

## 2017-10-21 DIAGNOSIS — R0789 Other chest pain: Secondary | ICD-10-CM | POA: Diagnosis not present

## 2017-10-21 LAB — TROPONIN I: Troponin I: <0.03 ng/mL (ref <0.03)

## 2017-10-21 MED ORDER — OMEPRAZOLE 40 MG PO CPDR: DELAYED_RELEASE_CAPSULE | ORAL | 0 refills | Status: DC

## 2017-10-21 NOTE — ED Provider Notes (Signed)
Patient left a change of shift to get results of her delta troponin.  She states her discomfort got tremendously better after getting a GI cocktail.  She had no relief after getting nitroglycerin.  She does report she was started on prednisone about a week ago for diffuse poison ivy which has now resolved.  She has a history of chronic abdominal problems and is followed by Dr. Oneida Alar, gastroenterologist.  She states she takes omeprazole 40 mg in the morning.  She states she has had gastritis in the past and the pain now feels similar to her gastritis although earlier it did not.  We discussed her delta troponin was negative, I think now the symptoms are consistent with acute gastritis after being on steroids for the past week.  She only has 2 more days of 1 pill each to take and we discussed not taking them anymore.  Results for orders placed or performed during the hospital encounter of 10/20/17  CBC with Differential/Platelet  Result Value Ref Range   WBC 10.4 4.0 - 10.5 K/uL   RBC 4.24 3.87 - 5.11 MIL/uL   Hemoglobin 12.6 12.0 - 15.0 g/dL   HCT 37.9 36.0 - 46.0 %   MCV 89.4 78.0 - 100.0 fL   MCH 29.7 26.0 - 34.0 pg   MCHC 33.2 30.0 - 36.0 g/dL   RDW 13.2 11.5 - 15.5 %   Platelets 266 150 - 400 K/uL   Neutrophils Relative % 66 %   Neutro Abs 6.9 1.7 - 7.7 K/uL   Lymphocytes Relative 23 %   Lymphs Abs 2.4 0.7 - 4.0 K/uL   Monocytes Relative 7 %   Monocytes Absolute 0.7 0.1 - 1.0 K/uL   Eosinophils Relative 3 %   Eosinophils Absolute 0.3 0.0 - 0.7 K/uL   Basophils Relative 1 %   Basophils Absolute 0.1 0.0 - 0.1 K/uL  Comprehensive metabolic panel  Result Value Ref Range   Sodium 142 135 - 145 mmol/L   Potassium 3.9 3.5 - 5.1 mmol/L   Chloride 108 101 - 111 mmol/L   CO2 25 22 - 32 mmol/L   Glucose, Bld 119 (H) 65 - 99 mg/dL   BUN 19 6 - 20 mg/dL   Creatinine, Ser 1.02 (H) 0.44 - 1.00 mg/dL   Calcium 8.9 8.9 - 10.3 mg/dL   Total Protein 6.7 6.5 - 8.1 g/dL   Albumin 3.8 3.5 - 5.0 g/dL    AST 18 15 - 41 U/L   ALT 17 14 - 54 U/L   Alkaline Phosphatase 75 38 - 126 U/L   Total Bilirubin 0.2 (L) 0.3 - 1.2 mg/dL   GFR calc non Af Amer 54 (L) >60 mL/min   GFR calc Af Amer >60 >60 mL/min   Anion gap 9 5 - 15  Lipase, blood  Result Value Ref Range   Lipase 45 11 - 51 U/L  Troponin I  Result Value Ref Range   Troponin I <0.03 <0.03 ng/mL  D-dimer, quantitative (not at [facility])  Result Value Ref Range   D-Dimer, Quant 0.50 0.00 - 0.50 ug/mL-FEU  Troponin I  Result Value Ref Range   Troponin I <0.03 <0.03 ng/mL   Diagnoses that have been ruled out:  None  Diagnoses that are still under consideration:  None  Final diagnoses:  Atypical chest pain  Upper abdominal pain  Acute gastritis without hemorrhage, unspecified gastritis type   Plan discharge  Rolland Porter, MD, [place]onia Small, Daleen Bo,  MD 10/21/17 [id number]

## 2017-11-06 ENCOUNTER — Ambulatory Visit: Payer: Medicare Other | Admitting: Gastroenterology

## 2017-11-06 ENCOUNTER — Encounter: Payer: Self-pay | Admitting: Gastroenterology

## 2017-11-06 ENCOUNTER — Ambulatory Visit (INDEPENDENT_AMBULATORY_CARE_PROVIDER_SITE_OTHER): Payer: Medicare Other | Admitting: Gastroenterology

## 2017-11-06 DIAGNOSIS — R1013 Epigastric pain: Secondary | ICD-10-CM | POA: Diagnosis not present

## 2017-11-06 MED ORDER — GI COCKTAIL ~~LOC~~: 30.0000 mL | Freq: Two times a day (BID) | ORAL | 1 refills | Status: DC

## 2017-11-06 NOTE — Progress Notes (Signed)
cc'ed to pcp °

## 2017-11-06 NOTE — Assessment & Plan Note (Signed)
SYMPTOMS NOT IDEALLY CONTROLLED WHILEON PREDNISONE BUT SYMPTOMS NOW CONTROLLED/RESOLVED.  USE GI COCKTAIL 2 TSP EVERY 4 HOURS IF NEEDED TO CONTROL HEARTBURN, CHEST PAIN, OR UPPER ABDOMINAL PAIN. NO MORE THAN 8 DOSES IN A DAY. CONTINUE OMEPRAZOLE.  TAKE 30 MINUTES PRIOR TO YOUR FIRST MEAL.  FOLLOW UP IN 6 MOS.

## 2017-11-06 NOTE — Patient Instructions (Addendum)
USE GI COCKTAIL 2 TSP EVERY 4 HOURS IF NEEDED TO CONTROL HEARTBURN, CHEST PAIN, OR UPPER ABDOMINAL PAIN. NO MORE THAN 8 DOSES IN A DAY.  CONTINUE OMEPRAZOLE.  TAKE 30 MINUTES PRIOR TO YOUR FIRST MEAL.   FOLLOW UP IN 6 MOS.

## 2017-11-06 NOTE — Progress Notes (Signed)
Subjective:    Patient ID: Megan Ingram, female    DOB: Mar 22, 1946, 71 y.o.   MRN: [id number]  [name], NP   HPI In general her intestines are better. Not having tiny stools all day long anymore. WENT TO ED FOR CHEST PAIN UNDER HER LEFT BREAST AND FELT WEAK AND JITTERY. GOT A GI COCKTAIL AND FELT INSTANT RELIEF. WAS TAKING PREDNISONE FOR POISON [place]. NOT FEELING LIKE ACID REFLUX. PAIN EVENTUALLY WENT AWAY.     PT DENIES FEVER, CHILLS, HEMATOCHEZIA, HEMATEMESIS, nausea, vomiting, melena, diarrhea, SHORTNESS OF BREATH,  CHANGE IN BOWEL IN HABITS, constipation, abdominal pain, problems swallowing, OR heartburn or indigestion.  Past Medical History:  Diagnosis Date  . Chest pain   . Complication of anesthesia   . Degenerative joint disease   . Depression   . Endometriosis   . Gastroesophageal reflux disease   . Genital herpes   . Migraines    Ceased at menopause  . Osteopenia   . PONV (postoperative nausea and vomiting)    Past Surgical History:  Procedure Laterality Date  . ABDOMINAL HYSTERECTOMY     Single ovary remained in place.  . ABDOMINAL SURGERY    . BIOPSY N/A 04/26/2014   Procedure: GASTRIC BIOPSY;  Surgeon: Danie Binder, MD;  Location: [facility];  Service: Endoscopy;  Laterality: N/A;  . BUNIONECTOMY  2007   Bilateral  . CATARACT EXTRACTION  2012   bilateral; with lens implant  . [place]  . COLONOSCOPY  2000   incomplete due to pain  . COLONOSCOPY WITH PROPOFOL N/A 04/26/2014   Procedure: COLONOSCOPY WITH PROPOFOL;  Surgeon: Danie Binder, MD;  Location: [facility];  Service: Endoscopy;  Laterality: N/A;  in cecum at 0810 out at 0839 = 29 total minutes  . ESOPHAGOGASTRODUODENOSCOPY (EGD) WITH PROPOFOL N/A 04/26/2014   Procedure: ESOPHAGOGASTRODUODENOSCOPY (EGD) WITH PROPOFOL;  Surgeon: Danie Binder, MD;  Location: [facility];  Service: Endoscopy;  Laterality: N/A;  . EXPLORATORY LAPAROTOMY     ovarian ectopic pregnancy  . POLYPECTOMY N/A  04/26/2014   Procedure: POLYPECTOMY;  Surgeon: Danie Binder, MD;  Location: [facility];  Service: Endoscopy;  Laterality: N/A;  . ROTATOR CUFF REPAIR  2013   Left  . SALPINGOOPHORECTOMY  1971  . SAVORY DILATION N/A 04/26/2014   Procedure: SAVORY DILATION;  Surgeon: Danie Binder, MD;  Location: [facility];  Service: Endoscopy;  Laterality: N/A;  14/15/16  . SEPTOPLASTY  03/23/2016    Allergies  Allergen Reactions  . Bee Venom Anaphylaxis  . Fentanyl And Related Hives  . Versed [Midazolam] Hives    Current Outpatient Medications  Medication Sig Dispense Refill  . acyclovir (ZOVIRAX) 400 MG tablet Take 400 mg by mouth daily.    Marland Kitchen aspirin EC 325 MG tablet Take 2 tablets by mouth daily as needed for mild pain or moderate pain.     . Cholecalciferol (HM VITAMIN D3) 4000 units CAPS Take 3 (three) times a week by mouth.    . citalopram (CELEXA) 40 MG tablet Take 40 mg at bedtime by mouth.     . clonazePAM (KLONOPIN) 0.5 MG tablet Take 1 tablet by mouth daily as needed for anxiety.     . dicyclomine (BENTYL) 10 MG capsule  Take 10 mg daily by mouth    . HYDROcodone-acetaminophen (NORCO/VICODIN) 5-325 MG tablet Take 1 tablet by mouth every 6 (six) hours as needed for moderate pain.    Marland Kitchen omeprazole (PRILOSEC) 40  MG capsule Take 1 po BID x 2 weeks then once a day (Patient taking differently: Take 40 mg daily by mouth. Take 1 po BID x 2 weeks then once a day)    . simvastatin (ZOCOR) 20 MG tablet Take 20 mg by mouth every other day.     . traZODone (DESYREL) 50 MG tablet Take 50 mg by mouth at bedtime.     . TURMERIC PO Take by mouth. Takes 5 times weekly    .       Review of Systems PER HPI OTHERWISE ALL SYSTEMS ARE NEGATIVE.    Objective:   Physical Exam  Constitutional: She is oriented to person, place, and time. She appears well-developed and well-nourished. No distress.  HENT:  Head: Normocephalic and atraumatic.  Mouth/Throat: Oropharynx is clear and moist. No oropharyngeal exudate.  Eyes:  Pupils are equal, round, and reactive to light. No scleral icterus.  Neck: Normal range of motion. Neck supple.  Cardiovascular: Normal rate, regular rhythm and normal heart sounds.  Pulmonary/Chest: Effort normal and breath sounds normal. No respiratory distress.  Abdominal: Soft. Bowel sounds are normal. She exhibits no distension. There is no tenderness.  Musculoskeletal: She exhibits no edema.  Lymphadenopathy:    She has no cervical adenopathy.  Neurological: She is alert and oriented to person, place, and time.  NO FOCAL DEFICITS  Psychiatric: She has a normal mood and affect.  Vitals reviewed.     Assessment & Plan:

## 2017-11-07 NOTE — Progress Notes (Signed)
ON RECALL  °

## 2018-03-10 ENCOUNTER — Other Ambulatory Visit: Payer: Self-pay | Admitting: Gastroenterology

## 2018-03-23 ENCOUNTER — Encounter: Payer: Self-pay | Admitting: Gastroenterology

## 2018-05-05 ENCOUNTER — Encounter (HOSPITAL_COMMUNITY): Payer: Self-pay | Admitting: *Deleted

## 2018-05-05 ENCOUNTER — Emergency Department (HOSPITAL_COMMUNITY): Payer: Medicare Other

## 2018-05-05 ENCOUNTER — Emergency Department (HOSPITAL_COMMUNITY)
Admission: EM | Admit: 2018-05-05 | Discharge: 2018-05-05 | Disposition: A | Discharge status: Home / Self Care | Payer: Medicare Other | Attending: Emergency Medicine | Admitting: Emergency Medicine

## 2018-05-05 ENCOUNTER — Other Ambulatory Visit: Payer: Self-pay

## 2018-05-05 DIAGNOSIS — Z87891 Personal history of nicotine dependence: Secondary | ICD-10-CM | POA: Diagnosis not present

## 2018-05-05 DIAGNOSIS — R1032 Left lower quadrant pain: Secondary | ICD-10-CM | POA: Diagnosis present

## 2018-05-05 DIAGNOSIS — M545 Low back pain, unspecified: Secondary | ICD-10-CM

## 2018-05-05 DIAGNOSIS — Z79899 Other long term (current) drug therapy: Secondary | ICD-10-CM | POA: Insufficient documentation

## 2018-05-05 MED ORDER — HYDROMORPHONE HCL 1 MG/ML IJ SOLN
1.0000 mg | Freq: Once | INTRAMUSCULAR | Status: AC
  Administered 2018-05-05: 1 mg via INTRAMUSCULAR
  Filled 2018-05-05: qty 1

## 2018-05-05 NOTE — ED Provider Notes (Signed)
[facility] EMERGENCY DEPARTMENT Provider Note   CSN: [id number] Arrival date & time: 05/05/18  1522     History   Chief Complaint Chief Complaint  Patient presents with  . Back Pain    HPI Megan Ingram is a 72 y.o. female.  Patient awoke this morning with pain in her left lower back.  No anterior abdominal pain.  No radicular pain.  No obvious injury.  She took a Vicodin at home with minimal relief.  Position and palpation make pain worse.  Severity is moderate.     Past Medical History:  Diagnosis Date  . Chest pain   . Complication of anesthesia   . Degenerative joint disease   . Depression   . Endometriosis   . Gastroesophageal reflux disease   . Genital herpes   . Migraines    Ceased at menopause  . Osteopenia   . PONV (postoperative nausea and vomiting)     Patient Active Problem List   Diagnosis Date Noted  . Abdominal pain, epigastric 08/07/2016  . Achilles tendonitis, bilateral 12/06/2015  . Dyspepsia 10/12/2015  . Colon cancer screening 03/23/2014  . Herniated disc 09/23/2013  . Achilles bursitis or tendinitis 09/23/2013  . Back pain 04/28/2013  . Facet joint disease of lumbosacral region 04/28/2013  . Sciatica neuralgia 04/28/2013  . History of diagnostic tests 01/29/2013  . Chest pain   . Gastroesophageal reflux disease     Past Surgical History:  Procedure Laterality Date  . ABDOMINAL HYSTERECTOMY     Single ovary remained in place.  . ABDOMINAL SURGERY    . BIOPSY N/A 04/26/2014   Procedure: GASTRIC BIOPSY;  Surgeon: Danie Binder, MD;  Location: [facility];  Service: Endoscopy;  Laterality: N/A;  . BUNIONECTOMY  2007   Bilateral  . CATARACT EXTRACTION  2012   bilateral; with lens implant  . [place]  . COLONOSCOPY  2000   incomplete due to pain  . COLONOSCOPY WITH PROPOFOL N/A 04/26/2014   Procedure: COLONOSCOPY WITH PROPOFOL;  Surgeon: Danie Binder, MD;  Location: [facility];  Service: Endoscopy;  Laterality: N/A;  in  cecum at 0810 out at 0839 = 29 total minutes  . ESOPHAGOGASTRODUODENOSCOPY (EGD) WITH PROPOFOL N/A 04/26/2014   Procedure: ESOPHAGOGASTRODUODENOSCOPY (EGD) WITH PROPOFOL;  Surgeon: Danie Binder, MD;  Location: [facility];  Service: Endoscopy;  Laterality: N/A;  . EXPLORATORY LAPAROTOMY     ovarian ectopic pregnancy  . POLYPECTOMY N/A 04/26/2014   Procedure: POLYPECTOMY;  Surgeon: Danie Binder, MD;  Location: [facility];  Service: Endoscopy;  Laterality: N/A;  . ROTATOR CUFF REPAIR  2013   Left  . SALPINGOOPHORECTOMY  1971  . SAVORY DILATION N/A 04/26/2014   Procedure: SAVORY DILATION;  Surgeon: Danie Binder, MD;  Location: [facility];  Service: Endoscopy;  Laterality: N/A;  14/15/16  . SEPTOPLASTY  03/23/2016     OB History   None      Home Medications    Prior to Admission medications   Medication Sig Start Date End Date Taking? Authorizing Provider  acyclovir (ZOVIRAX) 400 MG tablet Take 400 mg by mouth daily.    [provider]  Alum & Mag Hydroxide-Simeth (GI COCKTAIL) SUSP suspension Take 30 mLs 2 (two) times daily by mouth. Shake well. 11/06/17   Danie Binder, MD  aspirin EC 325 MG tablet Take 2 tablets by mouth daily as needed for mild pain or moderate pain.     [provider]  Cholecalciferol (HM VITAMIN D3) 4000 units CAPS Take 3 (three) times a week by mouth.    [provider]  citalopram (CELEXA) 40 MG tablet Take 40 mg at bedtime by mouth.     [provider]  clonazePAM (KLONOPIN) 0.5 MG tablet Take 1 tablet by mouth daily as needed for anxiety.  08/13/11   [provider]  dicyclomine (BENTYL) 10 MG capsule 1 PO 30 MINUTES PRIOR TO BREAKFAST AND LUNCH TO PREVENT ABDOMINAL PAIN OR DIARRHEA. Patient taking differently: Take 10 mg daily by mouth.  04/10/17   Fields, Marga Melnick, MD  HYDROcodone-acetaminophen (NORCO/VICODIN) 5-325 MG tablet Take 1 tablet by mouth every 6 (six) hours as needed for moderate pain.    [provider]    omeprazole (PRILOSEC) 20 MG capsule TAKE 1 CAPSULE 30 MINUTES PRIOR TO MEALS TWICE DAILY. 03/10/18   Carlis Stable, NP  omeprazole (PRILOSEC) 40 MG capsule Take 1 po BID x 2 weeks then once a day Patient taking differently: Take 40 mg daily by mouth. Take 1 po BID x 2 weeks then once a day 10/21/17   Rolland Porter, MD  predniSONE (DELTASONE) 20 MG tablet Take 20 mg by mouth daily.  10/14/17   [provider]  simvastatin (ZOCOR) 20 MG tablet Take 20 mg by mouth every other day.     [provider]  traZODone (DESYREL) 50 MG tablet Take 50 mg by mouth at bedtime.  01/31/17   [provider]  TURMERIC PO Take by mouth. Takes 5 times weekly    [provider]    Family History Family History  Problem Relation Age of Onset  . COPD Mother   . Breast cancer Mother   . Valvular heart disease Mother        aortic valve replacement  . Macular degeneration Mother   . Parkinson's disease Father   . Stroke Paternal Grandfather   . Colon cancer Sister 57    Social History Social History   Tobacco Use  . Smoking status: Former Smoker    Packs/day: 0.50    Years: 2.00    Pack years: 1.00    Types: Cigarettes    Start date: 01/27/1977    Last attempt to quit: 01/27/1979    Years since quitting: 39.2  . Smokeless tobacco: Never Used  Substance Use Topics  . Alcohol use: Yes    Alcohol/week: 0.0 oz    Comment: Occasional, 1-2 glasses of wine a month  . Drug use: No     Allergies   Bee venom; Fentanyl and related; and Versed [midazolam]   Review of Systems Review of Systems  All other systems reviewed and are negative.    Physical Exam Updated Vital Signs BP (!) 110/53 (BP Location: Right Arm)   Pulse 83   Temp 98 F (36.7 C) (Oral)   Resp 12   Wt 64 kg (141 lb)   SpO2 96%   BMI 25.38 kg/m   Physical Exam  Constitutional: She is oriented to person, place, and time. She appears well-developed and well-nourished.  HENT:  Head: Normocephalic  and atraumatic.  Eyes: Conjunctivae are normal.  Neck: Neck supple.  Cardiovascular: Normal rate.  Pulmonary/Chest: Effort normal.  Musculoskeletal:  Tender in the superior left pelvis area  Neurological: She is alert and oriented to person, place, and time.  Skin: Skin is warm and dry.  Psychiatric: She has a normal mood and affect. Her behavior is normal.  Nursing note and  vitals reviewed.    ED Treatments / Results  Labs (all labs ordered are listed, but only abnormal results are displayed) Labs Reviewed - No data to display  EKG None  Radiology Dg Pelvis 1-2 Views  Result Date: 05/05/2018 CLINICAL DATA:  Left sacroiliac pain EXAM: PELVIS - 1-2 VIEW COMPARISON:  CT abdomen pelvis 01/28/2014 FINDINGS: Negative for fracture or mass. Normal SI joints bilaterally. No erosion or ankylosis. Surgical clip in the right pelvis. IMPRESSION: Negative. Electronically Signed   By: Franchot Gallo M.D.   On: 05/05/2018 17:07    Procedures Procedures (including critical care time)  Medications Ordered in ED Medications  HYDROmorphone (DILAUDID) injection 1 mg (1 mg Intramuscular Given 05/05/18 1635)     Initial Impression / Assessment and Plan / ED Course  I have reviewed the triage vital signs and the nursing notes.  Pertinent labs & imaging results that were available during my care of the patient were reviewed by me and considered in my medical decision making (see chart for details).     Patient presents with pain in her left posterior pelvic bone.  X-ray shows no bony pathology.  Dilaudid 1 mg IM administered.  Discussed findings with the patient and her significant other.  Will treat conservatively with Aleve, ice, back brace  Final Clinical Impressions(s) / ED Diagnoses   Final diagnoses:  Acute left-sided low back pain without sciatica    ED Discharge Orders    None       Nat Christen, MD 05/05/18 1800

## 2018-05-05 NOTE — ED Triage Notes (Signed)
Pt states she woke up this morning with a "crick" in her back and pt state the pain has gotten progressively worse over the course of the day; pt denies any obvious injury; pt states she took a vicodin with no relief

## 2018-05-05 NOTE — Discharge Instructions (Signed)
X-ray showed no acute bony injury.  Rest, ice, support brace, Aleve, Tylenol.  Follow-up with orthopedics if not improving

## 2018-05-26 ENCOUNTER — Other Ambulatory Visit: Payer: Self-pay

## 2018-06-15 NOTE — Progress Notes (Signed)
06/16/2018 2:55 PM   Megan Ingram 10/03/1946 [id number]  Referring provider: [name], NP [place], [place] [address]  Chief Complaint  Patient presents with  . Recurrent UTI    New Patient    HPI: Patient is a 72 -year-old Caucasian female who is referred to [place] by [name], NP for recurrent urinary tract infections.  She is a former nephrology nurse.    Patient states her symptoms that started 5 weeks ago.  Reviewing her records,  she has had no documented UTI's.    Her symptoms with a urinary tract infection consist of frequency and urgency.  Not symptomatic at this time.   She denies dysuria, gross hematuria, suprapubic pain, back pain, abdominal pain or flank pain associated with UTI's.  She has not had any recent fevers, chills, nausea or vomiting associated with UTI's.   She does not have a history of nephrolithiasis, GU surgery or GU trauma.   She is postmenopausal.   She denies constipation and/or diarrhea.   She does engage in good perineal hygiene.  She has urge incontinence.    She is not drinking a lot of water daily.  Her UA today is negative.  Her PVR is 36 mL.    PMH: Past Medical History:  Diagnosis Date  . Chest pain   . Complication of anesthesia   . Degenerative joint disease   . Depression   . Endometriosis   . Gastroesophageal reflux disease   . Genital herpes   . Migraines    Ceased at menopause  . Osteopenia   . PONV (postoperative nausea and vomiting)     Surgical History: Past Surgical History:  Procedure Laterality Date  . ABDOMINAL HYSTERECTOMY     Single ovary remained in place.  . ABDOMINAL SURGERY    . BIOPSY N/A 04/26/2014   Procedure: GASTRIC BIOPSY;  Surgeon: Danie Binder, MD;  Location: [facility];  Service: Endoscopy;  Laterality: N/A;  . BUNIONECTOMY  2007   Bilateral  . CATARACT EXTRACTION  2012   bilateral; with lens implant  . [place]  . COLONOSCOPY  2000   incomplete  due to pain  . COLONOSCOPY WITH PROPOFOL N/A 04/26/2014   Procedure: COLONOSCOPY WITH PROPOFOL;  Surgeon: Danie Binder, MD;  Location: [facility];  Service: Endoscopy;  Laterality: N/A;  in cecum at 0810 out at 0839 = 29 total minutes  . ESOPHAGOGASTRODUODENOSCOPY (EGD) WITH PROPOFOL N/A 04/26/2014   Procedure: ESOPHAGOGASTRODUODENOSCOPY (EGD) WITH PROPOFOL;  Surgeon: Danie Binder, MD;  Location: [facility];  Service: Endoscopy;  Laterality: N/A;  . EXPLORATORY LAPAROTOMY     ovarian ectopic pregnancy  . POLYPECTOMY N/A 04/26/2014   Procedure: POLYPECTOMY;  Surgeon: Danie Binder, MD;  Location: [facility];  Service: Endoscopy;  Laterality: N/A;  . ROTATOR CUFF REPAIR  2013   Left  . SALPINGOOPHORECTOMY  1971  . SAVORY DILATION N/A 04/26/2014   Procedure: SAVORY DILATION;  Surgeon: Danie Binder, MD;  Location: [facility];  Service: Endoscopy;  Laterality: N/A;  14/15/16  . SEPTOPLASTY  03/23/2016    Home Medications:  Allergies as of 06/16/2018      Reactions   Bee Venom Anaphylaxis   Fentanyl And Related Hives   Versed [midazolam] Hives      Medication List        Accurate as of 06/16/18  2:55 PM. Always use your most recent med list.  acyclovir 400 MG tablet Commonly known as:  ZOVIRAX Take 400 mg by mouth daily.   aspirin EC 325 MG tablet Take 2 tablets by mouth daily as needed for mild pain or moderate pain.   citalopram 40 MG tablet Commonly known as:  CELEXA Take 40 mg at bedtime by mouth.   clonazePAM 0.5 MG tablet Commonly known as:  KLONOPIN Take 1 tablet by mouth daily as needed for anxiety.   dicyclomine 10 MG capsule Commonly known as:  BENTYL 1 PO 30 MINUTES PRIOR TO BREAKFAST AND LUNCH TO PREVENT ABDOMINAL PAIN OR DIARRHEA.   estradiol 0.1 MG/GM vaginal cream Commonly known as:  ESTRACE VAGINAL Apply 0.5mg  (pea-sized amount)  just inside the vaginal introitus with a finger-tip on Monday, Wednesday and Friday nights.   gi cocktail Susp suspension Take 30 mLs  2 (two) times daily by mouth. Shake well.   HM VITAMIN D3 4000 units Caps Generic drug:  Cholecalciferol Take 3 (three) times a week by mouth.   omeprazole 20 MG capsule Commonly known as:  PRILOSEC TAKE 1 CAPSULE 30 MINUTES PRIOR TO MEALS TWICE DAILY.   predniSONE 20 MG tablet Commonly known as:  DELTASONE Take 20 mg by mouth daily.   simvastatin 20 MG tablet Commonly known as:  ZOCOR Take 20 mg by mouth every other day.   traZODone 50 MG tablet Commonly known as:  DESYREL Take 50 mg by mouth at bedtime.   TURMERIC PO Take by mouth. Takes 5 times weekly       Allergies:  Allergies  Allergen Reactions  . Bee Venom Anaphylaxis  . Fentanyl And Related Hives  . Versed [Midazolam] Hives    Family History: Family History  Problem Relation Age of Onset  . COPD Mother   . Breast cancer Mother   . Valvular heart disease Mother        aortic valve replacement  . Macular degeneration Mother   . Parkinson's disease Father   . Stroke Paternal Grandfather   . Colon cancer Sister 68    Social History:  reports that she quit smoking about 39 years ago. Her smoking use included cigarettes. She started smoking about 41 years ago. She has a 1.00 pack-year smoking history. She has never used smokeless tobacco. She reports that she drinks alcohol. She reports that she does not use drugs.  ROS: UROLOGY Frequent Urination?: Yes Hard to postpone urination?: Yes Burning/pain with urination?: No Get up at night to urinate?: No Leakage of urine?: Yes Urine stream starts and stops?: No Trouble starting stream?: No Do you have to strain to urinate?: No Blood in urine?: Yes Urinary tract infection?: No Sexually transmitted disease?: No Injury to kidneys or bladder?: No Painful intercourse?: No Weak stream?: No Currently pregnant?: No Vaginal bleeding?: No Last menstrual period?: n  Gastrointestinal Nausea?: No Vomiting?: No Indigestion/heartburn?: No Diarrhea?:  No Constipation?: No  Constitutional Fever: No Night sweats?: No Weight loss?: No Fatigue?: No  Skin Skin rash/lesions?: Yes Itching?: Yes  Eyes Blurred vision?: No Double vision?: No  Ears/Nose/Throat Sore throat?: No Sinus problems?: No  Hematologic/Lymphatic Swollen glands?: No Easy bruising?: No  Cardiovascular Leg swelling?: No Chest pain?: No  Respiratory Cough?: No Shortness of breath?: No  Endocrine Excessive thirst?: No  Musculoskeletal Back pain?: Yes Joint pain?: Yes  Neurological Headaches?: Yes Dizziness?: No  Psychologic Depression?: No Anxiety?: No  Physical Exam: BP 97/62   Pulse 85   Ht 5\' 3"  (1.6 m)   Wt 140 lb (63.5  kg)   BMI 24.80 kg/m   Constitutional:  Well nourished. Alert and oriented, No acute distress. HEENT: [place] AT, moist mucus membranes.  Trachea midline, no masses. Cardiovascular: No clubbing, cyanosis, or edema. Respiratory: Normal respiratory effort, no increased work of breathing. GI: Abdomen is soft, non tender, non distended, no abdominal masses. Liver and spleen not palpable.  No hernias appreciated.  Stool sample for occult testing is not indicated.   GU: No CVA tenderness.  No bladder fullness or masses.  Atrophic external genitalia, normal pubic hair distribution, no lesions.  Normal urethral meatus, no lesions, no prolapse, no discharge.   No urethral masses, tenderness and/or tenderness. No bladder fullness, tenderness or masses. Pale vagina mucosa, good estrogen effect, no discharge, no lesions, good pelvic support, grade I  Cystocele.  No rectocele noted.  Cervix, uterus and adnexa are surgically absent.  Anus and perineum are without rashes or lesions.   Skin: No rashes, bruises or suspicious lesions. Lymph: No cervical or inguinal adenopathy. Neurologic: Grossly intact, no focal deficits, moving all 4 extremities. Psychiatric: Normal mood and affect.  Laboratory Data: Lab Results  Component Value Date   WBC  10.4 10/20/2017   HGB 12.6 10/20/2017   HCT 37.9 10/20/2017   MCV 89.4 10/20/2017   PLT 266 10/20/2017    Lab Results  Component Value Date   CREATININE 1.02 (H) 10/20/2017    No results found for: PSA  No results found for: TESTOSTERONE  No results found for: HGBA1C  No results found for: TSH  No results found for: CHOL, HDL, CHOLHDL, VLDL, LDLCALC  Lab Results  Component Value Date   AST 18 10/20/2017   Lab Results  Component Value Date   ALT 17 10/20/2017   No components found for: ALKALINEPHOPHATASE No components found for: BILIRUBINTOTAL  No results found for: ESTRADIOL  Urinalysis See HPI and Epic.   I have reviewed the labs.   Pertinent Imaging: Results for DERYL, PORTS (MRN [id number]) as of 06/16/2018 20:17  Ref. Range 06/16/2018 14:06  Scan Result Unknown 33ml    Assessment & Plan:    1. Urge incontinence Discussed behavioral therapies, bladder training and bladder control strategies Deferred pelvic floor muscle training as she just completed PT for other issues Encouraged more water intake Not interested in medical therapy at this time  2. Vaginal atrophy She has been on vaginal estrogen cream in the past and is willing to restart the cream at this time Given Premarin cream sample and script sent for Estrace cream send to pharmacy  RTC in one month for recheck                                    Return in about 1 month (around 07/16/2018) for recheck .  These notes generated with voice recognition software. I apologize for typographical errors.  Zara Council, PA-C  [facility] [address]  [place],  [address] [phone]

## 2018-06-16 ENCOUNTER — Encounter: Payer: Self-pay | Admitting: Urology

## 2018-06-16 ENCOUNTER — Ambulatory Visit (INDEPENDENT_AMBULATORY_CARE_PROVIDER_SITE_OTHER): Payer: Medicare Other | Admitting: Urology

## 2018-06-16 VITALS — BP 97/62 | HR 85 | Ht 63.0 in | Wt 140.0 lb

## 2018-06-16 DIAGNOSIS — R3915 Urgency of urination: Secondary | ICD-10-CM

## 2018-06-16 DIAGNOSIS — N3941 Urge incontinence: Secondary | ICD-10-CM

## 2018-06-16 DIAGNOSIS — N952 Postmenopausal atrophic vaginitis: Secondary | ICD-10-CM

## 2018-06-16 LAB — MICROSCOPIC EXAMINATION

## 2018-06-16 LAB — BLADDER SCAN AMB NON-IMAGING

## 2018-06-16 LAB — URINALYSIS, COMPLETE
Bilirubin, UA: NEGATIVE
Glucose, UA: NEGATIVE
KETONES UA: NEGATIVE
Nitrite, UA: NEGATIVE
Protein, UA: NEGATIVE
Urobilinogen, Ur: 0.2 mg/dL (ref 0.2–1.0)
pH, UA: 5 (ref 5.0–7.5)

## 2018-06-16 MED ORDER — ESTRADIOL 0.1 MG/GM VA CREA: TOPICAL_CREAM | VAGINAL | 12 refills | Status: AC

## 2018-07-08 ENCOUNTER — Ambulatory Visit (INDEPENDENT_AMBULATORY_CARE_PROVIDER_SITE_OTHER): Payer: Medicare Other | Admitting: Gastroenterology

## 2018-07-08 ENCOUNTER — Encounter

## 2018-07-08 ENCOUNTER — Encounter: Payer: Self-pay | Admitting: Gastroenterology

## 2018-07-08 DIAGNOSIS — R1013 Epigastric pain: Secondary | ICD-10-CM | POA: Diagnosis not present

## 2018-07-08 DIAGNOSIS — Z1211 Encounter for screening for malignant neoplasm of colon: Secondary | ICD-10-CM

## 2018-07-08 NOTE — Assessment & Plan Note (Signed)
PERSONAL OF POLYPS AND FAMILY HISTORY OF COLON CANCER IN FIRST DEGREE RELATIVE. NO BRBPR OR MELENA.   NEXT TCS IN MAY 2020-MAY 2022.

## 2018-07-08 NOTE — Progress Notes (Signed)
Subjective:    Patient ID: Megan Ingram, female    DOB: 1946-01-05, 72 y.o.   MRN: [id number]  [name], NP  HPI Lost 17 lbs over the the winter. USING APP NOOM. FEELS BETTER. DOING YOGA STRETCHES WITH PT. NOT USING DICYCLOMINE. NOOM FIXED HER GUT. BMs: RARE CONSTIPATION. NO DIARRHEA(#4-6). ALWAYS HAS DISCOMFORT IN LOWER ABDOMEN ESPECIALLY ON THE RIGHT. GETS WORSE INTERMITTENTLY RLQ.  PT DENIES FEVER, CHILLS, HEMATOCHEZIA, HEMATEMESIS, nausea, vomiting, melena, diarrhea, CHEST PAIN, SHORTNESS OF BREATH,  CHANGE IN BOWEL IN HABITS, abdominal pain, problems swallowing, OR heartburn or indigestion.  Past Medical History:  Diagnosis Date  . Chest pain   . Complication of anesthesia   . Degenerative joint disease   . Depression   . Endometriosis   . Gastroesophageal reflux disease   . Genital herpes   . Migraines    Ceased at menopause  . Osteopenia   . PONV (postoperative nausea and vomiting)     Past Surgical History:  Procedure Laterality Date  . ABDOMINAL HYSTERECTOMY     Single ovary remained in place.  . ABDOMINAL SURGERY    . BIOPSY N/A 04/26/2014   Procedure: GASTRIC BIOPSY;  Surgeon: Danie Binder, MD;  Location: [facility];  Service: Endoscopy;  Laterality: N/A;  . BUNIONECTOMY  2007   Bilateral  . CATARACT EXTRACTION  2012   bilateral; with lens implant  . [place]  . COLONOSCOPY  2000   incomplete due to pain  . COLONOSCOPY WITH PROPOFOL N/A 04/26/2014   Procedure: COLONOSCOPY WITH PROPOFOL;  Surgeon: Danie Binder, MD;  Location: [facility];  Service: Endoscopy;  Laterality: N/A;  in cecum at 0810 out at 0839 = 29 total minutes  . ESOPHAGOGASTRODUODENOSCOPY (EGD) WITH PROPOFOL N/A 04/26/2014   Procedure: ESOPHAGOGASTRODUODENOSCOPY (EGD) WITH PROPOFOL;  Surgeon: Danie Binder, MD;  Location: [facility];  Service: Endoscopy;  Laterality: N/A;  . EXPLORATORY LAPAROTOMY     ovarian ectopic pregnancy  . POLYPECTOMY N/A 04/26/2014   Procedure:  POLYPECTOMY;  Surgeon: Danie Binder, MD;  Location: [facility];  Service: Endoscopy;  Laterality: N/A;  . ROTATOR CUFF REPAIR  2013   Left  . SALPINGOOPHORECTOMY  1971  . SAVORY DILATION N/A 04/26/2014   Procedure: SAVORY DILATION;  Surgeon: Danie Binder, MD;  Location: [facility];  Service: Endoscopy;  Laterality: N/A;  14/15/16  . SEPTOPLASTY  03/23/2016    Allergies  Allergen Reactions  . Bee Venom Anaphylaxis  . Fentanyl And Related Hives  . Versed [Midazolam] Hives    Current Outpatient Medications  Medication Sig    . acyclovir (ZOVIRAX) 400 MG tablet Take 400 mg by mouth daily.    . Alum & Mag Hydroxide-Simeth (GI COCKTAIL) SUSP suspension Take 30 mLs 2 (two) times daily by mouth. Shake well.    . aspirin EC 325 MG tablet Take 2 tablets by mouth daily as needed for mild pain or moderate pain.     . Cholecalciferol (HM VITAMIN D3) 4000 units CAPS Take 3 (three) times a week by mouth.    . citalopram (CELEXA) 40 MG tablet Take 40 mg at bedtime by mouth.     . clonazePAM (KLONOPIN) 0.5 MG tablet Take 1 tablet by mouth daily as needed for anxiety.     . dicyclomine (BENTYL) 10 MG capsule 1 PO 30 MINUTES PRIOR TO BREAKFAST AND LUNCH TO PREVENT ABDOMINAL PAIN OR DIARRHEA.     Marland Kitchen estradiol (ESTRACE VAGINAL) 0.1 MG/GM  vaginal cream Apply 0.5mg  (pea-sized amount)  just inside the vaginal introitus with a finger-tip on Monday, Wednesday and Friday nights.    Marland Kitchen omeprazole (PRILOSEC) 20 MG capsule TAKE 1 CAPSULE 30 MINUTES PRIOR TO MEALS TWICE DAILY.    Marland Kitchen predniSONE (DELTASONE) 20 MG tablet Take 20 mg by mouth daily.     . simvastatin (ZOCOR) 20 MG tablet Take 20 mg by mouth every other day.     . traZODone (DESYREL) 50 MG tablet Take 50 mg by mouth at bedtime.     . TURMERIC PO Take by mouth. Takes 5 times weekly     Review of Systems PER HPI OTHERWISE ALL SYSTEMS ARE NEGATIVE.    Objective:   Physical Exam  Constitutional: She is oriented to person, place, and time. She appears  well-developed and well-nourished. No distress.  HENT:  Head: Normocephalic and atraumatic.  Mouth/Throat: Oropharynx is clear and moist. No oropharyngeal exudate.  Eyes: Pupils are equal, round, and reactive to light. No scleral icterus.  Neck: Normal range of motion. Neck supple.  Cardiovascular: Normal rate, regular rhythm and normal heart sounds.  Pulmonary/Chest: Effort normal and breath sounds normal. No respiratory distress.  Abdominal: Soft. Bowel sounds are normal. She exhibits no distension. There is no tenderness.  Musculoskeletal: She exhibits no edema.  Lymphadenopathy:    She has no cervical adenopathy.  Neurological: She is alert and oriented to person, place, and time.  Psychiatric: She has a normal mood and affect.  Vitals reviewed.     Assessment & Plan:

## 2018-07-08 NOTE — Patient Instructions (Signed)
CONTINUE HEALTHY FOOD CHOICES IT'S WORKING.  CONTINUE OMEPRAZOLE.  TAKE 30 MINUTES PRIOR TO YOUR FIRST MEAL.   FOLLOW UP IN 6 MOS.

## 2018-07-08 NOTE — Assessment & Plan Note (Signed)
SYMPTOMS FAIRLY WELL CONTROLLED.  CONTINUE HEALTHY FOOD CHOICES IT'S WORKING. CONTINUE OMEPRAZOLE.  TAKE 30 MINUTES PRIOR TO YOUR FIRST MEAL.   FOLLOW UP IN 6 MOS.

## 2018-07-14 NOTE — Progress Notes (Signed)
07/15/2018 2:51 PM   Megan Ingram 1946-07-01 [id number]  Referring provider: [name], NP [facility] [address] [place], [place] [address]  No chief complaint on file.   HPI: Patient is a 72 year old Caucasian female with recurrent UTI's and vaginal atrophy who presents today for a one month follow up.  Background history Patient is a 7 -year-old Caucasian female who is referred to [place] by [name], NP for recurrent urinary tract infections.  She is a former nephrology nurse.  Patient states her symptoms that started 5 weeks ago.  Reviewing her records,  she has had no documented UTI's.  Her symptoms with a urinary tract infection consist of frequency and urgency.  Not symptomatic at this time.  She denies dysuria, gross hematuria, suprapubic pain, back pain, abdominal pain or flank pain associated with UTI's.  She has not had any recent fevers, chills, nausea or vomiting associated with UTI's.  She does not have a history of nephrolithiasis, GU surgery or GU trauma.  She is postmenopausal.  She denies constipation and/or diarrhea.  She does engage in good perineal hygiene.  She has urge incontinence.  She is not drinking a lot of water daily.  Her UA today is negative.  Her PVR is 36 mL.    Today, she is experiencing frequency and urgency.  Patient denies any gross hematuria, dysuria or suprapubic/flank pain.  Patient denies any fevers, chills, nausea or vomiting.   PMH: Past Medical History:  Diagnosis Date  . Chest pain   . Complication of anesthesia   . Degenerative joint disease   . Depression   . Endometriosis   . Gastroesophageal reflux disease   . Genital herpes   . Migraines    Ceased at menopause  . Osteopenia   . PONV (postoperative nausea and vomiting)     Surgical History: Past Surgical History:  Procedure Laterality Date  . ABDOMINAL HYSTERECTOMY     Single ovary remained in place.  . ABDOMINAL SURGERY    . BIOPSY N/A 04/26/2014   Procedure: GASTRIC  BIOPSY;  Surgeon: Danie Binder, MD;  Location: [facility];  Service: Endoscopy;  Laterality: N/A;  . BUNIONECTOMY  2007   Bilateral  . CATARACT EXTRACTION  2012   bilateral; with lens implant  . [place]  . COLONOSCOPY  2000   incomplete due to pain  . COLONOSCOPY WITH PROPOFOL N/A 04/26/2014   Procedure: COLONOSCOPY WITH PROPOFOL;  Surgeon: Danie Binder, MD;  Location: [facility];  Service: Endoscopy;  Laterality: N/A;  in cecum at 0810 out at 0839 = 29 total minutes  . ESOPHAGOGASTRODUODENOSCOPY (EGD) WITH PROPOFOL N/A 04/26/2014   Procedure: ESOPHAGOGASTRODUODENOSCOPY (EGD) WITH PROPOFOL;  Surgeon: Danie Binder, MD;  Location: [facility];  Service: Endoscopy;  Laterality: N/A;  . EXPLORATORY LAPAROTOMY     ovarian ectopic pregnancy  . POLYPECTOMY N/A 04/26/2014   Procedure: POLYPECTOMY;  Surgeon: Danie Binder, MD;  Location: [facility];  Service: Endoscopy;  Laterality: N/A;  . ROTATOR CUFF REPAIR  2013   Left  . SALPINGOOPHORECTOMY  1971  . SAVORY DILATION N/A 04/26/2014   Procedure: SAVORY DILATION;  Surgeon: Danie Binder, MD;  Location: [facility];  Service: Endoscopy;  Laterality: N/A;  14/15/16  . SEPTOPLASTY  03/23/2016    Home Medications:  Allergies as of 07/15/2018      Reactions   Bee Venom Anaphylaxis   Fentanyl And Related Hives   Versed [midazolam] Hives  Medication List        Accurate as of 07/15/18  2:51 PM. Always use your most recent med list.          acyclovir 400 MG tablet Commonly known as:  ZOVIRAX Take 400 mg by mouth daily.   citalopram 40 MG tablet Commonly known as:  CELEXA Take 40 mg at bedtime by mouth.   clonazePAM 0.5 MG tablet Commonly known as:  KLONOPIN Take 1 tablet by mouth daily as needed for anxiety.   estradiol 0.1 MG/GM vaginal cream Commonly known as:  ESTRACE VAGINAL Apply 0.5mg  (pea-sized amount)  just inside the vaginal introitus with a finger-tip on Monday, Wednesday and Friday nights.   HM VITAMIN D3 4000 units  Caps Generic drug:  Cholecalciferol Take 3 (three) times a week by mouth.   omeprazole 20 MG capsule Commonly known as:  PRILOSEC TAKE 1 CAPSULE 30 MINUTES PRIOR TO MEALS TWICE DAILY.   RESTASIS 0.05 % ophthalmic emulsion Generic drug:  cycloSPORINE Place 1 drop into both eyes daily.   simvastatin 20 MG tablet Commonly known as:  ZOCOR Take 20 mg by mouth every other day.   traZODone 50 MG tablet Commonly known as:  DESYREL Take 50 mg by mouth at bedtime.   TURMERIC PO Take by mouth. Takes 5 times weekly       Allergies:  Allergies  Allergen Reactions  . Bee Venom Anaphylaxis  . Fentanyl And Related Hives  . Versed [Midazolam] Hives    Family History: Family History  Problem Relation Age of Onset  . COPD Mother   . Breast cancer Mother   . Valvular heart disease Mother        aortic valve replacement  . Macular degeneration Mother   . Parkinson's disease Father   . Stroke Paternal Grandfather   . Colon cancer Sister 70    Social History:  reports that she quit smoking about 39 years ago. Her smoking use included cigarettes. She started smoking about 41 years ago. She has a 1.00 pack-year smoking history. She has never used smokeless tobacco. She reports that she drinks alcohol. She reports that she does not use drugs.  ROS: UROLOGY Frequent Urination?: Yes Hard to postpone urination?: Yes Burning/pain with urination?: No Get up at night to urinate?: No Leakage of urine?: No Urine stream starts and stops?: No Trouble starting stream?: No Do you have to strain to urinate?: No Blood in urine?: No Urinary tract infection?: No Sexually transmitted disease?: No Injury to kidneys or bladder?: No Painful intercourse?: No Weak stream?: No Currently pregnant?: No Vaginal bleeding?: No Last menstrual period?: n  Gastrointestinal Nausea?: No Vomiting?: No Indigestion/heartburn?: No Diarrhea?: No Constipation?: No  Constitutional Fever: No Night  sweats?: No Weight loss?: No Fatigue?: No  Skin Skin rash/lesions?: No Itching?: No  Eyes Blurred vision?: No Double vision?: No  Ears/Nose/Throat Sore throat?: No Sinus problems?: No  Hematologic/Lymphatic Swollen glands?: No Easy bruising?: No  Cardiovascular Leg swelling?: No Chest pain?: No  Respiratory Cough?: No Shortness of breath?: No  Endocrine Excessive thirst?: No  Musculoskeletal Back pain?: No Joint pain?: No  Neurological Headaches?: No Dizziness?: No  Psychologic Depression?: No Anxiety?: No  Physical Exam: BP 120/77 (BP Location: Left Arm, Patient Position: Sitting, Cuff Size: Normal)   Pulse 69   Ht 5\' 3"  (1.6 m)   Wt 147 lb (66.7 kg)   BMI 26.04 kg/m   Constitutional: Well nourished. Alert and oriented, No acute distress. HEENT: [place] AT, moist  mucus membranes. Trachea midline, no masses. Cardiovascular: No clubbing, cyanosis, or edema. Respiratory: Normal respiratory effort, no increased work of breathing. Skin: No rashes, bruises or suspicious lesions. Lymph: No cervical or inguinal adenopathy. Neurologic: Grossly intact, no focal deficits, moving all 4 extremities. Psychiatric: Normal mood and affect.   Laboratory Data: Lab Results  Component Value Date   WBC 10.4 10/20/2017   HGB 12.6 10/20/2017   HCT 37.9 10/20/2017   MCV 89.4 10/20/2017   PLT 266 10/20/2017    Lab Results  Component Value Date   CREATININE 1.02 (H) 10/20/2017    No results found for: PSA  No results found for: TESTOSTERONE  No results found for: HGBA1C  No results found for: TSH  No results found for: CHOL, HDL, CHOLHDL, VLDL, LDLCALC  Lab Results  Component Value Date   AST 18 10/20/2017   Lab Results  Component Value Date   ALT 17 10/20/2017   No components found for: ALKALINEPHOPHATASE No components found for: BILIRUBINTOTAL  No results found for: ESTRADIOL  I have reviewed the labs.    Assessment & Plan:    1. Urge  incontinence Discussed behavioral therapies, bladder training and bladder control strategies Deferred pelvic floor muscle training as she just completed PT for other issues Encouraged more water intake Interested in Myrbertriq 25 mg daily at this time, I have advised the patient of the side effects of Myrbetriq, such as: elevation in BP, urinary retention and/or HA. RTC in 3 weeks for PVR and OAB questionnaire.   2. Vaginal atrophy She has been on vaginal estrogen cream in the past and is willing to restart the cream at this time Given Premarin cream sample and script sent for Estrace cream send to pharmacy  RTC for recheck in 3 weeks                                  Return in about 3 weeks (around 08/05/2018) for OAB questionnaire, PVR and exam.  These notes generated with voice recognition software. I apologize for typographical errors.  Zara Council, PA-C  [facility] [address]  [place], [place] [address] [phone]

## 2018-07-15 ENCOUNTER — Ambulatory Visit (INDEPENDENT_AMBULATORY_CARE_PROVIDER_SITE_OTHER): Payer: Medicare Other | Admitting: Urology

## 2018-07-15 ENCOUNTER — Encounter: Payer: Self-pay | Admitting: Urology

## 2018-07-15 VITALS — BP 120/77 | HR 69 | Ht 63.0 in | Wt 147.0 lb

## 2018-07-15 DIAGNOSIS — N952 Postmenopausal atrophic vaginitis: Secondary | ICD-10-CM

## 2018-07-15 DIAGNOSIS — N3941 Urge incontinence: Secondary | ICD-10-CM | POA: Diagnosis not present

## 2018-08-05 NOTE — Progress Notes (Signed)
08/06/2018 2:57 PM   Megan Ingram 11-Jul-1946 [id number]  Referring provider: [name], NP [facility] [place], [place] [address]  Chief Complaint  Patient presents with  . Over Active Bladder    HPI: Patient is a 72 year old Caucasian female with recurrent UTI's and vaginal atrophy who presents today for a three week follow up.    Background history Patient is a 84 -year-old Caucasian female who is referred to [place] by [name], NP for recurrent urinary tract infections.  She is a former nephrology nurse.  Patient states her symptoms that started 5 weeks ago.  Reviewing her records,  she has had no documented UTI's.  Her symptoms with a urinary tract infection consist of frequency and urgency.  Not symptomatic at this time.  She denies dysuria, gross hematuria, suprapubic pain, back pain, abdominal pain or flank pain associated with UTI's.  She has not had any recent fevers, chills, nausea or vomiting associated with UTI's.  She does not have a history of nephrolithiasis, GU surgery or GU trauma.  She is postmenopausal.  She denies constipation and/or diarrhea.  She does engage in good perineal hygiene.  She has urge incontinence.  She is not drinking a lot of water daily.  Her UA today is negative.  Her PVR is 36 mL.    Today, she is experiencing frequency and urgency.  Patient denies any gross hematuria, dysuria or suprapubic/flank pain.  Patient denies any fevers, chills, nausea or vomiting.  Her PVR is 12 mL.  Her BP is 120/66.   She has found the Myrbetriq 25 mg daily has given her a 75% improvement in her urinary symptoms.  She is worried about the cost at this time.  She is still experiencing a "twinge" at the end of her urethra.  She is using the vaginal estrogen cream three nights weekly.    PMH: Past Medical History:  Diagnosis Date  . Chest pain   . Complication of anesthesia   . Degenerative joint disease   . Depression   . Endometriosis   .  Gastroesophageal reflux disease   . Genital herpes   . Migraines    Ceased at menopause  . Osteopenia   . PONV (postoperative nausea and vomiting)     Surgical History: Past Surgical History:  Procedure Laterality Date  . ABDOMINAL HYSTERECTOMY     Single ovary remained in place.  . ABDOMINAL SURGERY    . BIOPSY N/A 04/26/2014   Procedure: GASTRIC BIOPSY;  Surgeon: Danie Binder, MD;  Location: [facility];  Service: Endoscopy;  Laterality: N/A;  . BUNIONECTOMY  2007   Bilateral  . CATARACT EXTRACTION  2012   bilateral; with lens implant  . [place]  . COLONOSCOPY  2000   incomplete due to pain  . COLONOSCOPY WITH PROPOFOL N/A 04/26/2014   Procedure: COLONOSCOPY WITH PROPOFOL;  Surgeon: Danie Binder, MD;  Location: [facility];  Service: Endoscopy;  Laterality: N/A;  in cecum at 0810 out at 0839 = 29 total minutes  . ESOPHAGOGASTRODUODENOSCOPY (EGD) WITH PROPOFOL N/A 04/26/2014   Procedure: ESOPHAGOGASTRODUODENOSCOPY (EGD) WITH PROPOFOL;  Surgeon: Danie Binder, MD;  Location: [facility];  Service: Endoscopy;  Laterality: N/A;  . EXPLORATORY LAPAROTOMY     ovarian ectopic pregnancy  . POLYPECTOMY N/A 04/26/2014   Procedure: POLYPECTOMY;  Surgeon: Danie Binder, MD;  Location: [facility];  Service: Endoscopy;  Laterality: N/A;  . ROTATOR CUFF REPAIR  2013   Left  . SALPINGOOPHORECTOMY  1971  . SAVORY DILATION N/A 04/26/2014   Procedure: SAVORY DILATION;  Surgeon: Danie Binder, MD;  Location: [facility];  Service: Endoscopy;  Laterality: N/A;  14/15/16  . SEPTOPLASTY  03/23/2016    Home Medications:  Allergies as of 08/06/2018      Reactions   Bee Venom Anaphylaxis   Fentanyl And Related Hives   Versed [midazolam] Hives      Medication List        Accurate as of 08/06/18  2:57 PM. Always use your most recent med list.          acyclovir 400 MG tablet Commonly known as:  ZOVIRAX Take 400 mg by mouth daily.   citalopram 40 MG tablet Commonly known as:  CELEXA Take 40 mg at  bedtime by mouth.   clonazePAM 0.5 MG tablet Commonly known as:  KLONOPIN Take 1 tablet by mouth daily as needed for anxiety.   estradiol 0.1 MG/GM vaginal cream Commonly known as:  ESTRACE Apply 0.5mg  (pea-sized amount)  just inside the vaginal introitus with a finger-tip on Monday, Wednesday and Friday nights.   HM VITAMIN D3 4000 units Caps Generic drug:  Cholecalciferol Take 3 (three) times a week by mouth.   mirabegron ER 25 MG Tb24 tablet Commonly known as:  MYRBETRIQ Take 1 tablet (25 mg total) by mouth daily.   omeprazole 20 MG capsule Commonly known as:  PRILOSEC TAKE 1 CAPSULE 30 MINUTES PRIOR TO MEALS TWICE DAILY.   RESTASIS 0.05 % ophthalmic emulsion Generic drug:  cycloSPORINE Place 1 drop into both eyes daily.   simvastatin 20 MG tablet Commonly known as:  ZOCOR Take 20 mg by mouth every other day.   traZODone 50 MG tablet Commonly known as:  DESYREL Take 50 mg by mouth at bedtime.   TURMERIC PO Take by mouth. Takes 5 times weekly       Allergies:  Allergies  Allergen Reactions  . Bee Venom Anaphylaxis  . Fentanyl And Related Hives  . Versed [Midazolam] Hives    Family History: Family History  Problem Relation Age of Onset  . COPD Mother   . Breast cancer Mother   . Valvular heart disease Mother        aortic valve replacement  . Macular degeneration Mother   . Parkinson's disease Father   . Stroke Paternal Grandfather   . Colon cancer Sister 94    Social History:  reports that she quit smoking about 39 years ago. Her smoking use included cigarettes. She started smoking about 41 years ago. She has a 1.00 pack-year smoking history. She has never used smokeless tobacco. She reports that she drinks alcohol. She reports that she does not use drugs.  ROS: UROLOGY Frequent Urination?: Yes Hard to postpone urination?: Yes Burning/pain with urination?: No Get up at night to urinate?: No Leakage of urine?: No Urine stream starts and stops?:  No Trouble starting stream?: No Do you have to strain to urinate?: No Blood in urine?: No Urinary tract infection?: No Sexually transmitted disease?: No Injury to kidneys or bladder?: No Painful intercourse?: No Weak stream?: No Currently pregnant?: No Vaginal bleeding?: No Last menstrual period?: Hysterectomy  Gastrointestinal Nausea?: No Vomiting?: No Indigestion/heartburn?: No Diarrhea?: No Constipation?: No  Constitutional Fever: No Night sweats?: No Weight loss?: No Fatigue?: No  Skin Skin rash/lesions?: No Itching?: No  Eyes Blurred vision?: No Double vision?: No  Ears/Nose/Throat Sore throat?: No Sinus problems?: No  Hematologic/Lymphatic Swollen glands?: No Easy bruising?: No  Cardiovascular Leg swelling?: No Chest pain?: No  Respiratory Cough?: No Shortness of breath?: No  Endocrine Excessive thirst?: No  Musculoskeletal Back pain?: No Joint pain?: No  Neurological Headaches?: Yes Dizziness?: No  Psychologic Depression?: No Anxiety?: No  Physical Exam: BP 120/66 (BP Location: Left Arm, Patient Position: Sitting, Cuff Size: Normal)   Pulse 70   Ht 5\' 3"  (1.6 m)   Wt 147 lb 3.2 oz (66.8 kg)   BMI 26.08 kg/m   Constitutional: Well nourished. Alert and oriented, No acute distress. HEENT: [place] AT, moist mucus membranes. Trachea midline, no masses. Cardiovascular: No clubbing, cyanosis, or edema. Respiratory: Normal respiratory effort, no increased work of breathing. Skin: No rashes, bruises or suspicious lesions. Lymph: No cervical or inguinal adenopathy. Neurologic: Grossly intact, no focal deficits, moving all 4 extremities. Psychiatric: Normal mood and affect.   Laboratory Data: Lab Results  Component Value Date   WBC 10.4 10/20/2017   HGB 12.6 10/20/2017   HCT 37.9 10/20/2017   MCV 89.4 10/20/2017   PLT 266 10/20/2017    Lab Results  Component Value Date   CREATININE 1.02 (H) 10/20/2017    No results found for:  PSA  No results found for: TESTOSTERONE  No results found for: HGBA1C  No results found for: TSH  No results found for: CHOL, HDL, CHOLHDL, VLDL, LDLCALC  Lab Results  Component Value Date   AST 18 10/20/2017   Lab Results  Component Value Date   ALT 17 10/20/2017   No components found for: ALKALINEPHOPHATASE No components found for: BILIRUBINTOTAL  No results found for: ESTRADIOL  I have reviewed the labs.  Pertinent Imaging Results for MARDIE, KELLEN (MRN [id number]) as of 08/06/2018 14:45  Ref. Range 08/06/2018 14:44  Scan Result Unknown 35mL    Assessment & Plan:    1. Urge incontinence Discussed behavioral therapies, bladder training and bladder control strategies Deferred pelvic floor muscle training as she just completed PT for other issues Encouraged more water intake Continue Myrbertriq 25 mg daily at this time; I have sent in a script  Discussed the role of cystoscopy in further evaluating her symptoms, but she would like to hold on this for now - she would prefer that Dr. Erlene Quan perform the cystoscopy as she would like a female provider RTC in 3 months for PVR and OAB questionnaire.   2. Vaginal atrophy Continue the vaginal estrogen cream RTC for recheck in 3 months                                   Return in about 3 months (around 11/06/2018) for OAB questionnaire, PVR and exam.  These notes generated with voice recognition software. I apologize for typographical errors.  Zara Council, PA-C  [facility] [address]  [place], [place] [address] [phone]

## 2018-08-06 ENCOUNTER — Ambulatory Visit (INDEPENDENT_AMBULATORY_CARE_PROVIDER_SITE_OTHER): Payer: Medicare Other | Admitting: Urology

## 2018-08-06 ENCOUNTER — Encounter: Payer: Self-pay | Admitting: Urology

## 2018-08-06 VITALS — BP 120/66 | HR 70 | Ht 63.0 in | Wt 147.2 lb

## 2018-08-06 DIAGNOSIS — N3941 Urge incontinence: Secondary | ICD-10-CM | POA: Diagnosis not present

## 2018-08-06 DIAGNOSIS — N952 Postmenopausal atrophic vaginitis: Secondary | ICD-10-CM | POA: Diagnosis not present

## 2018-08-06 LAB — BLADDER SCAN AMB NON-IMAGING

## 2018-08-06 MED ORDER — MIRABEGRON ER 25 MG PO TB24: 25.0000 mg | ORAL_TABLET | Freq: Every day | ORAL | 12 refills | Status: DC

## 2018-09-06 ENCOUNTER — Encounter (HOSPITAL_COMMUNITY): Payer: Self-pay

## 2018-09-06 ENCOUNTER — Other Ambulatory Visit: Payer: Self-pay

## 2018-09-06 ENCOUNTER — Emergency Department (HOSPITAL_COMMUNITY)
Admission: EM | Admit: 2018-09-06 | Discharge: 2018-09-06 | Disposition: A | Discharge status: Home / Self Care | Payer: Medicare Other | Attending: Emergency Medicine | Admitting: Emergency Medicine

## 2018-09-06 DIAGNOSIS — R002 Palpitations: Secondary | ICD-10-CM

## 2018-09-06 DIAGNOSIS — Z87891 Personal history of nicotine dependence: Secondary | ICD-10-CM | POA: Diagnosis not present

## 2018-09-06 DIAGNOSIS — R531 Weakness: Secondary | ICD-10-CM | POA: Insufficient documentation

## 2018-09-06 DIAGNOSIS — Z79899 Other long term (current) drug therapy: Secondary | ICD-10-CM | POA: Diagnosis not present

## 2018-09-06 DIAGNOSIS — R197 Diarrhea, unspecified: Secondary | ICD-10-CM | POA: Diagnosis present

## 2018-09-06 LAB — URINALYSIS, ROUTINE W REFLEX MICROSCOPIC
BILIRUBIN URINE: NEGATIVE
Bacteria, UA: NONE SEEN
GLUCOSE, UA: NEGATIVE mg/dL
KETONES UR: NEGATIVE mg/dL
LEUKOCYTES UA: NEGATIVE
Nitrite: NEGATIVE
PROTEIN: NEGATIVE mg/dL
Specific Gravity, Urine: 1.004 — ABNORMAL LOW (ref 1.005–1.030)
pH: 6 (ref 5.0–8.0)

## 2018-09-06 LAB — COMPREHENSIVE METABOLIC PANEL
ALBUMIN: 3.9 g/dL (ref 3.5–5.0)
ALK PHOS: 56 U/L (ref 38–126)
ALT: 12 U/L (ref 0–44)
AST: 22 U/L (ref 15–41)
Anion gap: 8 (ref 5–15)
BILIRUBIN TOTAL: 0.5 mg/dL (ref 0.3–1.2)
BUN: 12 mg/dL (ref 8–23)
CO2: 25 mmol/L (ref 22–32)
Calcium: 8.8 mg/dL — ABNORMAL LOW (ref 8.9–10.3)
Chloride: 108 mmol/L (ref 98–111)
Creatinine, Ser: 0.83 mg/dL (ref 0.44–1.00)
GFR calc Af Amer: >60 mL/min (ref >60)
GLUCOSE: 119 mg/dL — AB (ref 70–99)
Potassium: 3.6 mmol/L (ref 3.5–5.1)
Sodium: 141 mmol/L (ref 135–145)
Total Protein: 6.7 g/dL (ref 6.5–8.1)

## 2018-09-06 LAB — CBC WITH DIFFERENTIAL/PLATELET
BASOS PCT: 1 %
Basophils Absolute: 0.1 10*3/uL (ref 0.0–0.1)
Eosinophils Absolute: 0.1 10*3/uL (ref 0.0–0.7)
Eosinophils Relative: 3 %
HEMATOCRIT: 35.3 % — AB (ref 36.0–46.0)
HEMOGLOBIN: 12.1 g/dL (ref 12.0–15.0)
LYMPHS ABS: 1.7 10*3/uL (ref 0.7–4.0)
LYMPHS PCT: 34 %
MCH: 30.5 pg (ref 26.0–34.0)
MCHC: 34.3 g/dL (ref 30.0–36.0)
MCV: 88.9 fL (ref 78.0–100.0)
MONOS PCT: 9 %
Monocytes Absolute: 0.4 10*3/uL (ref 0.1–1.0)
NEUTROS ABS: 2.6 10*3/uL (ref 1.7–7.7)
NEUTROS PCT: 53 %
Platelets: 239 10*3/uL (ref 150–400)
RBC: 3.97 MIL/uL (ref 3.87–5.11)
RDW: 12.6 % (ref 11.5–15.5)
WBC: 5 10*3/uL (ref 4.0–10.5)

## 2018-09-06 LAB — MAGNESIUM: MAGNESIUM: 2.2 mg/dL (ref 1.7–2.4)

## 2018-09-06 LAB — TROPONIN I: Troponin I: <0.03 ng/mL (ref <0.03)

## 2018-09-06 MED ORDER — LACTATED RINGERS IV BOLUS
1000.0000 mL | Freq: Once | INTRAVENOUS | Status: AC
  Administered 2018-09-06: 1000 mL via INTRAVENOUS

## 2018-09-06 NOTE — ED Notes (Signed)
Pt ambulated very well. Pt did not feel weak or dizzy.

## 2018-09-06 NOTE — ED Provider Notes (Signed)
Emergency Department Provider Note   I have reviewed the triage vital signs and the nursing notes.   HISTORY  Chief Complaint Diarrhea and Weakness   HPI Megan Ingram is a 72 y.o. female without significant past medical history the presents to the emergency department today secondary to weakness and palpitations.  Patient states that she had few episodes of diarrhea last night that were nonbloody nonbilious and then had difficulty sleeping throughout the night secondary to noisy neighbors.  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  She described that she would have to normal pulses followed by a "clunk" repeatedly.  This would transition to an irregularly irregular rhythm.  So she was able to put on her clothes and walked downstairs without difficulty she called EMS when she got down there and set and laid on the couch waiting for them to come.  When they came she was walked to the truck apparently without difficulty.  ECGs from them reviewed show no abnormalities.  Apparently normal vital signs with them.  Presents here for further evaluation.  During this whole episode she had no significant chest pain just a mild pressure at some point.  She had no back pain abdominal pain headaches or vision changes.  These episodes.  No other recent illnesses.  No urinary symptoms.  She states that she had a diarrhea last night she drank a bit of water to try to stay ahead of dehydration. No other associated or modifying symptoms.    Past Medical History:  Diagnosis Date  . Chest pain   . Complication of anesthesia   . Degenerative joint disease   . Depression   . Endometriosis   . Gastroesophageal reflux disease   . Genital  herpes   . Migraines    Ceased at menopause  . Osteopenia   . PONV (postoperative nausea and vomiting)     Patient Active Problem List   Diagnosis Date Noted  . Abdominal pain, epigastric 08/07/2016  . Achilles tendonitis, bilateral 12/06/2015  . Dyspepsia 10/12/2015  . Colon cancer screening 03/23/2014  . Herniated disc 09/23/2013  . Achilles bursitis or tendinitis 09/23/2013  . Back pain 04/28/2013  . Facet joint disease of lumbosacral region 04/28/2013  . Sciatica neuralgia 04/28/2013  . History of diagnostic tests 01/29/2013  . Chest pain   . Gastroesophageal reflux disease     Past Surgical History:  Procedure Laterality Date  . ABDOMINAL HYSTERECTOMY     Single ovary remained in place.  . ABDOMINAL SURGERY    . BIOPSY N/A 04/26/2014   Procedure: GASTRIC BIOPSY;  Surgeon: Danie Binder, MD;  Location: [facility];  Service: Endoscopy;  Laterality: N/A;  . BUNIONECTOMY  2007   Bilateral  . CATARACT EXTRACTION  2012   bilateral; with lens implant  . [place]  . COLONOSCOPY  2000   incomplete due to pain  . COLONOSCOPY WITH PROPOFOL N/A 04/26/2014   Procedure: COLONOSCOPY WITH PROPOFOL;  Surgeon: Danie Binder, MD;  Location: [facility];  Service: Endoscopy;  Laterality: N/A;  in cecum at 0810 out at 0839 = 29 total minutes  . ESOPHAGOGASTRODUODENOSCOPY (EGD) WITH  PROPOFOL N/A 04/26/2014   Procedure: ESOPHAGOGASTRODUODENOSCOPY (EGD) WITH PROPOFOL;  Surgeon: Danie Binder, MD;  Location: [facility];  Service: Endoscopy;  Laterality: N/A;  . EXPLORATORY LAPAROTOMY     ovarian ectopic pregnancy  . POLYPECTOMY N/A 04/26/2014   Procedure: POLYPECTOMY;  Surgeon: Danie Binder, MD;  Location: [facility];  Service: Endoscopy;  Laterality: N/A;  . ROTATOR CUFF REPAIR  2013   Left  . SALPINGOOPHORECTOMY  1971  . SAVORY DILATION N/A 04/26/2014   Procedure: SAVORY DILATION;  Surgeon: Danie Binder, MD;  Location: [facility];  Service: Endoscopy;  Laterality: N/A;  14/15/16  . SEPTOPLASTY   03/23/2016    Current Outpatient Rx  . Order #: [id number] Class: Historical Med  . Order #: [id number] Class: Historical Med  . Order #: [id number] Class: Historical Med  . Order #: [id number] Class: Historical Med  . Order #: [id number] Class: Normal  . Order #: [id number] Class: Historical Med  . Order #: [id number] Class: Normal  . Order #: [id number] Class: Historical Med  . Order #: [id number] Class: Normal  . Order #: [id number] Class: Historical Med  . Order #: [id number] Class: Historical Med  . Order #: [id number] Class: Historical Med  . Order #: [id number] Class: Historical Med    Allergies Bee venom; Fentanyl and related; and Versed [midazolam]  Family History  Problem Relation Age of Onset  . COPD Mother   . Breast cancer Mother   . Valvular heart disease Mother        aortic valve replacement  . Macular degeneration Mother   . Parkinson's disease Father   . Stroke Paternal Grandfather   . Colon cancer Sister 71    Social History Social History   Tobacco Use  . Smoking status: Former Smoker    Packs/day: 0.50    Years: 2.00    Pack years: 1.00    Types: Cigarettes    Start date: 01/27/1977    Last attempt to quit: 01/27/1979    Years since quitting: 39.6  . Smokeless tobacco: Never Used  Substance Use Topics  . Alcohol use: Yes    Comment: Occasional, 1-2 glasses of wine a month  . Drug use: No    Review of Systems  All other systems negative except as documented in the HPI. All pertinent positives and negatives as reviewed in the HPI. ____________________________________________   PHYSICAL EXAM:  VITAL SIGNS: Blood pressure (!) 155/83, pulse 75, temperature 98.1 F (36.7 C), temperature source Oral, resp. rate 16, height 5\' 2"  (1.575 m), weight 66.7 kg, SpO2 98 %.  Constitutional: Alert and oriented. Well appearing and in no acute distress. Eyes: Conjunctivae are normal. PERRL. EOMI. Head: Atraumatic. Nose: No congestion/rhinnorhea. Mouth/Throat: Mucous membranes  are moist.  Oropharynx non-erythematous. Neck: No stridor.  No meningeal signs.   Cardiovascular: Normal rate, regular rhythm. Good peripheral circulation. Grossly normal heart sounds.   Respiratory: Normal respiratory effort.  No retractions. Lungs CTAB. Gastrointestinal: Soft and nontender. No distention.  Musculoskeletal: No lower extremity tenderness nor edema. No gross deformities of extremities. Neurologic:  Normal speech and language. No gross focal neurologic deficits are appreciated.  Skin:  Skin is warm, dry and intact. No rash noted.   ____________________________________________   LABS (all labs ordered are listed, but only abnormal results are displayed)  Labs Reviewed  CBC WITH DIFFERENTIAL/PLATELET - Abnormal; Notable for the following components:      Result Value   HCT 35.3 (*)    All other components within normal limits  COMPREHENSIVE METABOLIC PANEL - Abnormal; Notable for the  following components:   Glucose, Bld 119 (*)    Calcium 8.8 (*)    All other components within normal limits  URINALYSIS, ROUTINE W REFLEX MICROSCOPIC - Abnormal; Notable for the following components:   Color, Urine STRAW (*)    Specific Gravity, Urine 1.004 (*)    Hgb urine dipstick SMALL (*)    All other components within normal limits  MAGNESIUM  TROPONIN I   ____________________________________________  EKG   EKG Interpretation  Date/Time:    Ventricular Rate:    PR Interval:    QRS Duration:   QT Interval:    QTC Calculation:   R Axis:     Text Interpretation:         ____________________________________________  RADIOLOGY  No results found.  ____________________________________________   PROCEDURES  Procedure(s) performed:   Procedures   ____________________________________________   INITIAL IMPRESSION / ASSESSMENT AND PLAN / ED COURSE  [employer], lightheadedness and weakness possibly related to dehydration versus electrolyte disorder.  Will  evaluate for same.  Fluids.  When I set her up to auscultate her lungs she did have return of lightheadedness although her vital signs stable pretty stable.  She has had some PACs while was in the room but no obvious ventricular tachycardia or other atrial arrhythmias.  Suspect patient likely be able to go home.  Unclear etiology for symptoms but feeling much better after fluids. Possibly related? No electrolyte abnormalities though, so will ask to fu w/ cards for palpitations and irregular heart beat.  Pertinent labs & imaging results that were available during my care of the patient were reviewed by me and considered in my medical decision making (see chart for details).  ____________________________________________  FINAL CLINICAL IMPRESSION(S) / ED DIAGNOSES  Final diagnoses:  Palpitations  Weakness     MEDICATIONS GIVEN DURING THIS VISIT:  Medications  lactated ringers bolus 1,000 mL ( Intravenous Stopped 09/06/18 1804)     NEW OUTPATIENT MEDICATIONS STARTED DURING THIS VISIT:  Discharge Medication List as of 09/06/2018  7:42 PM      Note:  This note was prepared with assistance of Dragon voice recognition software. Occasional wrong-word or sound-a-like substitutions may have occurred due to the inherent limitations of voice recognition software.   Merrily Pew, MD 09/06/18 2108

## 2018-09-06 NOTE — ED Notes (Signed)
Pt understands we need a urine sample. Unable to give sample at this time. Will call when to pt is able to give sample

## 2018-09-06 NOTE — ED Triage Notes (Signed)
Pt reports diarrhea and weakness since yesterday.  Reports today felt like heart was beating irregularly and pounding today.    Pt has history of cardiac arrest years ago due to hypovolemia from ectopic pregnancy.  EMS reports hr 90s, nsr, bp 128/72.  Pt alert and oriented.  PT says feels lightheaded, weakness, and chest feels a little heavy.

## 2018-09-20 ENCOUNTER — Emergency Department (HOSPITAL_COMMUNITY)
Admission: EM | Admit: 2018-09-20 | Discharge: 2018-09-20 | Discharge status: Absent without leave | Payer: Medicare Other

## 2018-09-25 ENCOUNTER — Other Ambulatory Visit: Payer: Self-pay

## 2018-09-25 ENCOUNTER — Emergency Department (HOSPITAL_COMMUNITY)
Admission: EM | Admit: 2018-09-25 | Discharge: 2018-09-26 | Disposition: A | Discharge status: Home / Self Care | Payer: Medicare Other | Attending: Emergency Medicine | Admitting: Emergency Medicine

## 2018-09-25 ENCOUNTER — Encounter (HOSPITAL_COMMUNITY): Payer: Self-pay | Admitting: Emergency Medicine

## 2018-09-25 DIAGNOSIS — R5383 Other fatigue: Secondary | ICD-10-CM | POA: Diagnosis not present

## 2018-09-25 DIAGNOSIS — R002 Palpitations: Secondary | ICD-10-CM | POA: Diagnosis present

## 2018-09-25 DIAGNOSIS — Z87891 Personal history of nicotine dependence: Secondary | ICD-10-CM | POA: Diagnosis not present

## 2018-09-25 DIAGNOSIS — I493 Ventricular premature depolarization: Secondary | ICD-10-CM | POA: Insufficient documentation

## 2018-09-25 DIAGNOSIS — Z79899 Other long term (current) drug therapy: Secondary | ICD-10-CM | POA: Diagnosis not present

## 2018-09-25 DIAGNOSIS — I491 Atrial premature depolarization: Secondary | ICD-10-CM

## 2018-09-25 NOTE — ED Provider Notes (Signed)
[facility] EMERGENCY DEPARTMENT Provider Note   CSN: [id number] Arrival date & time: 09/25/18  2324     History   Chief Complaint Chief Complaint  Patient presents with  . Palpitations    HPI Megan Ingram is a 72 y.o. female.  Patient presents to the emergency department for evaluation of palpitations.  Patient reports that this has been ongoing for weeks.  Symptoms occur intermittently.  She reports that she feels generalized weakness, but otherwise denies chest pain, shortness of breath.  She has not had any recent illness.  She reports that she has been unable to get a cardiology appointment for follow-up yet.     Past Medical History:  Diagnosis Date  . Chest pain   . Complication of anesthesia   . Degenerative joint disease   . Depression   . Endometriosis   . Gastroesophageal reflux disease   . Genital herpes   . Migraines    Ceased at menopause  . Osteopenia   . PONV (postoperative nausea and vomiting)     Patient Active Problem List   Diagnosis Date Noted  . Abdominal pain, epigastric 08/07/2016  . Achilles tendonitis, bilateral 12/06/2015  . Dyspepsia 10/12/2015  . Colon cancer screening 03/23/2014  . Herniated disc 09/23/2013  . Achilles bursitis or tendinitis 09/23/2013  . Back pain 04/28/2013  . Facet joint disease of lumbosacral region 04/28/2013  . Sciatica neuralgia 04/28/2013  . History of diagnostic tests 01/29/2013  . Chest pain   . Gastroesophageal reflux disease     Past Surgical History:  Procedure Laterality Date  . ABDOMINAL HYSTERECTOMY     Single ovary remained in place.  . ABDOMINAL SURGERY    . BIOPSY N/A 04/26/2014   Procedure: GASTRIC BIOPSY;  Surgeon: Danie Binder, MD;  Location: [facility];  Service: Endoscopy;  Laterality: N/A;  . BUNIONECTOMY  2007   Bilateral  . CATARACT EXTRACTION  2012   bilateral; with lens implant  . [employer]  . COLONOSCOPY  2000   incomplete due to pain  . COLONOSCOPY WITH  PROPOFOL N/A 04/26/2014   Procedure: COLONOSCOPY WITH PROPOFOL;  Surgeon: Danie Binder, MD;  Location: [facility];  Service: Endoscopy;  Laterality: N/A;  in cecum at 0810 out at 0839 = 29 total minutes  . ESOPHAGOGASTRODUODENOSCOPY (EGD) WITH PROPOFOL N/A 04/26/2014   Procedure: ESOPHAGOGASTRODUODENOSCOPY (EGD) WITH PROPOFOL;  Surgeon: Danie Binder, MD;  Location: [facility];  Service: Endoscopy;  Laterality: N/A;  . EXPLORATORY LAPAROTOMY     ovarian ectopic pregnancy  . POLYPECTOMY N/A 04/26/2014   Procedure: POLYPECTOMY;  Surgeon: Danie Binder, MD;  Location: [facility];  Service: Endoscopy;  Laterality: N/A;  . ROTATOR CUFF REPAIR  2013   Left  . SALPINGOOPHORECTOMY  1971  . SAVORY DILATION N/A 04/26/2014   Procedure: SAVORY DILATION;  Surgeon: Danie Binder, MD;  Location: [facility];  Service: Endoscopy;  Laterality: N/A;  14/15/16  . SEPTOPLASTY  03/23/2016     OB History   None      Home Medications    Prior to Admission medications   Medication Sig Start Date End Date Taking? Authorizing Provider  acyclovir (ZOVIRAX) 400 MG tablet Take 400 mg by mouth daily.    [provider]  Cholecalciferol (HM VITAMIN D3) 4000 units CAPS Take 1 capsule by mouth every Monday, Wednesday, and Friday.     [provider]  citalopram (CELEXA) 40 MG tablet Take 40 mg at bedtime  by mouth.     [provider]  clonazePAM (KLONOPIN) 0.5 MG tablet Take 1 tablet by mouth daily as needed for anxiety.  08/13/11   [provider]  estradiol (ESTRACE VAGINAL) 0.1 MG/GM vaginal cream Apply 0.5mg  (pea-sized amount)  just inside the vaginal introitus with a finger-tip on Monday, Wednesday and Friday nights. Patient taking differently: Place 1 Applicatorful vaginally 3 (three) times a week. Apply 0.5mg  (pea-sized amount)  just inside the vaginal introitus with a finger-tip on Monday, Wednesday and Friday nights. 06/16/18   Zara Council A, PA-C  HYDROcodone-acetaminophen (NORCO/VICODIN)  5-325 MG tablet Take 1 tablet by mouth every 6 (six) hours as needed for moderate pain.    [provider]  mirabegron ER (MYRBETRIQ) 25 MG TB24 tablet Take 1 tablet (25 mg total) by mouth daily. Patient taking differently: Take 25 mg by mouth 4 (four) times a week. Saturdays, Sundays, Tuesdays and Thursdays 08/06/18   Zara Council A, PA-C  Multiple Vitamin (MULTIVITAMIN WITH MINERALS) TABS tablet Take 1 tablet by mouth daily.    [provider]  omeprazole (PRILOSEC) 20 MG capsule TAKE 1 CAPSULE 30 MINUTES PRIOR TO MEALS TWICE DAILY. Patient taking differently: Take 40 mg by mouth daily. TAKE 1 CAPSULE 30 MINUTES PRIOR TO MEALS TWICE DAILY. 03/10/18   Carlis Stable, NP  RESTASIS 0.05 % ophthalmic emulsion Place 1 drop into both eyes daily. 06/30/18   [provider]  simvastatin (ZOCOR) 20 MG tablet Take 20 mg by mouth 4 (four) times a week. Saturdays, Sundays, Tuesdays and Thursdays    [provider]  traZODone (DESYREL) 50 MG tablet Take 50 mg by mouth at bedtime.  01/31/17   [provider]  TURMERIC PO Take 1 tablet by mouth daily.     [provider]    Family History Family History  Problem Relation Age of Onset  . COPD Mother   . Breast cancer Mother   . Valvular heart disease Mother        aortic valve replacement  . Macular degeneration Mother   . Parkinson's disease Father   . Stroke Paternal Grandfather   . Colon cancer Sister 25    Social History Social History   Tobacco Use  . Smoking status: Former Smoker    Packs/day: 0.50    Years: 2.00    Pack years: 1.00    Types: Cigarettes    Start date: 01/27/1977    Last attempt to quit: 01/27/1979    Years since quitting: 39.6  . Smokeless tobacco: Never Used  Substance Use Topics  . Alcohol use: Yes    Comment: Occasional, 1-2 glasses of wine a month  . Drug use: No     Allergies   Bee venom; Fentanyl and related; and Versed [midazolam]   Review of  Systems Review of Systems  Constitutional: Positive for fatigue.  Cardiovascular: Positive for palpitations.  All other systems reviewed and are negative.    Physical Exam Updated Vital Signs BP (!) 142/83 (BP Location: Right Arm)   Pulse 84   Temp 98 F (36.7 C) (Oral)   Resp 16   SpO2 100%   Physical Exam  Constitutional: She is oriented to person, place, and time. She appears well-developed and well-nourished. No distress.  HENT:  Head: Normocephalic and atraumatic.  Right Ear: Hearing normal.  Left Ear: Hearing normal.  Nose: Nose normal.  Mouth/Throat: Oropharynx is clear and moist and mucous membranes are normal.  Eyes: Pupils are equal,  round, and reactive to light. Conjunctivae and EOM are normal.  Neck: Normal range of motion. Neck supple.  Cardiovascular: Regular rhythm, S1 normal and S2 normal.  Occasional extrasystoles are present. Exam reveals no gallop and no friction rub.  No murmur heard. Pulmonary/Chest: Effort normal and breath sounds normal. No respiratory distress. She exhibits no tenderness.  Abdominal: Soft. Normal appearance and bowel sounds are normal. There is no hepatosplenomegaly. There is no tenderness. There is no rebound, no guarding, no tenderness at McBurney's point and negative Murphy's sign. No hernia.  Musculoskeletal: Normal range of motion.  Neurological: She is alert and oriented to person, place, and time. She has normal strength. No cranial nerve deficit or sensory deficit. Coordination normal. GCS eye subscore is 4. GCS verbal subscore is 5. GCS motor subscore is 6.  Skin: Skin is warm, dry and intact. No rash noted. No cyanosis.  Psychiatric: She has a normal mood and affect. Her speech is normal and behavior is normal. Thought content normal.  Nursing note and vitals reviewed.    ED Treatments / Results  Labs (all labs ordered are listed, but only abnormal results are displayed) Labs Reviewed  CBC - Abnormal; Notable for the  following components:      Result Value   RBC 3.79 (*)    Hemoglobin 11.3 (*)    HCT 34.8 (*)    All other components within normal limits  BASIC METABOLIC PANEL - Abnormal; Notable for the following components:   Glucose, Bld 112 (*)    GFR calc non Af Amer 59 (*)    All other components within normal limits  MAGNESIUM    EKG EKG Interpretation  Date/Time:  Friday September 25 2018 23:29:47 EDT Ventricular Rate:  82 PR Interval:    QRS Duration: 97 QT Interval:  378 QTC Calculation: 442 R Axis:   76 Text Interpretation:  Sinus rhythm Atrial premature complexes Confirmed by Orpah Greek [phone]) on 09/26/2018 12:03:57 AM   Radiology No results found.  Procedures Procedures (including critical care time)  Medications Ordered in ED Medications - No data to display   Initial Impression / Assessment and Plan / ED Course  I have reviewed the triage vital signs and the nursing notes.  Pertinent labs & imaging results that were available during my care of the patient were reviewed by me and considered in my medical decision making (see chart for details).     Patient presents to the emergency department for evaluation of palpitations.  She has been experiencing these frequently for the last several weeks.  She reports that symptoms have been intermittent tonight but she has been having periods of severe palpitations.  No associated chest pain.  She is not short of breath.  At arrival to the emergency department she has noted to have frequent PACs, no other arrhythmia noted.  Electrolytes unremarkable.  Patient has cardiology follow-up scheduled in 3 weeks.  She obviously will need outpatient monitoring.  Will offer her low-dose Lopressor to help suppress symptoms.  Final Clinical Impressions(s) / ED Diagnoses   Final diagnoses:  Palpitations  PAC (premature atrial contraction)    ED Discharge Orders    None       Pollina, Gwenyth Allegra, MD 09/26/18 [phone]

## 2018-09-25 NOTE — ED Triage Notes (Signed)
Pt here a few weeks ago with palpitations. States the same today. C/o  gen weakness and intermittent palpitations at present. Nondiaphoretic. Nad. Denies pain/sob/dizziness

## 2018-09-26 DIAGNOSIS — I493 Ventricular premature depolarization: Secondary | ICD-10-CM | POA: Diagnosis not present

## 2018-09-26 LAB — BASIC METABOLIC PANEL
ANION GAP: 7 (ref 5–15)
BUN: 18 mg/dL (ref 8–23)
CO2: 25 mmol/L (ref 22–32)
Calcium: 8.9 mg/dL (ref 8.9–10.3)
Chloride: 109 mmol/L (ref 98–111)
Creatinine, Ser: 0.95 mg/dL (ref 0.44–1.00)
GFR, EST NON AFRICAN AMERICAN: 59 mL/min — AB (ref >60)
GLUCOSE: 112 mg/dL — AB (ref 70–99)
POTASSIUM: 3.9 mmol/L (ref 3.5–5.1)
Sodium: 141 mmol/L (ref 135–145)

## 2018-09-26 LAB — CBC
HEMATOCRIT: 34.8 % — AB (ref 36.0–46.0)
Hemoglobin: 11.3 g/dL — ABNORMAL LOW (ref 12.0–15.0)
MCH: 29.8 pg (ref 26.0–34.0)
MCHC: 32.5 g/dL (ref 30.0–36.0)
MCV: 91.8 fL (ref 78.0–100.0)
Platelets: 222 10*3/uL (ref 150–400)
RBC: 3.79 MIL/uL — AB (ref 3.87–5.11)
RDW: 13.1 % (ref 11.5–15.5)
WBC: 7 10*3/uL (ref 4.0–10.5)

## 2018-09-26 LAB — TSH: TSH: 4.264 u[IU]/mL (ref 0.350–4.500)

## 2018-09-26 LAB — MAGNESIUM: Magnesium: 2.1 mg/dL (ref 1.7–2.4)

## 2018-09-26 MED ORDER — METOPROLOL TARTRATE 25 MG PO TABS
25.0000 mg | ORAL_TABLET | Freq: Once | ORAL | Status: AC
  Administered 2018-09-26: 25 mg via ORAL
  Filled 2018-09-26: qty 1

## 2018-09-26 MED ORDER — METOPROLOL TARTRATE 25 MG PO TABS: 25.0000 mg | ORAL_TABLET | Freq: Two times a day (BID) | ORAL | 2 refills | Status: DC

## 2018-09-26 NOTE — Discharge Instructions (Addendum)
Take Lopressor twice daily to help suppress the symptoms.  Try calling the main cardiology the office on [facility] in [place] to see if they can do your initial visit sooner.

## 2018-09-27 LAB — T4: T4, Total: 4.7 ug/dL (ref 4.5–12.0)

## 2018-09-27 LAB — T3: T3 TOTAL: 86 ng/dL (ref 71–180)

## 2018-09-29 ENCOUNTER — Ambulatory Visit (INDEPENDENT_AMBULATORY_CARE_PROVIDER_SITE_OTHER): Payer: Medicare Other | Admitting: Cardiology

## 2018-09-29 ENCOUNTER — Encounter: Payer: Self-pay | Admitting: Cardiology

## 2018-09-29 VITALS — BP 108/62 | HR 63 | Ht 62.0 in | Wt 150.0 lb

## 2018-09-29 DIAGNOSIS — R002 Palpitations: Secondary | ICD-10-CM

## 2018-09-29 DIAGNOSIS — E782 Mixed hyperlipidemia: Secondary | ICD-10-CM | POA: Diagnosis not present

## 2018-09-29 DIAGNOSIS — R9431 Abnormal electrocardiogram [ECG] [EKG]: Secondary | ICD-10-CM | POA: Diagnosis not present

## 2018-09-29 DIAGNOSIS — I491 Atrial premature depolarization: Secondary | ICD-10-CM | POA: Diagnosis not present

## 2018-09-29 NOTE — Patient Instructions (Signed)
Medication Instructions:  The current medical regimen is effective;  continue present plan and medications.  Testing/Procedures: Your physician has requested that you have an echocardiogram. Echocardiography is a painless test that uses sound waves to create images of your heart. It provides your doctor with information about the size and shape of your heart and how well your heart's chambers and valves are working. This procedure takes approximately one hour. There are no restrictions for this procedure.  Your physician has recommended that you wear an event monitor for 14 days. Event monitors are medical devices that record the heart's electrical activity. Doctors most often [place] these monitors to diagnose arrhythmias. Arrhythmias are problems with the speed or rhythm of the heartbeat. The monitor is a small, portable device. You can wear one while you do your normal daily activities. This is usually used to diagnose what is causing palpitations/syncope (passing out).  Follow-Up: Follow up will be based on the results of the above testing.  Thank you for choosing [facility]!!

## 2018-09-29 NOTE — Progress Notes (Signed)
Cardiology Office Note:    Date:  09/29/2018   ID:  Megan Ingram, DOB 11-02-1946, MRN [id number]  PCP:  [name], NP  Cardiologist:  No primary care provider on file.  Electrophysiologist:  None   Referring MD: [name], NP     History of Present Illness:    Megan Ingram is a 72 y.o. female here for evaluation of palpitations, shortness of breath, chest heaviness at the request of Angelina Ok, NP.  She went to any [facility] emergency department on 09/25/2018 with palpitations that have been going on for several weeks.  Intermittent.  Felt some generalized weakness but at that time was denying any chest discomfort.  Lab work in emergency department was unremarkable hemoglobin 11.3 ECG there personally reviewed shows sinus rhythm with PACs.  She also brought some rhythm strips from EMS which show sinus rhythm with PACs as well.  Nonspecific ST-T wave changes.  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  1 of her original episodes occurred when she had a bout of bad diarrhea.  She thought her electrolytes were abnormal.  She felt a typical followed every now and then sometimes several a minute.  Went to the ER, or try to normal.  She now feels as though these are happening daily.  Wants to make sure this is not atrial fibrillation.  The rhythm strips from EMS are PACs.  Overall she feels like these are affecting her life.  She is very independent, gets on ladders for instance, halls logs.  She is wondering about this now.  No significant anginal symptoms but she occasionally will feel some shortness of breath and may be some chest heaviness.  She states that several years ago she had an ovarian ectopic pregnancy rupture bleeding in her peritoneum and actually arrested briefly on the operating table.  Back in 2014 she had a echo stress test that was normal  She has never smoked, no alcohol, no Sudafed, no other stimulant activity.  Past  Medical History:  Diagnosis Date  . Chest pain   . Complication of anesthesia   . Degenerative joint disease   . Depression   . Endometriosis   . Gastroesophageal reflux disease   . Genital herpes   . Migraines    Ceased at menopause  . Osteopenia   . PONV (postoperative nausea and vomiting)     Past Surgical History:  Procedure Laterality Date  . ABDOMINAL HYSTERECTOMY     Single ovary remained in place.  . ABDOMINAL SURGERY    . BIOPSY N/A 04/26/2014   Procedure: GASTRIC BIOPSY;  Surgeon: Danie Binder, MD;  Location: [facility];  Service: Endoscopy;  Laterality: N/A;  . BUNIONECTOMY  2007   Bilateral  . CATARACT EXTRACTION  2012   bilateral; with lens implant  . [place]  . COLONOSCOPY  2000   incomplete due to pain  . COLONOSCOPY WITH PROPOFOL N/A 04/26/2014   Procedure: COLONOSCOPY WITH PROPOFOL;  Surgeon: Danie Binder, MD;  Location: [facility];  Service: Endoscopy;  Laterality: N/A;  in cecum at 0810 out at 0839 = 29 total minutes  . ESOPHAGOGASTRODUODENOSCOPY (EGD) WITH PROPOFOL N/A 04/26/2014   Procedure: ESOPHAGOGASTRODUODENOSCOPY (EGD) WITH PROPOFOL;  Surgeon: Danie Binder, MD;  Location: [facility];  Service: Endoscopy;  Laterality: N/A;  . EXPLORATORY LAPAROTOMY     ovarian ectopic pregnancy  . POLYPECTOMY N/A 04/26/2014   Procedure: POLYPECTOMY;  Surgeon: Danie Binder,  MD;  Location: [facility];  Service: Endoscopy;  Laterality: N/A;  . ROTATOR CUFF REPAIR  2013   Left  . SALPINGOOPHORECTOMY  1971  . SAVORY DILATION N/A 04/26/2014   Procedure: SAVORY DILATION;  Surgeon: Danie Binder, MD;  Location: [facility];  Service: Endoscopy;  Laterality: N/A;  14/15/16  . SEPTOPLASTY  03/23/2016    Current Medications: Current Meds  Medication Sig  . acyclovir (ZOVIRAX) 400 MG tablet Take 400 mg by mouth daily.  . Cholecalciferol (HM VITAMIN D3) 4000 units CAPS Take 1 capsule by mouth every Monday, Wednesday, and Friday.   . citalopram (CELEXA) 20 MG tablet Take 30 mg by  mouth daily. Take 1.5 tabs at bedtime.  . clonazePAM (KLONOPIN) 0.5 MG tablet Take 1 tablet by mouth daily as needed for anxiety.   Marland Kitchen estradiol (ESTRACE VAGINAL) 0.1 MG/GM vaginal cream Apply 0.5mg  (pea-sized amount)  just inside the vaginal introitus with a finger-tip on Monday, Wednesday and Friday nights. (Patient taking differently: Place 1 Applicatorful vaginally 3 (three) times a week. Apply 0.5mg  (pea-sized amount)  just inside the vaginal introitus with a finger-tip on Monday, Wednesday and Friday nights.)  . HYDROcodone-acetaminophen (NORCO/VICODIN) 5-325 MG tablet Take 1 tablet by mouth every 6 (six) hours as needed for moderate pain.  . metoprolol tartrate (LOPRESSOR) 25 MG tablet Take 1 tablet (25 mg total) by mouth 2 (two) times daily.  . mirabegron ER (MYRBETRIQ) 25 MG TB24 tablet Take 25 mg by mouth daily. Pt takes 4 times a week due to cost.  . Multiple Vitamin (MULTIVITAMIN WITH MINERALS) TABS tablet Take 1 tablet by mouth daily.  Marland Kitchen omeprazole (PRILOSEC) 40 MG capsule Take 40 mg by mouth daily.  . RESTASIS 0.05 % ophthalmic emulsion Place 1 drop into both eyes daily.  . simvastatin (ZOCOR) 20 MG tablet Take 20 mg by mouth 4 (four) times a week. Saturdays, Sundays, Tuesdays and Thursdays  . traZODone (DESYREL) 100 MG tablet Take 100 mg by mouth at bedtime.  . TURMERIC PO Take 1 tablet by mouth daily.      Allergies:   Bee venom; Fentanyl and related; and Versed [midazolam]   Social History   Socioeconomic History  . Marital status: Single    Spouse name: Not on file  . Number of children: Not on file  . Years of education: Not on file  . Highest education level: Not on file  Occupational History  . 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 Financial resource strain: Not on file  . Food insecurity:    Worry: Not on file    Inability: Not on file  . Transportation needs:    Medical: Not on file    Non-medical: Not  on file  Tobacco Use  . Smoking status: Former Smoker    Packs/day: 0.50    Years: 2.00    Pack years: 1.00    Types: Cigarettes    Start date: 01/27/1977    Last attempt to quit: 01/27/1979    Years since quitting: 39.6  . Smokeless tobacco: Never Used  Substance and Sexual Activity  . Alcohol use: Yes    Comment: Occasional, 1-2 glasses of wine a month  . Drug use: No  . Sexual activity: Yes    Birth control/protection: Surgical  Lifestyle  . Physical activity:    Days per week: Not on file    Minutes per session: Not on file  . Stress: Not  on file  Relationships  . Social connections:    Talks on phone: Not on file    Gets together: Not on file    Attends religious service: Not on file    Active member of club or organization: Not on file    Attends meetings of clubs or organizations: Not on file    Relationship status: Not on file  Other Topics Concern  . Not on file  Social History Narrative  . Not on file     Family History: The patient's family history includes Breast cancer in her mother; COPD in her mother; Colon cancer (age of onset: 27) in her sister; Macular degeneration in her mother; Parkinson's disease in her father; Stroke in her paternal grandfather; Valvular heart disease in her mother.  ROS:   Please see the history of present illness.     All other systems reviewed and are negative.  EKGs/Labs/Other Studies Reviewed:    The following studies were reviewed today: Prior office notes, lab work, EKG  EKG:  EKG is not ordered today.  EKG previously was abnormal demonstrating PACs sinus rhythm.  Recent Labs: 09/06/2018: ALT 12 09/25/2018: BUN 18; Creatinine, Ser 0.95; Hemoglobin 11.3; Magnesium 2.1; Platelets 222; Potassium 3.9; Sodium 141 09/26/2018: TSH 4.264  Recent Lipid Panel No results found for: CHOL, TRIG, HDL, CHOLHDL, VLDL, LDLCALC, LDLDIRECT  Physical Exam:    VS:  BP 108/62   Pulse 63   Ht 5\' 2"  (1.575 m)   Wt 150 lb (68 kg)   SpO2 99%    BMI 27.44 kg/m     Wt Readings from Last 3 Encounters:  09/29/18 150 lb (68 kg)  09/06/18 147 lb (66.7 kg)  08/06/18 147 lb 3.2 oz (66.8 kg)     GEN:  Well nourished, well developed in no acute distress HEENT: Normal NECK: No JVD; No carotid bruits LYMPHATICS: No lymphadenopathy CARDIAC: RRR with occasional ectopy, no murmurs, rubs, gallops RESPIRATORY:  Clear to auscultation without rales, wheezing or rhonchi  ABDOMEN: Soft, non-tender, non-distended MUSCULOSKELETAL:  No edema; No deformity  SKIN: Warm and dry NEUROLOGIC:  Alert and oriented x 3 PSYCHIATRIC:  Normal affect   ASSESSMENT:    1. Palpitations   2. PAC (premature atrial contraction)   3. Abnormal EKG   4. Mixed hyperlipidemia    PLAN:    In order of problems listed above:  Abnormal EKG/PACs/palpitations -I will check an echocardiogram to ensure proper structure and function -Continue with metoprolol.  She may take an extra metoprolol as needed. -Prior TSH was normal 4.2 -I will place a long-term monitor to further help quantify her ectopy and to make sure that she does not break into atrial fibrillation with her atriopathy which would obviously change therapy.  Hyperlipidemia -Continue with Zocor.  Excellent.  We will see her back on as-needed basis.  Obviously if symptoms worsen or become more worrisome she can let me know.    Medication Adjustments/Labs and Tests Ordered: Current medicines are reviewed at length with the patient today.  Concerns regarding medicines are outlined above.  Orders Placed This Encounter  Procedures  . LONG TERM MONITOR (3-14 DAYS)  . ECHOCARDIOGRAM COMPLETE   No orders of the defined types were placed in this encounter.   Patient Instructions  Medication Instructions:  The current medical regimen is effective;  continue present plan and medications.  Testing/Procedures: Your physician has requested that you have an echocardiogram. Echocardiography is a painless  test that uses sound waves to  create images of your heart. It provides your doctor with information about the size and shape of your heart and how well your heart's chambers and valves are working. This procedure takes approximately one hour. There are no restrictions for this procedure.  Your physician has recommended that you wear an event monitor for 14 days. Event monitors are medical devices that record the heart's electrical activity. Doctors most often [place] these monitors to diagnose arrhythmias. Arrhythmias are problems with the speed or rhythm of the heartbeat. The monitor is a small, portable device. You can wear one while you do your normal daily activities. This is usually used to diagnose what is causing palpitations/syncope (passing out).  Follow-Up: Follow up will be based on the results of the above testing.  Thank you for choosing [facility]!!         Signed, Candee Furbish, MD  09/29/2018 2:18 PM    [place]

## 2018-10-06 ENCOUNTER — Telehealth: Payer: Self-pay | Admitting: Cardiology

## 2018-10-06 ENCOUNTER — Ambulatory Visit (HOSPITAL_COMMUNITY): Payer: Medicare Other | Attending: Cardiovascular Disease

## 2018-10-06 ENCOUNTER — Other Ambulatory Visit: Payer: Self-pay

## 2018-10-06 ENCOUNTER — Ambulatory Visit (INDEPENDENT_AMBULATORY_CARE_PROVIDER_SITE_OTHER): Payer: Medicare Other

## 2018-10-06 DIAGNOSIS — R002 Palpitations: Secondary | ICD-10-CM | POA: Diagnosis not present

## 2018-10-06 DIAGNOSIS — I491 Atrial premature depolarization: Secondary | ICD-10-CM

## 2018-10-06 DIAGNOSIS — R9431 Abnormal electrocardiogram [ECG] [EKG]: Secondary | ICD-10-CM | POA: Insufficient documentation

## 2018-10-06 MED ORDER — METOPROLOL TARTRATE 25 MG PO TABS: 25.0000 mg | ORAL_TABLET | Freq: Two times a day (BID) | ORAL | 3 refills | Status: DC

## 2018-10-06 NOTE — Telephone Encounter (Signed)
Informed patient that I would refill her Metoprolol Tartrate 25 mg with an adequate supply.

## 2018-10-13 ENCOUNTER — Ambulatory Visit: Payer: Medicare Other | Admitting: Cardiology

## 2018-10-30 ENCOUNTER — Ambulatory Visit: Payer: Medicare Other | Admitting: Cardiovascular Disease

## 2018-11-02 ENCOUNTER — Encounter: Payer: Self-pay | Admitting: Urology

## 2018-11-02 ENCOUNTER — Ambulatory Visit (INDEPENDENT_AMBULATORY_CARE_PROVIDER_SITE_OTHER): Payer: Medicare Other | Admitting: Urology

## 2018-11-02 VITALS — BP 118/74 | HR 62 | Ht 62.0 in

## 2018-11-02 DIAGNOSIS — N952 Postmenopausal atrophic vaginitis: Secondary | ICD-10-CM

## 2018-11-02 DIAGNOSIS — N3941 Urge incontinence: Secondary | ICD-10-CM | POA: Diagnosis not present

## 2018-11-02 LAB — BLADDER SCAN AMB NON-IMAGING

## 2018-11-02 NOTE — Progress Notes (Signed)
November 02, 2018 6:16 AM   Megan Ingram August 10, 1946 [id number]  Referring provider: [name], NP [place], [place] [address]  Chief Complaint  Patient presents with  . Follow-up    HPI: Patient is a 72 year old Caucasian female with urge incontinence and vaginal atrophy who presents today for a three month follow up.    Background history Patient is a 29 -year-old Caucasian female who is referred to [place] by [name], NP for recurrent urinary tract infections.  She is a former nephrology nurse.  Patient states her symptoms that started 5 weeks ago.  Reviewing her records,  she has had no documented UTI's.  Her symptoms with a urinary tract infection consist of frequency and urgency.  Not symptomatic at this time.  She denies dysuria, gross hematuria, suprapubic pain, back pain, abdominal pain or flank pain associated with UTI's.  She has not had any recent fevers, chills, nausea or vomiting associated with UTI's.  She does not have a history of nephrolithiasis, GU surgery or GU trauma.  She is postmenopausal.  She denies constipation and/or diarrhea.  She does engage in good perineal hygiene.  She has urge incontinence.  She is not drinking a lot of water daily.  Her UA today is negative.  Her PVR is 36 mL.    At last visit on 08/06/18, she was experiencing frequency and urgency. Patient denies any gross hematuria, dysuria or suprapubic/flank pain.  Patient denies any fevers, chills, nausea or vomiting.  Her PVR was 12 mL.  Her BP was 120/66.   She has found the Myrbetriq 25 mg daily has given her a 75% improvement in her urinary symptoms. She was worried about the cost at this time. She was still experiencing a "twinge" at the end of her urethra.  She was using the vaginal estrogen cream three nights weekly.   Today, she is complaining of urgency and incontinence.  The patient is  experiencing urgency x 0-3, frequency x 4-7, is restricting fluids to avoid visits  to the restroom, is engaging in toilet mapping, incontinence x 0-3 and nocturia x 0-3.   Her BP is 118/74.   Her PVR is 0 mL.   She is not wanting to continue with the Myrbetriq.  She is interested in PTNS and would like to proceed with the treatment.  Patient denies any gross hematuria, dysuria or suprapubic/flank pain.  Patient denies any fevers, chills, nausea or vomiting.    PMH: Past Medical History:  Diagnosis Date  . Chest pain   . Complication of anesthesia   . Degenerative joint disease   . Depression   . Endometriosis   . Gastroesophageal reflux disease   . Genital herpes   . Migraines    Ceased at menopause  . Osteopenia   . PONV (postoperative nausea and vomiting)     Surgical History: Past Surgical History:  Procedure Laterality Date  . ABDOMINAL HYSTERECTOMY     Single ovary remained in place.  . ABDOMINAL SURGERY    . BIOPSY N/A 04/26/2014   Procedure: GASTRIC BIOPSY;  Surgeon: Danie Binder, MD;  Location: [facility];  Service: Endoscopy;  Laterality: N/A;  . BUNIONECTOMY  2007   Bilateral  . CATARACT EXTRACTION  2012   bilateral; with lens implant  . [place]  . COLONOSCOPY  2000   incomplete due to pain  . COLONOSCOPY WITH PROPOFOL N/A 04/26/2014   Procedure: COLONOSCOPY WITH PROPOFOL;  Surgeon: Danie Binder, MD;  Location: [facility];  Service: Endoscopy;  Laterality: N/A;  in cecum at 0810 out at 0839 = 29 total minutes  . ESOPHAGOGASTRODUODENOSCOPY (EGD) WITH PROPOFOL N/A 04/26/2014   Procedure: ESOPHAGOGASTRODUODENOSCOPY (EGD) WITH PROPOFOL;  Surgeon: Danie Binder, MD;  Location: [facility];  Service: Endoscopy;  Laterality: N/A;  . EXPLORATORY LAPAROTOMY     ovarian ectopic pregnancy  . POLYPECTOMY N/A 04/26/2014   Procedure: POLYPECTOMY;  Surgeon: Danie Binder, MD;  Location: [facility];  Service: Endoscopy;  Laterality: N/A;  . ROTATOR CUFF REPAIR  2013   Left  . SALPINGOOPHORECTOMY  1971  . SAVORY DILATION N/A 04/26/2014   Procedure: SAVORY DILATION;   Surgeon: Danie Binder, MD;  Location: [facility];  Service: Endoscopy;  Laterality: N/A;  14/15/16  . SEPTOPLASTY  03/23/2016    Home Medications:  Allergies as of 11/02/2018      Reactions   Bee Venom Anaphylaxis   Fentanyl And Related Hives   Versed [midazolam] Hives      Medication List        Accurate as of 11/02/18 11:59 PM. Always use your most recent med list.          acyclovir 400 MG tablet Commonly known as:  ZOVIRAX Take 400 mg by mouth daily.   citalopram 20 MG tablet Commonly known as:  CELEXA Take 30 mg by mouth daily. Take 1.5 tabs at bedtime.   clonazePAM 0.5 MG tablet Commonly known as:  KLONOPIN Take 1 tablet by mouth daily as needed for anxiety.   estradiol 0.1 MG/GM vaginal cream Commonly known as:  ESTRACE Apply 0.5mg  (pea-sized amount)  just inside the vaginal introitus with a finger-tip on Monday, Wednesday and Friday nights.   HM VITAMIN D3 100 MCG (4000 UT) Caps Generic drug:  Cholecalciferol Take 1 capsule by mouth every Monday, Wednesday, and Friday.   HYDROcodone-acetaminophen 5-325 MG tablet Commonly known as:  NORCO/VICODIN Take 1 tablet by mouth every 6 (six) hours as needed for moderate pain.   metoprolol tartrate 25 MG tablet Commonly known as:  LOPRESSOR Take 1 tablet (25 mg total) by mouth 2 (two) times daily.   mirabegron ER 25 MG Tb24 tablet Commonly known as:  MYRBETRIQ Take 25 mg by mouth daily. Pt takes 4 times a week due to cost.   multivitamin with minerals Tabs tablet Take 1 tablet by mouth daily.   omeprazole 40 MG capsule Commonly known as:  PRILOSEC Take 40 mg by mouth daily.   RESTASIS 0.05 % ophthalmic emulsion Generic drug:  cycloSPORINE Place 1 drop into both eyes daily.   simvastatin 20 MG tablet Commonly known as:  ZOCOR Take 20 mg by mouth 4 (four) times a week. Saturdays, Sundays, Tuesdays and Thursdays   traZODone 100 MG tablet Commonly known as:  DESYREL Take 100 mg by mouth at bedtime.     TURMERIC PO Take 1 tablet by mouth daily.       Allergies:  Allergies  Allergen Reactions  . Bee Venom Anaphylaxis  . Fentanyl And Related Hives  . Versed [Midazolam] Hives    Family History: Family History  Problem Relation Age of Onset  . COPD Mother   . Breast cancer Mother   . Valvular heart disease Mother        aortic valve replacement  . Macular degeneration Mother   . Parkinson's disease Father   . Stroke Paternal Grandfather   . Colon cancer Sister 61  Social History:  reports that she quit smoking about 39 years ago. Her smoking use included cigarettes. She started smoking about 41 years ago. She has a 1.00 pack-year smoking history. She has never used smokeless tobacco. She reports that she drinks alcohol. She reports that she does not use drugs.  ROS: UROLOGY Frequent Urination?: No Hard to postpone urination?: Yes Burning/pain with urination?: No Get up at night to urinate?: No Leakage of urine?: Yes Urine stream starts and stops?: No Trouble starting stream?: No Do you have to strain to urinate?: No Blood in urine?: No Urinary tract infection?: No Sexually transmitted disease?: No Injury to kidneys or bladder?: No Painful intercourse?: No Weak stream?: No Currently pregnant?: No Vaginal bleeding?: No Last menstrual period?: n  Gastrointestinal Nausea?: No Vomiting?: No Indigestion/heartburn?: No Diarrhea?: No Constipation?: No  Constitutional Fever: No Night sweats?: No Weight loss?: No Fatigue?: No  Skin Skin rash/lesions?: No Itching?: No  Eyes Blurred vision?: No Double vision?: No  Ears/Nose/Throat Sore throat?: No Sinus problems?: No  Hematologic/Lymphatic Swollen glands?: No Easy bruising?: No  Cardiovascular Leg swelling?: No Chest pain?: No  Respiratory Cough?: No Shortness of breath?: No  Endocrine Excessive thirst?: No  Musculoskeletal Back pain?: No Joint pain?: No  Neurological Headaches?:  No Dizziness?: No  Psychologic Depression?: No Anxiety?: No  Physical Exam: BP 118/74 (BP Location: Left Arm, Patient Position: Sitting, Cuff Size: Normal)   Pulse 62   Ht 5\' 2"  (1.575 m)   BMI 27.44 kg/m   Constitutional: Well nourished. Alert and oriented, No acute distress. HEENT: [place] AT, moist mucus membranes. Trachea midline, no masses. Cardiovascular: No clubbing, cyanosis, or edema. Respiratory: Normal respiratory effort, no increased work of breathing. GI: Abdomen is soft, non tender, non distended, no abdominal masses. Liver and spleen not palpable.  No hernias appreciated.  Stool sample for occult testing is not indicated.   GU: No CVA tenderness.  No bladder fullness or masses.  Atrophic external genitalia, normal pubic hair distribution, no lesions.  Normal urethral meatus, no lesions, no prolapse, no discharge.   No urethral masses, tenderness and/or tenderness. No bladder fullness, tenderness or masses. Pale vagina mucosa, good estrogen effect, no discharge, no lesions, good pelvic support, grade I cystocele, no rectocele noted.  Cervix, uterus and adnexa are surgically absent.  Anus and perineum are without rashes or lesions.    Skin: No rashes, bruises or suspicious lesions. Lymph: No cervical or inguinal adenopathy. Neurologic: Grossly intact, no focal deficits, moving all 4 extremities. Psychiatric: Normal mood and affect.   Laboratory Data: Lab Results  Component Value Date   WBC 7.0 09/25/2018   HGB 11.3 (L) 09/25/2018   HCT 34.8 (L) 09/25/2018   MCV 91.8 09/25/2018   PLT 222 09/25/2018    Lab Results  Component Value Date   CREATININE 0.95 09/25/2018    No results found for: PSA  No results found for: TESTOSTERONE  No results found for: HGBA1C  Lab Results  Component Value Date   TSH 4.264 09/26/2018    No results found for: CHOL, HDL, CHOLHDL, VLDL, LDLCALC  Lab Results  Component Value Date   AST 22 09/06/2018   Lab Results  Component  Value Date   ALT 12 09/06/2018   No components found for: ALKALINEPHOPHATASE No components found for: BILIRUBINTOTAL  No results found for: ESTRADIOL  I have reviewed the labs.  Pertinent Imaging Results for FALINE, LANGER (MRN [id number]) as of 11/10/2018 06:11  Ref. Range 11/02/2018 14:29  Scan Result Unknown 0 ml   Assessment & Plan:    1. Urge incontinence explained the PTNS provides treatment by indirectly providing electrical stimulation to the nerves responsible for bladder and pelvic floor function - a needle electrode generates an adjustable electrical pulse that travels to the sacral plexus via the tibial nerve which is located in the ankle, among other functions, the sacral nerve plexus regulates bladder and pelvic floor function - treatment protocol requires once-a-week treatments for 12 weeks, 30 minutes per session and many patients begin to see improvements by the 6th treatment. Patients who respond to treatment may require occasional treatments (~ once every 3 weeks) to sustain improvements. PTNS is a low-risk procedure. The most common side-effects with PTNS treatment are temporary and minor, resulting from the placement of the needle electrode. They include minor bleeding, mild pain and skin inflammation and patients have seen up to an 80% success rate with this form of treatment  - RTC for PTNS  2. Vaginal atrophy Continue the vaginal estrogen cream                   Return for Schedule PTNS .  These notes generated with voice recognition software. I apologize for typographical errors.  Zara Council, PA-C  [facility] [address]  [place], [place] [address] [phone]  I, Baldwin [place], am acting as a Education administrator for [employer]  I have reviewed the above documentation for accuracy and completeness, and I agree with the above.    Zara Council, PA-C

## 2018-11-05 ENCOUNTER — Ambulatory Visit: Payer: Medicare Other | Admitting: Urology

## 2018-11-10 ENCOUNTER — Ambulatory Visit (INDEPENDENT_AMBULATORY_CARE_PROVIDER_SITE_OTHER): Payer: Medicare Other

## 2018-11-10 DIAGNOSIS — N3941 Urge incontinence: Secondary | ICD-10-CM | POA: Diagnosis not present

## 2018-11-10 NOTE — Progress Notes (Signed)
PTNS  Session # 1  Health & Social Factors: no change Caffeine: 2 Alcohol: 0 Daytime voids #per day: 8 Night-time voids #per night: 0 Urgency: strong Incontinence Episodes #per day: 1 Ankle used: left Treatment Setting: 4 Feeling/ Response: both  Preformed By: Fonnie Jarvis, CMA   Follow Up: next week

## 2018-11-17 ENCOUNTER — Ambulatory Visit (INDEPENDENT_AMBULATORY_CARE_PROVIDER_SITE_OTHER): Payer: Medicare Other

## 2018-11-17 DIAGNOSIS — N3941 Urge incontinence: Secondary | ICD-10-CM | POA: Diagnosis not present

## 2018-11-17 NOTE — Progress Notes (Signed)
PTNS  Session # 2  Health & Social Factors: No Change  Caffeine: 2 Alcohol: 0 Daytime voids #per day: 7 Night-time voids #per night: 1 Urgency: Strong Incontinence Episodes #per day: 1 per week Ankle used: Right Treatment Setting: 4 Feeling/ Response: Sensory Comments: N/A  Preformed By: Gordy Clement, CMA (AAMA)  Follow Up: As Scheduled

## 2018-11-24 ENCOUNTER — Ambulatory Visit (INDEPENDENT_AMBULATORY_CARE_PROVIDER_SITE_OTHER): Payer: Medicare Other

## 2018-11-24 DIAGNOSIS — N3941 Urge incontinence: Secondary | ICD-10-CM | POA: Diagnosis not present

## 2018-11-24 NOTE — Progress Notes (Signed)
PTNS  Session # 3  Health & Social Factors: No Change Caffeine: 2 Alcohol: 0 Daytime voids #per day: 7 Night-time voids #per night: 0 Urgency: strong Incontinence Episodes #per day: 1 Ankle used: left Treatment Setting: 1 Feeling/ Response: Sensory  Preformed By: Cristie Hem, CMA  Follow Up: 1 week

## 2018-12-01 ENCOUNTER — Ambulatory Visit (INDEPENDENT_AMBULATORY_CARE_PROVIDER_SITE_OTHER): Payer: Medicare Other

## 2018-12-01 DIAGNOSIS — N3941 Urge incontinence: Secondary | ICD-10-CM | POA: Diagnosis not present

## 2018-12-01 NOTE — Progress Notes (Signed)
PTNS  Session # 4  Health & Social Factors: same Caffeine: 2 Alcohol: 0 Daytime voids #per day: 6-7 Night-time voids #per night: 0 Urgency: mild Incontinence Episodes #per day: 0 Ankle used: left Treatment Setting: 3 Feeling/ Response: both Comments: none  Preformed By: Fonnie Jarvis, CMA   Follow Up: 1 week

## 2018-12-08 ENCOUNTER — Ambulatory Visit (INDEPENDENT_AMBULATORY_CARE_PROVIDER_SITE_OTHER): Payer: Medicare Other | Admitting: Cardiology

## 2018-12-08 ENCOUNTER — Encounter: Payer: Self-pay | Admitting: Cardiology

## 2018-12-08 ENCOUNTER — Ambulatory Visit: Payer: Medicare Other

## 2018-12-08 VITALS — BP 118/78 | HR 57 | Ht 62.0 in | Wt 158.0 lb

## 2018-12-08 DIAGNOSIS — I700 Atherosclerosis of aorta: Secondary | ICD-10-CM

## 2018-12-08 DIAGNOSIS — E782 Mixed hyperlipidemia: Secondary | ICD-10-CM

## 2018-12-08 DIAGNOSIS — R002 Palpitations: Secondary | ICD-10-CM | POA: Diagnosis not present

## 2018-12-08 DIAGNOSIS — I491 Atrial premature depolarization: Secondary | ICD-10-CM | POA: Diagnosis not present

## 2018-12-08 DIAGNOSIS — I471 Supraventricular tachycardia: Secondary | ICD-10-CM

## 2018-12-08 MED ORDER — ATORVASTATIN CALCIUM 20 MG PO TABS: 20.0000 mg | ORAL_TABLET | Freq: Every day | ORAL | 3 refills | Status: DC

## 2018-12-08 MED ORDER — DILTIAZEM HCL ER COATED BEADS 120 MG PO CP24: 120.0000 mg | ORAL_CAPSULE | Freq: Every day | ORAL | 3 refills | Status: DC

## 2018-12-08 MED ORDER — DILTIAZEM HCL 30 MG PO TABS: 30.0000 mg | ORAL_TABLET | Freq: Every day | ORAL | 11 refills | Status: DC | PRN

## 2018-12-08 NOTE — Progress Notes (Signed)
Cardiology Office Note:    Date:  12/08/2018   ID:  Megan Ingram, DOB [birth year], MRN [id number]  PCP:  [name], NP  Cardiologist:  No primary care provider on file.  Electrophysiologist:  None   Referring MD: [name], NP     History of Present Illness:    Megan Ingram is a 72 y.o. female is here for follow-up of palpitations monitor results and echo results.  I saw her on September 29, 2018 for evaluation of palpitation shortness of breath chest heaviness.  She was in the [facility] emergency department in early October with palpitations that is been going on for several weeks, intermittent some generalized weakness.  Lab work was unremarkable hemoglobin however was slightly low at 11.3.  EKG showed sinus rhythm with PACs.  She also brought in personally some strips from EMS that showed sinus rhythm with PACs as well.  Nonspecific ST changes.  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  1 of her episodes of more pronounced palpitations occurred after diarrhea.  She thought that her electrolytes likely were abnormal.  She was worried because they felt more consistent, daily.  They are affecting her life.  She is quite independent.  During her previous encounter she told me that several years ago she had an ectopic ovarian pregnancy rupture with bleeding and actually arrested briefly on the operating table.  Never smoked.  No Sudafed, no stimulants, prior stress echo in 2014 was normal.  Her episodes also occur when she lays on her left side, mostly evening hours.  Annoying.  She was discussing this with 1 of her nursing friends who is on the board of [place] with her.  ECHO 10/06/18:  - Left ventricle: The cavity size was normal. Systolic function was   normal. The estimated ejection fraction was in the range of 55%   to 60%. Wall motion was normal; there were no regional wall  motion abnormalities. Left ventricular diastolic function   parameters were normal. - Aortic valve: Transvalvular velocity was within the normal range.   There was no stenosis. There was mild regurgitation.   Regurgitation pressure half-time: 542 ms. - Aorta: Ascending aortic diameter: 38 mm (S). - Ascending aorta: The ascending aorta was mildly dilated. - Mitral valve: Transvalvular velocity was within the normal range.   There was no evidence for stenosis. There was mild regurgitation. - Left atrium: The atrium was severely dilated. - Right ventricle: The cavity size was normal. Wall thickness was   normal. Systolic function was normal. - Tricuspid valve: There was trivial regurgitation. - Pulmonary arteries: Systolic pressure was mildly increased. PA   peak pressure: 29 mm Hg (S).  10/06/18 ECHO:  Occasional PACs noted.  Brief paroxysmal atrial tachycardia noted.   No atrial fibrillation, no ventricular tachycardia.   Reassuring  Past Medical History:  Diagnosis Date  . Chest pain   . Complication of anesthesia   . Degenerative joint disease   . Depression   . Endometriosis   . Gastroesophageal reflux disease   . Genital herpes   . Migraines    Ceased at menopause  . Osteopenia   . PONV (postoperative nausea and vomiting)     Past Surgical History:  Procedure Laterality Date  . ABDOMINAL HYSTERECTOMY     Single ovary remained in place.  . ABDOMINAL SURGERY    . BIOPSY N/A 04/26/2014  Procedure: GASTRIC BIOPSY;  Surgeon: Danie Binder, MD;  Location: [facility];  Service: Endoscopy;  Laterality: N/A;  . BUNIONECTOMY  2007   Bilateral  . CATARACT EXTRACTION  2012   bilateral; with lens implant  . [place]  . COLONOSCOPY  2000   incomplete due to pain  . COLONOSCOPY WITH PROPOFOL N/A 04/26/2014   Procedure: COLONOSCOPY WITH PROPOFOL;  Surgeon: Danie Binder, MD;  Location: [facility];  Service: Endoscopy;  Laterality: N/A;  in cecum at 0810 out at 0839 =  29 total minutes  . ESOPHAGOGASTRODUODENOSCOPY (EGD) WITH PROPOFOL N/A 04/26/2014   Procedure: ESOPHAGOGASTRODUODENOSCOPY (EGD) WITH PROPOFOL;  Surgeon: Danie Binder, MD;  Location: [facility];  Service: Endoscopy;  Laterality: N/A;  . EXPLORATORY LAPAROTOMY     ovarian ectopic pregnancy  . POLYPECTOMY N/A 04/26/2014   Procedure: POLYPECTOMY;  Surgeon: Danie Binder, MD;  Location: [facility];  Service: Endoscopy;  Laterality: N/A;  . ROTATOR CUFF REPAIR  2013   Left  . SALPINGOOPHORECTOMY  1971  . SAVORY DILATION N/A 04/26/2014   Procedure: SAVORY DILATION;  Surgeon: Danie Binder, MD;  Location: [facility];  Service: Endoscopy;  Laterality: N/A;  14/15/16  . SEPTOPLASTY  03/23/2016    Current Medications: Current Meds  Medication Sig  . acyclovir (ZOVIRAX) 400 MG tablet Take 400 mg by mouth daily.  . Cholecalciferol (HM VITAMIN D3) 4000 units CAPS Take 1 capsule by mouth every Monday, Wednesday, and Friday.   . citalopram (CELEXA) 20 MG tablet Take 30 mg by mouth daily. Take 1.5 tabs at bedtime.  . clonazePAM (KLONOPIN) 0.5 MG tablet Take 1 tablet by mouth daily as needed for anxiety.   Marland Kitchen estradiol (ESTRACE VAGINAL) 0.1 MG/GM vaginal cream Apply 0.5mg  (pea-sized amount)  just inside the vaginal introitus with a finger-tip on Monday, Wednesday and Friday nights. (Patient taking differently: Place 1 Applicatorful vaginally 3 (three) times a week. Apply 0.5mg  (pea-sized amount)  just inside the vaginal introitus with a finger-tip on Monday, Wednesday and Friday nights.)  . HYDROcodone-acetaminophen (NORCO/VICODIN) 5-325 MG tablet Take 1 tablet by mouth every 6 (six) hours as needed for moderate pain.  . Multiple Vitamin (MULTIVITAMIN WITH MINERALS) TABS tablet Take 1 tablet by mouth daily.  Marland Kitchen omeprazole (PRILOSEC) 40 MG capsule Take 40 mg by mouth daily.  . RESTASIS 0.05 % ophthalmic emulsion Place 1 drop into both eyes daily.  . traZODone (DESYREL) 100 MG tablet Take 100 mg by mouth at bedtime.  .  TURMERIC PO Take 1 tablet by mouth daily.   . [DISCONTINUED] metoprolol tartrate (LOPRESSOR) 25 MG tablet Take 1 tablet (25 mg total) by mouth 2 (two) times daily.  . [DISCONTINUED] simvastatin (ZOCOR) 20 MG tablet Take 20 mg by mouth 4 (four) times a week. Saturdays, Sundays, Tuesdays and Thursdays     Allergies:   Bee venom; Fentanyl and related; and Versed [midazolam]   Social History   Socioeconomic History  . Marital status: Single    Spouse name: Not on file  . Number of children: Not on file  . Years of education: Not on file  . Highest education level: Not on file  Occupational History  . 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 Financial resource strain: Not on file  . Food insecurity:    Worry: Not on file    Inability: Not on file  . Transportation needs:    Medical: Not  on file    Non-medical: Not on file  Tobacco Use  . Smoking status: Former Smoker    Packs/day: 0.50    Years: 2.00    Pack years: 1.00    Types: Cigarettes    Start date: 01/27/1977    Last attempt to quit: 01/27/1979    Years since quitting: 39.8  . Smokeless tobacco: Never Used  Substance and Sexual Activity  . Alcohol use: Yes    Comment: Occasional, 1-2 glasses of wine a month  . Drug use: No  . Sexual activity: Yes    Birth control/protection: Surgical  Lifestyle  . Physical activity:    Days per week: Not on file    Minutes per session: Not on file  . Stress: Not on file  Relationships  . Social connections:    Talks on phone: Not on file    Gets together: Not on file    Attends religious service: Not on file    Active member of club or organization: Not on file    Attends meetings of clubs or organizations: Not on file    Relationship status: Not on file  Other Topics Concern  . Not on file  Social History Narrative  . Not on file     Family History: The patient's family history includes Breast cancer in her mother;  COPD in her mother; Colon cancer (age of onset: 100) in her sister; Macular degeneration in her mother; Parkinson's disease in her father; Stroke in her paternal grandfather; Valvular heart disease in her mother.  ROS:   Please see the history of present illness.     All other systems reviewed and are negative.  EKGs/Labs/Other Studies Reviewed:    The following studies were reviewed today: Higher office notes lab work monitor echo EKG  EKG:  EKG is not ordered today.    Recent Labs: 09/06/2018: ALT 12 09/25/2018: BUN 18; Creatinine, Ser 0.95; Hemoglobin 11.3; Magnesium 2.1; Platelets 222; Potassium 3.9; Sodium 141 09/26/2018: TSH 4.264  Recent Lipid Panel No results found for: CHOL, TRIG, HDL, CHOLHDL, VLDL, LDLCALC, LDLDIRECT  Physical Exam:    VS:  BP 118/78   Pulse (!) 57   Ht 5\' 2"  (1.575 m)   Wt 158 lb (71.7 kg)   SpO2 97%   BMI 28.90 kg/m     Wt Readings from Last 3 Encounters:  12/08/18 158 lb (71.7 kg)  09/29/18 150 lb (68 kg)  09/06/18 147 lb (66.7 kg)     GEN:  Well nourished, well developed in no acute distress HEENT: Normal NECK: No JVD; No carotid bruits LYMPHATICS: No lymphadenopathy CARDIAC: RRR, no murmurs, rubs, gallops RESPIRATORY:  Clear to auscultation without rales, wheezing or rhonchi  ABDOMEN: Soft, non-tender, non-distended MUSCULOSKELETAL:  No edema; No deformity  SKIN: Warm and dry NEUROLOGIC:  Alert and oriented x 3 PSYCHIATRIC:  Normal affect   ASSESSMENT:    1. Paroxysmal atrial tachycardia (HCC)   2. Palpitations   3. PAC (premature atrial contraction)   4. Mixed hyperlipidemia   5. Aortic atherosclerosis (HCC)    PLAN:    In order of problems listed above:  Palpitations/paroxysmal atrial tachycardia - Noted on event monitor occasional short bursts 5-7 beats.  Frequent PACs as well.  These are quite annoying to her especially when she lays on her left side, in the evening hours.  Ask about taking metoprolol especially since she  is on antidepressants, her psychiatrist did not like this idea.  We will go  ahead and stop the metoprolol and we will give her diltiazem CD 120 mg once a day.  I will also give her 30 mg of diltiazem short acting to take as needed especially in the evening hours if she is having symptoms.  We once again reviewed the monitor results, I showed this to her personally.  PAT noted.  No adverse arrhythmias. - If diltiazem does not work or if her blood pressure gets too low or she feels any dizzy side effects with this, we could possibly consider an antiarrhythmic such as flecainide.  I would however like for her to discuss this further with electrophysiology if it comes to that.  Mild aortic valve and mitral valve regurgitation -Should be of no clinical consequence at this point.  Normal ejection fraction, normal left ventricular dimensions.  Aortic atherosclerosis - At one point she was worked up to help donate a kidney and she was denied because of aortic atherosclerosis and renal artery atherosclerosis as well.  We will continue with statin therapy although I will change her from simvastatin over to atorvastatin.  We will see her back in 3 months to see how she is doing with this therapy.  Medication Adjustments/Labs and Tests Ordered: Current medicines are reviewed at length with the patient today.  Concerns regarding medicines are outlined above.  No orders of the defined types were placed in this encounter.  Meds ordered this encounter  Medications  . diltiazem (CARDIZEM CD) 120 MG 24 hr capsule    Sig: Take 1 capsule (120 mg total) by mouth daily.    Dispense:  90 capsule    Refill:  3  . atorvastatin (LIPITOR) 20 MG tablet    Sig: Take 1 tablet (20 mg total) by mouth daily.    Dispense:  90 tablet    Refill:  3  . diltiazem (CARDIZEM) 30 MG tablet    Sig: Take 1 tablet (30 mg total) by mouth daily as needed (for palpitations).    Dispense:  30 tablet    Refill:  11    Patient  Instructions  Medication Instructions:  1) STOP METOPROLOL 2) START CARDIZEM CD 120 mg daily 3) START CARDIZEM 30 mg to take once daily as needed for palpitations  4) STOP ZOCOR (simvastatin) 5) START LIPITOR (atorvastatin) 20 mg daily  Labwork: None  Testing/Procedures: None  Follow-Up: You have an appointment scheduled with Dr. Marlou Porch on 03/15/2019 at 2:40PM.      Signed, Candee Furbish, MD  12/08/2018 3:01 PM    [place]

## 2018-12-08 NOTE — Patient Instructions (Addendum)
Medication Instructions:  1) STOP METOPROLOL 2) START CARDIZEM CD 120 mg daily 3) START CARDIZEM 30 mg to take once daily as needed for palpitations  4) STOP ZOCOR (simvastatin) 5) START LIPITOR (atorvastatin) 20 mg daily  Labwork: None  Testing/Procedures: None  Follow-Up: You have an appointment scheduled with Dr. Marlou Porch on 03/15/2019 at 2:40PM.

## 2018-12-10 ENCOUNTER — Ambulatory Visit (INDEPENDENT_AMBULATORY_CARE_PROVIDER_SITE_OTHER): Payer: Medicare Other | Admitting: Urology

## 2018-12-10 DIAGNOSIS — N3941 Urge incontinence: Secondary | ICD-10-CM | POA: Diagnosis not present

## 2018-12-10 NOTE — Progress Notes (Signed)
PTNS  Session # 5  Health & Social Factors: no change Caffeine: 1 Alcohol: 0 Daytime voids #per day: 6-8 Night-time voids #per night: 2 Urgency: mild Incontinence Episodes #per day: 0 Ankle used: right Treatment Setting: 6 Feeling/ Response: sensory Comments: patient tolerated well  Preformed By: Elberta Leatherwood, CMA  Follow Up:  1 week #6

## 2018-12-10 NOTE — Progress Notes (Signed)
Error

## 2018-12-14 ENCOUNTER — Ambulatory Visit (INDEPENDENT_AMBULATORY_CARE_PROVIDER_SITE_OTHER): Payer: Medicare Other

## 2018-12-14 DIAGNOSIS — N3941 Urge incontinence: Secondary | ICD-10-CM | POA: Diagnosis not present

## 2018-12-14 NOTE — Progress Notes (Signed)
PTNS  Session #6   Health & Social Factors: None Caffeine: 1 Alcohol: 0 Daytime voids #per day: 6-7 Night-time voids #per night: 0 Urgency: 1 Incontinence Episodes #per day: 0 Ankle used: Left Treatment Setting: 4 Feeling/ Response: Both Comments: Patient reports doing kegels  Preformed By: Gordy Clement, CMA   Follow Up: Next week as scheduled

## 2018-12-21 ENCOUNTER — Ambulatory Visit (INDEPENDENT_AMBULATORY_CARE_PROVIDER_SITE_OTHER): Payer: Medicare Other

## 2018-12-21 DIAGNOSIS — N3941 Urge incontinence: Secondary | ICD-10-CM

## 2018-12-21 NOTE — Progress Notes (Signed)
PTNS  Session # 7  Health & Social Factors: None Caffeine: 1 Alcohol: 0 Daytime voids #per day: 5-6 Night-time voids #per night: 0 Urgency: 0 Incontinence Episodes #per day: 0 Ankle used: Right Treatment Setting: 2 Feeling/ Response: Both Comments: Patient is continuing to do kegels  Preformed By: Sherril Cong, CMA (AAMA)  Follow Up: As Scheduled

## 2018-12-29 ENCOUNTER — Ambulatory Visit (INDEPENDENT_AMBULATORY_CARE_PROVIDER_SITE_OTHER): Payer: Medicare Other | Admitting: Family Medicine

## 2018-12-29 DIAGNOSIS — N3941 Urge incontinence: Secondary | ICD-10-CM

## 2018-12-29 NOTE — Progress Notes (Signed)
PTNS  Session # 9  Health & Social Factors: no change Caffeine: 2 Alcohol: 0 Daytime voids #per day: 6 Night-time voids #per night: 0 Urgency: none Incontinence Episodes #per day: 0 Ankle used: right Treatment Setting: 2 Feeling/ Response: both Comments: Patient tolerated well  Preformed By: Elberta Leatherwood, CMA  Follow Up: 1 week #10

## 2019-01-05 ENCOUNTER — Ambulatory Visit: Payer: Medicare Other | Admitting: Family Medicine

## 2019-01-05 DIAGNOSIS — N3941 Urge incontinence: Secondary | ICD-10-CM

## 2019-01-05 NOTE — Progress Notes (Signed)
PTNS  Session # 9  Health & Social Factors: no change Caffeine: 2 Alcohol: 0 Daytime voids #per day: 6 Night-time voids #per night: 0 Urgency: none Incontinence Episodes #per day: 0 Ankle used: left Treatment Setting: 5 Feeling/ Response: both Comments: patient tolerated well  Preformed By: Elberta Leatherwood, CMA  Follow Up: 1 week

## 2019-01-12 ENCOUNTER — Ambulatory Visit (INDEPENDENT_AMBULATORY_CARE_PROVIDER_SITE_OTHER): Payer: Medicare Other

## 2019-01-12 DIAGNOSIS — N3941 Urge incontinence: Secondary | ICD-10-CM

## 2019-01-12 NOTE — Progress Notes (Signed)
PTNS  Session # 10  Health & Social Factors: No Change Caffeine: 2 Alcohol: 0 Daytime voids #per day: 6 Night-time voids #per night: 0  Urgency: None  Incontinence Episodes #per day: 0 Ankle used: Right Treatment Setting: 5 Feeling/ Response: Both Comments: N/A  Preformed DG:LOVFIEP Nadezhda Pollitt, CMA (AAMA)  Follow Up: As scheduled

## 2019-01-19 ENCOUNTER — Ambulatory Visit: Payer: Medicare Other

## 2019-01-19 DIAGNOSIS — N3941 Urge incontinence: Secondary | ICD-10-CM

## 2019-01-19 NOTE — Progress Notes (Signed)
PTNS  Session # 11 Health & Social Factors: same Caffeine: 1 Alcohol: 0 Daytime voids #per day: 5 Night-time voids #per night: 0 Urgency: none Incontinence Episodes #per day: 0 Ankle used: left Treatment Setting: 3 Feeling/ Response: sensory Comments: patient tolerated well  Preformed By: Shawnie Dapper, CMA  Assistant: Fonnie Jarvis, CMA  Follow Up: as scheduled

## 2019-01-26 ENCOUNTER — Ambulatory Visit (INDEPENDENT_AMBULATORY_CARE_PROVIDER_SITE_OTHER): Payer: Medicare Other

## 2019-01-26 DIAGNOSIS — N3941 Urge incontinence: Secondary | ICD-10-CM

## 2019-01-26 NOTE — Patient Instructions (Signed)
Patient Name:______________________________________  Tracking your bladder symptoms Sample: Day Daytime Voids Number of Accidents Nighttime voids Urgenty for the day (0-4) Comments  Mon IIII 4 I 1 II 2 2 0=none-4=severe I had more coffee than usual today.   Week Starting:____________________________________ Day Daytime Voids Number of Accidents Nighttime voids Urgency for the day (0-4) Comments                                                                        This week my symptoms were:  O much better  O better O the same O worse  Week Starting:____________________________________ Day Daytime Voids Number of Accidents Nighttime voids Urgency for the day (0-4) Comments                                                                        This week my symptoms were:  O much better  O better O the same O worse

## 2019-01-26 NOTE — Progress Notes (Signed)
  PTNS  Session # 12  Health & Social Factors: same Caffeine: 1 Alcohol: 0 Daytime voids #per day: 5-6 Night-time voids #per night: 0 Urgency: 0 Incontinence Episodes #per day: 0 Ankle used: left Treatment Setting: 2 Feeling/ Response: both Comments: none  Preformed By: Fonnie Jarvis, CMA  Follow Up: 1 month

## 2019-02-12 IMAGING — DX DG CHEST 2V
2 series · 2 of 2 positions shown · non-contrast
Comparison: Chest radiograph 01/28/2014

CLINICAL DATA: Chest pain

EXAM:
CHEST  2 VIEW

[chest lat]
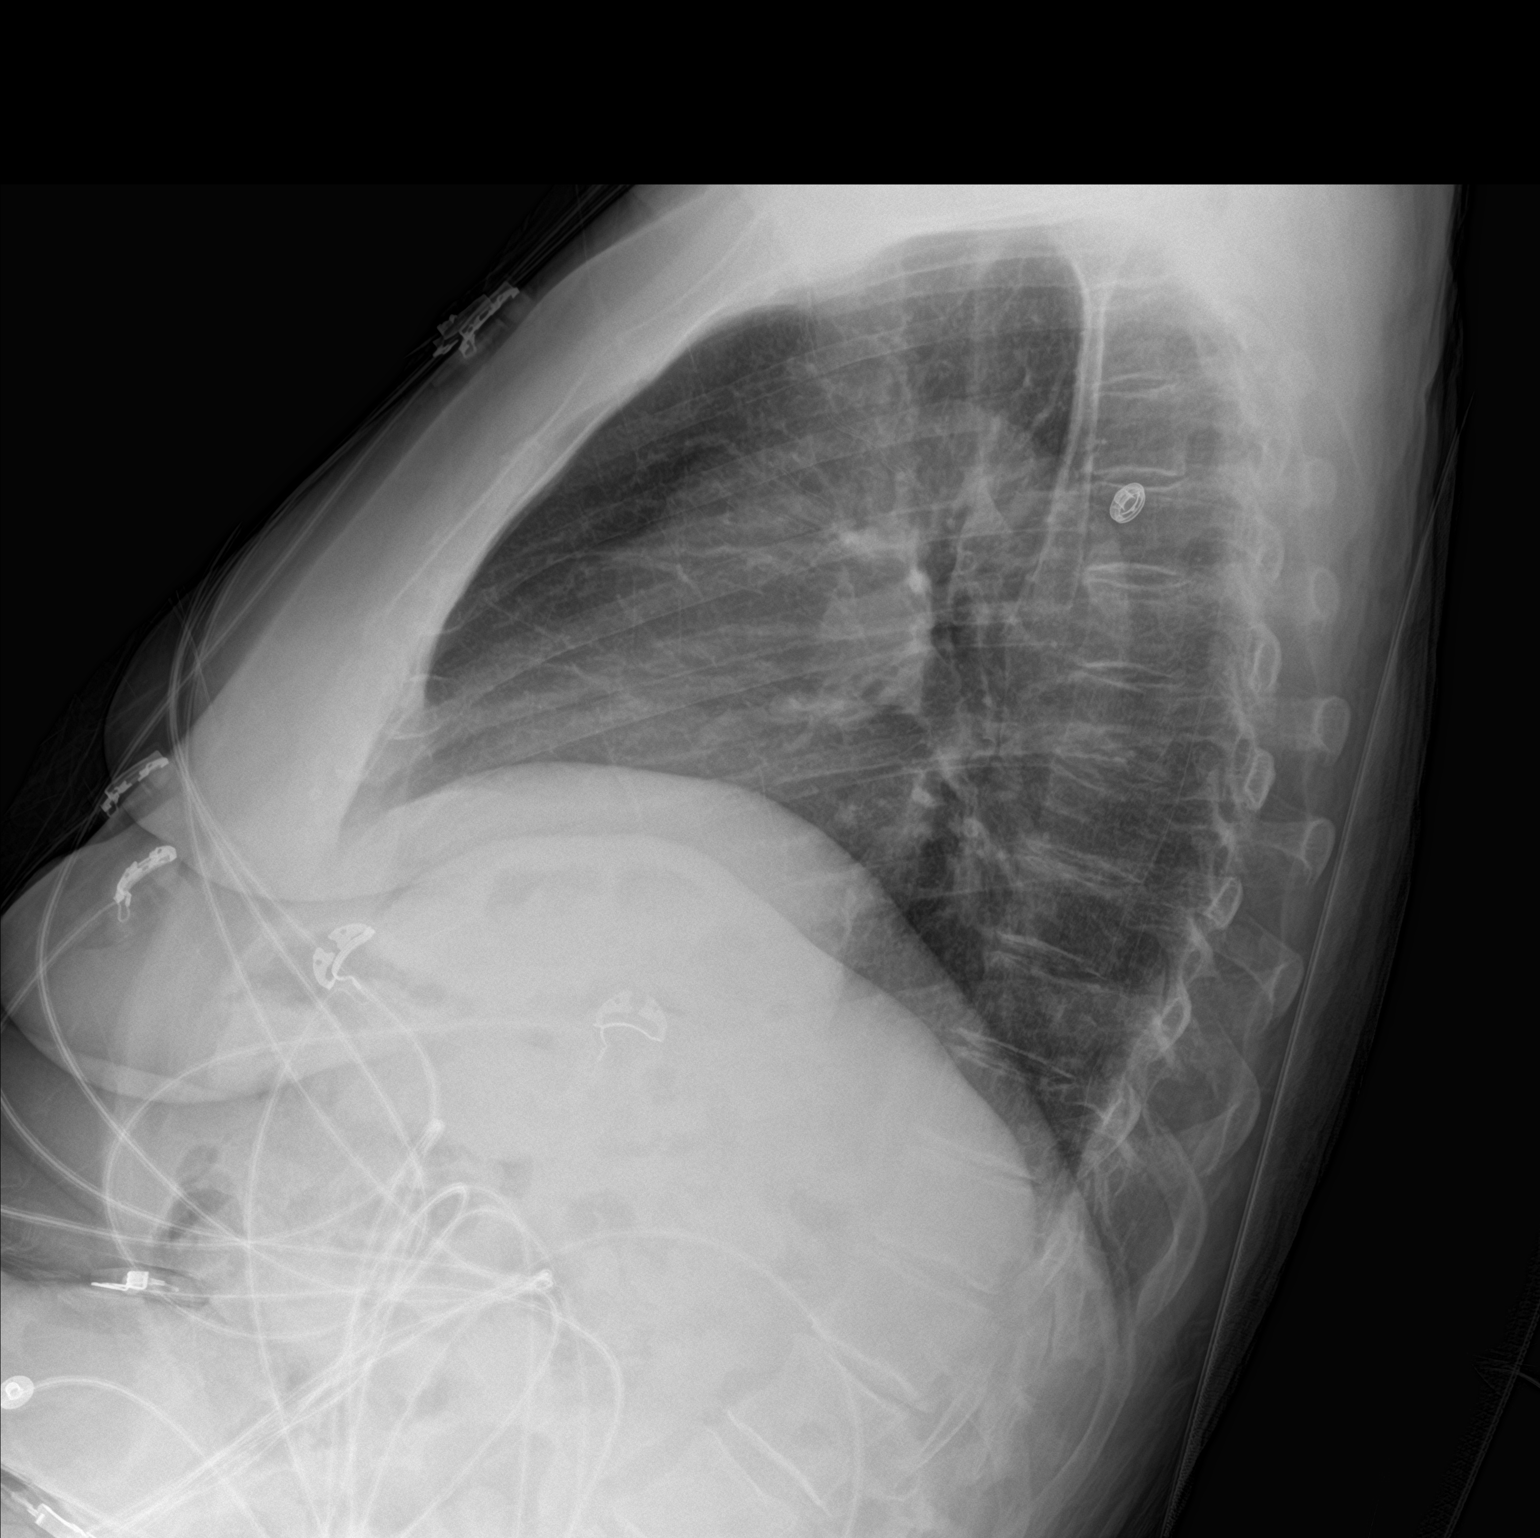

[chest ap]
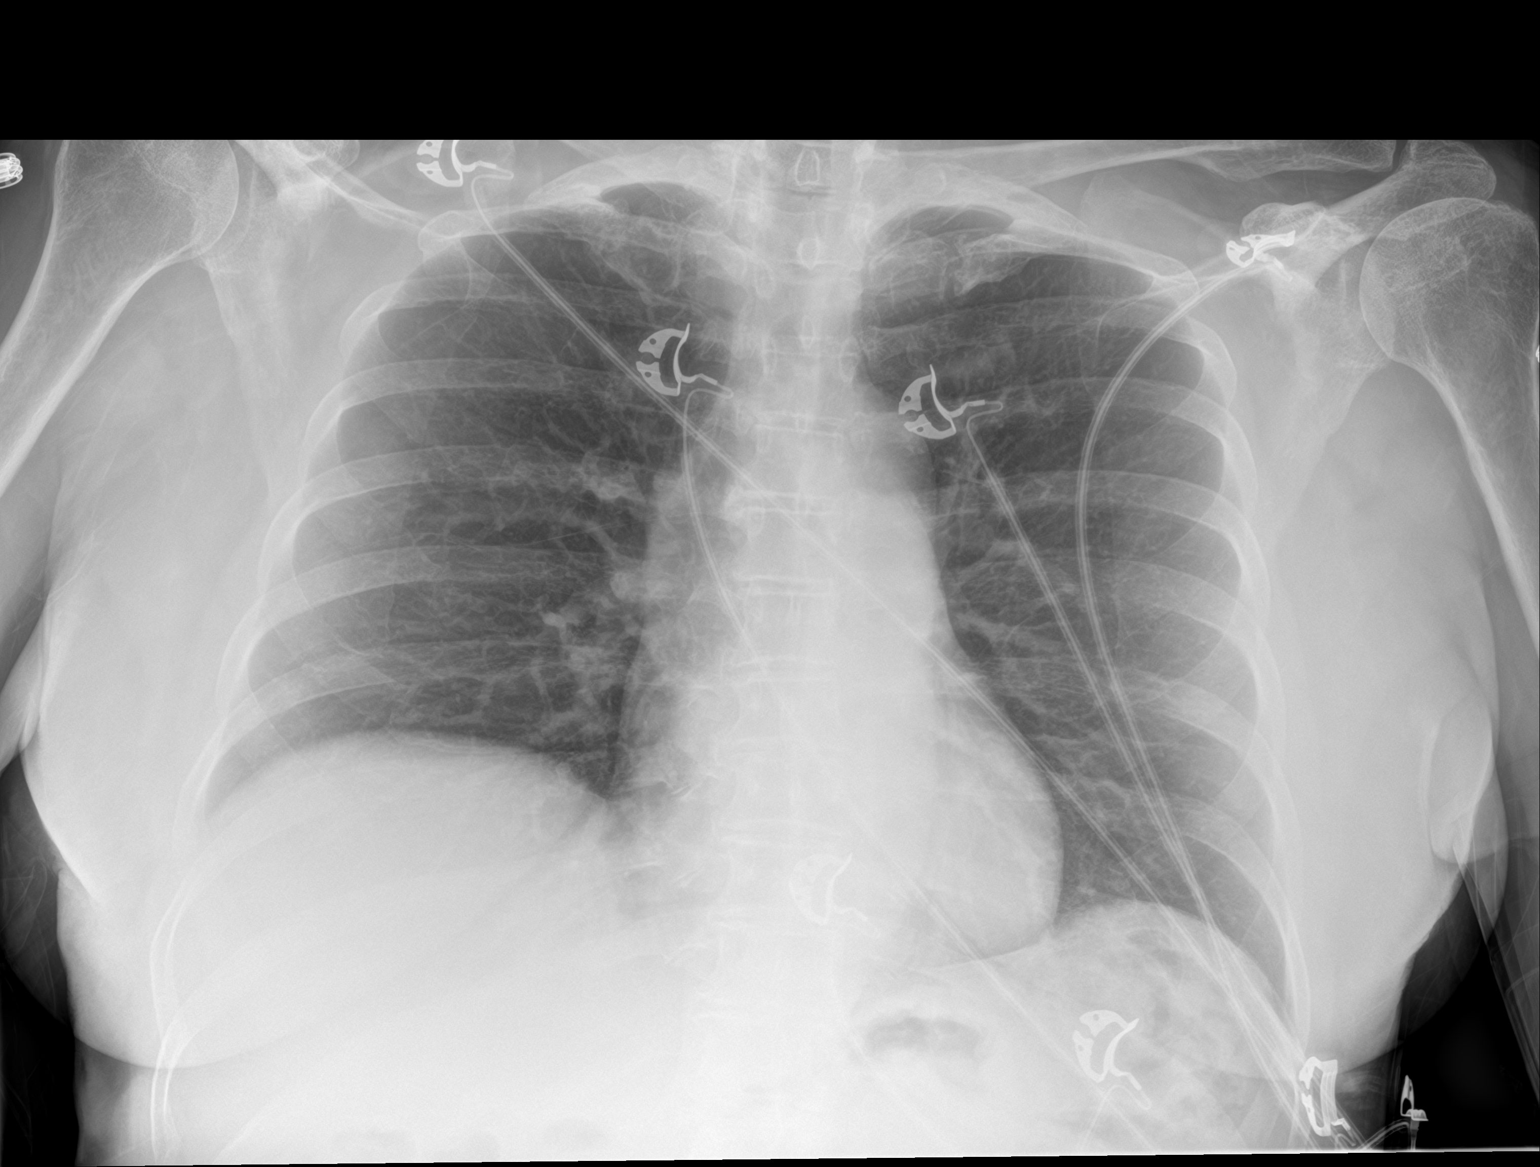

[2 of 2 positions shown; findings below may reference images not displayed]

FINDINGS: The heart size and mediastinal contours are within normal limits.
Both lungs are clear. The visualized skeletal structures are
unremarkable.
IMPRESSION: No active cardiopulmonary disease.

## 2019-02-18 ENCOUNTER — Other Ambulatory Visit: Payer: Self-pay

## 2019-02-18 ENCOUNTER — Encounter: Payer: Self-pay | Admitting: Urology

## 2019-02-18 ENCOUNTER — Ambulatory Visit (INDEPENDENT_AMBULATORY_CARE_PROVIDER_SITE_OTHER): Payer: Medicare Other | Admitting: Urology

## 2019-02-18 VITALS — BP 116/71 | HR 81 | Resp 14 | Ht 62.0 in

## 2019-02-18 DIAGNOSIS — N3941 Urge incontinence: Secondary | ICD-10-CM

## 2019-02-18 LAB — BLADDER SCAN AMB NON-IMAGING: SCAN RESULT: 10

## 2019-02-18 NOTE — Progress Notes (Signed)
02/18/2019 8:09 AM   Megan Ingram 1945/12/27 [id number]  Referring provider: [name], NP [place], [place] [address]  Chief Complaint  Patient presents with  . Follow-up    HPI: Patient is a 73 year old Caucasian female with urge incontinence and vaginal atrophy who presents today for a  month follow up after completing a 12 week course of PTNS.    According to the OAB questionnaire, the patient is experiencing urgency x 0-3 (stable), frequency x 4-7 (stable), is restricting fluids to avoid visits to the restroom, is engaging in toilet mapping, incontinence x 0-3 (stable) and nocturia x 0-3 (stable).   Her BP is 116/71. Her PVR is 10 mL. Her previous PVR was 0 mL.   She was pleased with PTNS and has no bladder problems.  Her biggest improvement is urinary urgency. Her frequency has reduced from 8 to 5x which she is pleased with as well.   Background history Patient is a 67 -year-old Caucasian female who is referred to [place] by [name], NP for recurrent urinary tract infections.  She is a former nephrology nurse.  She has had no documented UTI's.  Her symptoms with a urinary tract infection consist of frequency and urgency.  Not symptomatic at this time.  She denies dysuria, gross hematuria, suprapubic pain, back pain, abdominal pain or flank pain associated with UTI's.  She has not had any recent fevers, chills, nausea or vomiting associated with UTI's.  She does not have a history of nephrolithiasis, GU surgery or GU trauma.  She is postmenopausal.  She denies constipation and/or diarrhea.  She does engage in good perineal hygiene.  She has urge incontinence.  She is not drinking a lot of water daily.      PMH: Past Medical History:  Diagnosis Date  . Chest pain   . Complication of anesthesia   . Degenerative joint disease   . Depression   . Endometriosis   . Gastroesophageal reflux disease   . Genital herpes   . Migraines    Ceased at menopause    . Osteopenia   . PONV (postoperative nausea and vomiting)     Surgical History: Past Surgical History:  Procedure Laterality Date  . ABDOMINAL HYSTERECTOMY     Single ovary remained in place.  . ABDOMINAL SURGERY    . BIOPSY N/A 04/26/2014   Procedure: GASTRIC BIOPSY;  Surgeon: Danie Binder, MD;  Location: [facility];  Service: Endoscopy;  Laterality: N/A;  . BUNIONECTOMY  2007   Bilateral  . CATARACT EXTRACTION  2012   bilateral; with lens implant  . [place]  . COLONOSCOPY  2000   incomplete due to pain  . COLONOSCOPY WITH PROPOFOL N/A 04/26/2014   Procedure: COLONOSCOPY WITH PROPOFOL;  Surgeon: Danie Binder, MD;  Location: [facility];  Service: Endoscopy;  Laterality: N/A;  in cecum at 0810 out at 0839 = 29 total minutes  . ESOPHAGOGASTRODUODENOSCOPY (EGD) WITH PROPOFOL N/A 04/26/2014   Procedure: ESOPHAGOGASTRODUODENOSCOPY (EGD) WITH PROPOFOL;  Surgeon: Danie Binder, MD;  Location: [facility];  Service: Endoscopy;  Laterality: N/A;  . EXPLORATORY LAPAROTOMY     ovarian ectopic pregnancy  . POLYPECTOMY N/A 04/26/2014   Procedure: POLYPECTOMY;  Surgeon: Danie Binder, MD;  Location: [facility];  Service: Endoscopy;  Laterality: N/A;  . ROTATOR CUFF REPAIR  2013   Left  . SALPINGOOPHORECTOMY  1971  . SAVORY DILATION N/A 04/26/2014   Procedure:  SAVORY DILATION;  Surgeon: Danie Binder, MD;  Location: [facility];  Service: Endoscopy;  Laterality: N/A;  14/15/16  . SEPTOPLASTY  03/23/2016    Home Medications:  Allergies as of 02/18/2019      Reactions   Bee Venom Anaphylaxis   Fentanyl And Related Hives   Versed [midazolam] Hives      Medication List       Accurate as of February 18, 2019 11:59 PM. Always use your most recent med list.        acyclovir 400 MG tablet Commonly known as:  ZOVIRAX Take 400 mg by mouth daily.   atorvastatin 20 MG tablet Commonly known as:  LIPITOR Take 1 tablet (20 mg total) by mouth daily.   citalopram 20 MG tablet Commonly known as:   CELEXA Take 30 mg by mouth daily. Take 1.5 tabs at bedtime.   clonazePAM 0.5 MG tablet Commonly known as:  KLONOPIN Take 1 tablet by mouth daily as needed for anxiety.   diltiazem 120 MG 24 hr capsule Commonly known as:  CARDIZEM CD Take 1 capsule (120 mg total) by mouth daily.   diltiazem 30 MG tablet Commonly known as:  CARDIZEM Take 1 tablet (30 mg total) by mouth daily as needed (for palpitations).   estradiol 0.1 MG/GM vaginal cream Commonly known as:  ESTRACE VAGINAL Apply 0.5mg  (pea-sized amount)  just inside the vaginal introitus with a finger-tip on Monday, Wednesday and Friday nights.   HM VITAMIN D3 100 MCG (4000 UT) Caps Generic drug:  Cholecalciferol Take 1 capsule by mouth every Monday, Wednesday, and Friday.   HYDROcodone-acetaminophen 5-325 MG tablet Commonly known as:  NORCO/VICODIN Take 1 tablet by mouth every 6 (six) hours as needed for moderate pain.   multivitamin with minerals Tabs tablet Take 1 tablet by mouth daily.   omeprazole 40 MG capsule Commonly known as:  PRILOSEC Take 40 mg by mouth daily.   RESTASIS 0.05 % ophthalmic emulsion Generic drug:  cycloSPORINE Place 1 drop into both eyes daily.   traZODone 100 MG tablet Commonly known as:  DESYREL Take 100 mg by mouth at bedtime.   TURMERIC PO Take 1 tablet by mouth daily.       Allergies:  Allergies  Allergen Reactions  . Bee Venom Anaphylaxis  . Fentanyl And Related Hives  . Versed [Midazolam] Hives    Family History: Family History  Problem Relation Age of Onset  . COPD Mother   . Breast cancer Mother   . Valvular heart disease Mother        aortic valve replacement  . Macular degeneration Mother   . Parkinson's disease Father   . Stroke Paternal Grandfather   . Colon cancer Sister 29    Social History:  reports that she quit smoking about 40 years ago. Her smoking use included cigarettes. She started smoking about 42 years ago. She has a 1.00 pack-year smoking history.  She has never used smokeless tobacco. She reports current alcohol use. She reports that she does not use drugs.  ROS: UROLOGY Frequent Urination?: No Hard to postpone urination?: No Burning/pain with urination?: No Get up at night to urinate?: No Leakage of urine?: No Urine stream starts and stops?: No Trouble starting stream?: No Do you have to strain to urinate?: No Blood in urine?: No Urinary tract infection?: No Sexually transmitted disease?: No Injury to kidneys or bladder?: No Painful intercourse?: No Weak stream?: No Currently pregnant?: No Vaginal bleeding?: No Last menstrual period?: n  Gastrointestinal Nausea?: No Vomiting?: No Indigestion/heartburn?: No Diarrhea?: No Constipation?: No  Constitutional Fever: No Night sweats?: No Weight loss?: No Fatigue?: No  Skin Skin rash/lesions?: No Itching?: No  Eyes Blurred vision?: No Double vision?: No  Ears/Nose/Throat Sore throat?: No Sinus problems?: No  Hematologic/Lymphatic Swollen glands?: No Easy bruising?: No  Cardiovascular Leg swelling?: No Chest pain?: No  Respiratory Cough?: No Shortness of breath?: No  Endocrine Excessive thirst?: No  Musculoskeletal Back pain?: No Joint pain?: No  Neurological Headaches?: No Dizziness?: No  Psychologic Depression?: No Anxiety?: No  Physical Exam: BP 116/71   Pulse 81   Resp 14   Ht 5\' 2"  (1.575 m)   BMI 28.90 kg/m   Constitutional:  Well nourished. Alert and oriented, No acute distress. HEENT: [place] AT, moist mucus membranes.  Trachea midline, no masses. Cardiovascular: No clubbing, cyanosis, or edema. Respiratory: Normal respiratory effort, no increased work of breathing. Skin: No rashes, bruises or suspicious lesions. Neurologic: Grossly intact, no focal deficits, moving all 4 extremities. Psychiatric: Normal mood and affect.  Laboratory Data:  I have reviewed the labs.  Pertinent Imaging Results for orders placed or performed  in visit on 02/18/19  Bladder Scan (Post Void Residual) in office  Result Value Ref Range   Scan Result 10    Assessment & Plan:    1. Urge incontinence  - Underwent PTNS treatment, pt very pleased with results, no bladder problems   - PVR is 10 mL   - PTNS today   - RTC for PTNS  2. Vaginal atrophy Continue the vaginal estrogen cream                 Return in about 1 month (around 03/19/2019) for Maintenance PTNS.  These notes generated with voice recognition software. I apologize for typographical errors.  Zara Council, PA-C  [facility] [address]  [place], [place] [address] [phone]  I, Lucas Mallow, am acting as a Education administrator for [employer],  I have reviewed the above documentation for accuracy and completeness, and I agree with the above.    Zara Council, PA-C

## 2019-02-18 NOTE — Progress Notes (Signed)
PTNS  Session # monthly  Health & Social Factors: patient is doing much better Caffeine: 1 Alcohol: 0 Daytime voids #per day: 6 Night-time voids #per night: 0 Urgency: None Incontinence Episodes #per day: 0 Ankle used: right Treatment Setting: 1 Feeling/ Response: sensory Comments: Patient tolerated well  Preformed By: Elberta Leatherwood, CMA  Follow Up: 1 month

## 2019-03-03 ENCOUNTER — Encounter: Payer: Self-pay | Admitting: Cardiology

## 2019-03-10 ENCOUNTER — Telehealth: Payer: Self-pay

## 2019-03-10 NOTE — Telephone Encounter (Signed)
Ms. Prescher has an appointment with Dr. Marlou Porch 3/23. She states she feels good and is tolerating the diltiazem well.  She is happy to reschedule her appointment due to COVID-19 pandemic. Rescheduled her to 5/5 with Dr. Marlou Porch. She understands to call prior to that time if she has any problems or concerns.

## 2019-03-15 ENCOUNTER — Ambulatory Visit: Payer: Medicare Other | Admitting: Cardiology

## 2019-03-16 ENCOUNTER — Ambulatory Visit: Payer: Medicare Other

## 2019-04-13 ENCOUNTER — Ambulatory Visit: Payer: Medicare Other

## 2019-04-27 ENCOUNTER — Ambulatory Visit: Payer: Medicare Other | Admitting: Cardiology

## 2019-06-04 ENCOUNTER — Ambulatory Visit: Payer: Medicare Other

## 2019-06-11 ENCOUNTER — Other Ambulatory Visit: Payer: Self-pay

## 2019-06-11 ENCOUNTER — Ambulatory Visit (INDEPENDENT_AMBULATORY_CARE_PROVIDER_SITE_OTHER): Payer: Medicare Other

## 2019-06-11 DIAGNOSIS — N3941 Urge incontinence: Secondary | ICD-10-CM

## 2019-06-11 NOTE — Progress Notes (Signed)
PTNS  Session # monthly  Health & Social Factors: no change Caffeine: 1 Alcohol: 0 Daytime voids #per day: 5-6 Night-time voids #per night: 0 Urgency: mild Incontinence Episodes #per day: 0 Ankle used: left Treatment Setting: 2 Feeling/ Response: both Comments: Patient tolerated well  Preformed By: Shawnie Dapper, CMA  Follow Up: 1 month

## 2019-07-16 ENCOUNTER — Other Ambulatory Visit: Payer: Self-pay

## 2019-07-16 ENCOUNTER — Ambulatory Visit (INDEPENDENT_AMBULATORY_CARE_PROVIDER_SITE_OTHER): Payer: Medicare Other | Admitting: *Deleted

## 2019-07-16 DIAGNOSIS — N3941 Urge incontinence: Secondary | ICD-10-CM | POA: Diagnosis not present

## 2019-07-16 NOTE — Progress Notes (Signed)
PTNS  Session # Monthly  Health & Social Factors: No change Caffeine: 1 Alcohol: 0 Daytime voids #per day: 6 Night-time voids #per night: 1 Urgency: mild Incontinence Episodes #per day: 1 Ankle used: right Treatment Setting: 2 Feeling/ Response: Both  Preformed By: Verlene Mayer, CMA

## 2019-08-20 ENCOUNTER — Other Ambulatory Visit: Payer: Self-pay

## 2019-08-20 ENCOUNTER — Encounter: Payer: Self-pay | Admitting: *Deleted

## 2019-08-20 ENCOUNTER — Ambulatory Visit (INDEPENDENT_AMBULATORY_CARE_PROVIDER_SITE_OTHER): Payer: Medicare Other | Admitting: *Deleted

## 2019-08-20 VITALS — Ht 62.0 in

## 2019-08-20 DIAGNOSIS — N3941 Urge incontinence: Secondary | ICD-10-CM | POA: Diagnosis not present

## 2019-08-20 NOTE — Progress Notes (Signed)
PTNS  Session # Monthly  Health & Social Factors: No change Caffeine: 1 Alcohol: 0 Daytime voids #per day: 6 Night-time voids #per night: 1 Urgency: Mild Incontinence Episodes #per day: 1 Ankle used: Right Treatment Setting: 2 Feeling/ Response: Both    Performed By: Verlene Mayer, CMA  Follow up in one month

## 2019-08-23 ENCOUNTER — Encounter: Payer: Self-pay | Admitting: Cardiology

## 2019-08-23 ENCOUNTER — Ambulatory Visit (INDEPENDENT_AMBULATORY_CARE_PROVIDER_SITE_OTHER): Payer: Medicare Other | Admitting: Cardiology

## 2019-08-23 ENCOUNTER — Other Ambulatory Visit: Payer: Self-pay

## 2019-08-23 VITALS — BP 112/70 | HR 73 | Ht 62.0 in | Wt 155.0 lb

## 2019-08-23 DIAGNOSIS — E782 Mixed hyperlipidemia: Secondary | ICD-10-CM | POA: Diagnosis not present

## 2019-08-23 DIAGNOSIS — R9431 Abnormal electrocardiogram [ECG] [EKG]: Secondary | ICD-10-CM

## 2019-08-23 DIAGNOSIS — I700 Atherosclerosis of aorta: Secondary | ICD-10-CM

## 2019-08-23 DIAGNOSIS — R002 Palpitations: Secondary | ICD-10-CM

## 2019-08-23 DIAGNOSIS — I491 Atrial premature depolarization: Secondary | ICD-10-CM

## 2019-08-23 NOTE — Patient Instructions (Signed)
  Medication Instructions:  The current medical regimen is effective;  continue present plan and medications.  If you need a refill on your cardiac medications before your next appointment, please call your pharmacy.   Follow-Up: Follow up in 1 year with Dr. Skains.  You will receive a letter in the mail 2 months before you are due.  Please call us when you receive this letter to schedule your follow up appointment.  Thank you for choosing [facility]!!     

## 2019-08-23 NOTE — Progress Notes (Signed)
Cardiology Office Note:    Date:  08/23/2019   ID:  Megan Ingram, DOB December 01, 1946, MRN [id number]  PCP:  [name], NP  Cardiologist:  No primary care provider on file.  Electrophysiologist:  None   Referring MD: [name], NP     History of Present Illness:    Megan Ingram is a 73 y.o. female is here for follow-up of palpitations monitor results and echo results.  I saw her on September 29, 2018 for evaluation of palpitation shortness of breath chest heaviness.  She was in the [facility] emergency department in early October with palpitations that is been going on for several weeks, intermittent some generalized weakness.  Lab work was unremarkable hemoglobin however was slightly low at 11.3.  EKG showed sinus rhythm with PACs.  She also brought in personally some strips from EMS that showed sinus rhythm with PACs as well.  Nonspecific ST changes.  She is a retired Therapist, sports.  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  1 of her episodes of more pronounced palpitations occurred after diarrhea.  She thought that her electrolytes likely were abnormal.  She was worried because they felt more consistent, daily.  They are affecting her life.  She is quite independent.  During her previous encounter she told me that several years ago she had an ectopic ovarian pregnancy rupture with bleeding and actually arrested briefly on the operating table.  Never smoked.  No Sudafed, no stimulants, prior stress echo in 2014 was normal.  Her episodes also occur when she lays on her left side, mostly evening hours.  Annoying.  She was discussing this with 1 of her nursing friends who is on the board of [place] with her.  08/23/2019-here for the follow-up of palpitations PAT PACs.  No longer on metoprolol, now on diltiazem. Busy, with volunteer work.  Only took one rescue. Dilt.  Doing well. No CP.  She had some Mardi Gras beads. She has  been doing very well.  Feels good.  ECHO 10/06/18:  - Left ventricle: The cavity size was normal. Systolic function was   normal. The estimated ejection fraction was in the range of 55%   to 60%. Wall motion was normal; there were no regional wall   motion abnormalities. Left ventricular diastolic function   parameters were normal. - Aortic valve: Transvalvular velocity was within the normal range.   There was no stenosis. There was mild regurgitation.   Regurgitation pressure half-time: 542 ms. - Aorta: Ascending aortic diameter: 38 mm (S). - Ascending aorta: The ascending aorta was mildly dilated. - Mitral valve: Transvalvular velocity was within the normal range.   There was no evidence for stenosis. There was mild regurgitation. - Left atrium: The atrium was severely dilated. - Right ventricle: The cavity size was normal. Wall thickness was   normal. Systolic function was normal. - Tricuspid valve: There was trivial regurgitation. - Pulmonary arteries: Systolic pressure was mildly increased. PA   peak pressure: 29 mm Hg (S).  10/06/18 ECHO:  Occasional PACs noted.  Brief paroxysmal atrial tachycardia noted.   No atrial fibrillation, no ventricular tachycardia.   Reassuring  Past Medical History:  Diagnosis Date   Chest pain    Complication of anesthesia    Degenerative joint disease    Depression    Endometriosis    Gastroesophageal reflux disease    Genital herpes    Migraines  Ceased at menopause   Osteopenia    PONV (postoperative nausea and vomiting)     Past Surgical History:  Procedure Laterality Date   ABDOMINAL HYSTERECTOMY     Single ovary remained in place.   ABDOMINAL SURGERY     BIOPSY N/A 04/26/2014   Procedure: GASTRIC BIOPSY;  Surgeon: Danie Binder, MD;  Location: [facility];  Service: Endoscopy;  Laterality: N/A;   BUNIONECTOMY  2007   Bilateral   CATARACT EXTRACTION  2012   bilateral; with lens implant   [place]   incomplete due to pain   COLONOSCOPY WITH PROPOFOL N/A 04/26/2014   Procedure: COLONOSCOPY WITH PROPOFOL;  Surgeon: Danie Binder, MD;  Location: [facility];  Service: Endoscopy;  Laterality: N/A;  in cecum at 0810 out at 0839 = 29 total minutes   ESOPHAGOGASTRODUODENOSCOPY (EGD) WITH PROPOFOL N/A 04/26/2014   Procedure: ESOPHAGOGASTRODUODENOSCOPY (EGD) WITH PROPOFOL;  Surgeon: Danie Binder, MD;  Location: [facility];  Service: Endoscopy;  Laterality: N/A;   EXPLORATORY LAPAROTOMY     ovarian ectopic pregnancy   POLYPECTOMY N/A 04/26/2014   Procedure: POLYPECTOMY;  Surgeon: Danie Binder, MD;  Location: [facility];  Service: Endoscopy;  Laterality: N/A;   ROTATOR CUFF REPAIR  2013   Left   SALPINGOOPHORECTOMY  1971   SAVORY DILATION N/A 04/26/2014   Procedure: SAVORY DILATION;  Surgeon: Danie Binder, MD;  Location: [facility];  Service: Endoscopy;  Laterality: N/A;  14/15/16   SEPTOPLASTY  03/23/2016    Current Medications: Current Meds  Medication Sig   acyclovir (ZOVIRAX) 400 MG tablet Take 400 mg by mouth daily.   atorvastatin (LIPITOR) 20 MG tablet Take 20 mg by mouth 4 (four) times a week.   Cholecalciferol (HM VITAMIN D3) 4000 units CAPS Take 1 capsule by mouth every Monday, Wednesday, and Friday.    citalopram (CELEXA) 20 MG tablet Take 20 mg by mouth daily.   clonazePAM (KLONOPIN) 0.5 MG tablet Take 1 tablet by mouth daily as needed for anxiety.    diltiazem (CARDIZEM CD) 120 MG 24 hr capsule Take 1 capsule (120 mg total) by mouth daily.   diltiazem (CARDIZEM) 30 MG tablet Take 1 tablet (30 mg total) by mouth daily as needed (for palpitations).   estradiol (ESTRACE VAGINAL) 0.1 MG/GM vaginal cream Apply 0.5mg  (pea-sized amount)  just inside the vaginal introitus with a finger-tip on Monday, Wednesday and Friday nights.   HYDROcodone-acetaminophen (NORCO/VICODIN) 5-325 MG tablet Take 1 tablet by mouth every 6 (six) hours as needed for moderate pain.    Multiple Vitamin (MULTIVITAMIN WITH MINERALS) TABS tablet Take 1 tablet by mouth daily.   omeprazole (PRILOSEC) 40 MG capsule Take 40 mg by mouth daily.   traZODone (DESYREL) 100 MG tablet Take 100 mg by mouth at bedtime.   TURMERIC PO Take 1 tablet by mouth daily.      Allergies:   Bee venom, Fentanyl and related, and Versed [midazolam]   Social History   Socioeconomic History   Marital status: Single    Spouse name: Not on file   Number of children: Not on file   Years of education: Not on file   Highest education level: Not on file  Occupational History   Occupation: RN    Comment: End-stage renaldisease-on dialysis; retired in 2013  Social Needs   Financial resource strain: Not on file   Food insecurity    Worry: Not on file  Inability: Not on file   Transportation needs    Medical: Not on file    Non-medical: Not on file  Tobacco Use   Smoking status: Former Smoker    Packs/day: 0.50    Years: 2.00    Pack years: 1.00    Types: Cigarettes    Start date: 01/27/1977    Quit date: 01/27/1979    Years since quitting: 40.5   Smokeless tobacco: Never Used  Substance and Sexual Activity   Alcohol use: Yes    Comment: Occasional, 1-2 glasses of wine a month   Drug use: No   Sexual activity: Yes    Birth control/protection: Surgical  Lifestyle   Physical activity    Days per week: Not on file    Minutes per session: Not on file   Stress: Not on file  Relationships   Social connections    Talks on phone: Not on file    Gets together: Not on file    Attends religious service: Not on file    Active member of club or organization: Not on file    Attends meetings of clubs or organizations: Not on file    Relationship status: Not on file  Other Topics Concern   Not on file  Social History Narrative   Not on file     Family History: The patient's family history includes Breast cancer in her mother; COPD in her mother; Colon cancer (age of onset:  24) in her sister; Macular degeneration in her mother; Parkinson's disease in her father; Stroke in her paternal grandfather; Valvular heart disease in her mother.  ROS:   Please see the history of present illness.     All other systems reviewed and are negative.  EKGs/Labs/Other Studies Reviewed:    The following studies were reviewed today: Higher office notes lab work monitor echo EKG  EKG:  EKG is not ordered today.    Recent Labs: 09/06/2018: ALT 12 09/25/2018: BUN 18; Creatinine, Ser 0.95; Hemoglobin 11.3; Magnesium 2.1; Platelets 222; Potassium 3.9; Sodium 141 09/26/2018: TSH 4.264  Recent Lipid Panel No results found for: CHOL, TRIG, HDL, CHOLHDL, VLDL, LDLCALC, LDLDIRECT  Physical Exam:    VS:  Pulse 73    Ht 5\' 2"  (1.575 m)    Wt 155 lb (70.3 kg)    SpO2 98%    BMI 28.35 kg/m     Wt Readings from Last 3 Encounters:  08/23/19 155 lb (70.3 kg)  12/08/18 158 lb (71.7 kg)  09/29/18 150 lb (68 kg)     GEN: Well nourished, well developed, in no acute distress  HEENT: normal  Neck: no JVD, carotid bruits, or masses Cardiac: RRR; no murmurs, rubs, or gallops,no edema  Respiratory:  clear to auscultation bilaterally, normal work of breathing GI: soft, nontender, nondistended, + BS MS: no deformity or atrophy  Skin: warm and dry, no rash Neuro:  Alert and Oriented x 3, Strength and sensation are intact Psych: euthymic mood, full affect   ASSESSMENT:    1. Palpitations   2. PAC (premature atrial contraction)   3. Mixed hyperlipidemia   4. Aortic atherosclerosis ([place])   5. Abnormal EKG    PLAN:    In order of problems listed above:  Palpitations/paroxysmal atrial tachycardia - Noted on event monitor occasional short bursts 5-7 beats.  Frequent PACs as well.  These are quite annoying to her especially when she lays on her left side, in the evening hours.    Asked previously  about taking metoprolol especially since she is on antidepressants, her psychiatrist  preferred not using metoprolol but would be okay with diltiazem.  This sounds good.  We stopped the metoprolol and we will gave her diltiazem CD 120 mg once a day.  I also gave her 30 mg of diltiazem short acting to take as needed especially in the evening hours if she is having symptoms.  She only had to use the rescue diltiazem 30 mg once.  February 2020.  We once again reviewed the monitor results, I showed this to her personally.  PAT noted.  No adverse arrhythmias.   Mild aortic valve and mitral valve regurgitation -Should be of no clinical consequence at this point.  Normal ejection fraction, normal left ventricular dimensions. Reassuring. Reviewed  Aortic atherosclerosis - At one point she was worked up to help donate a kidney and she was denied because of aortic atherosclerosis and renal artery atherosclerosis as well. We changed her from simvastatin over to atorvastatin at prior visit. LDL excellent.    Medication Adjustments/Labs and Tests Ordered: Current medicines are reviewed at length with the patient today.  Concerns regarding medicines are outlined above.  No orders of the defined types were placed in this encounter.  No orders of the defined types were placed in this encounter.   Patient Instructions  Medication Instructions:  The current medical regimen is effective;  continue present plan and medications.  If you need a refill on your cardiac medications before your next appointment, please call your pharmacy.   Follow-Up: Follow up in 1 year with Dr. Marlou Porch.  You will receive a letter in the mail 2 months before you are due.  Please call us when you receive this letter to schedule your follow up appointment.  Thank you for choosing [facility]!!         Signed, Candee Furbish, MD  08/23/2019 3:47 PM    [place]

## 2019-09-17 ENCOUNTER — Ambulatory Visit: Payer: Medicare Other

## 2019-09-20 ENCOUNTER — Ambulatory Visit (INDEPENDENT_AMBULATORY_CARE_PROVIDER_SITE_OTHER): Payer: Medicare Other

## 2019-09-20 ENCOUNTER — Other Ambulatory Visit: Payer: Self-pay

## 2019-09-20 DIAGNOSIS — N3941 Urge incontinence: Secondary | ICD-10-CM

## 2019-09-20 NOTE — Progress Notes (Signed)
PTNS  Session # Monthly   Health & Social Factors:   Caffeine: 1 Alcohol: 0 Daytime voids #per day: 6 Night-time voids #per nigtst: 1 Urgency: mild Incontinence Episodes #per day: 1 Ankle used: left Treatment Setting:2 Feeling/ Response: both Comments: N/A   Preformed By: Gaspar Cola CMA  Assistant: Fonnie Jarvis   Follow Up: in one month

## 2019-10-04 ENCOUNTER — Telehealth: Payer: Self-pay | Admitting: Gastroenterology

## 2019-10-04 NOTE — Telephone Encounter (Signed)
Routing to triage nurse.

## 2019-10-04 NOTE — Telephone Encounter (Signed)
PLEASE CALL PT AND TRIAGE FOR TCS  W/ MAC , DX: FAMILY HISTORY OF COLON CANCER.  SHE SHOULD AVOID NUTS THREE DAYS PRIOR TO HER COLONOSCOPY, FOLLOW FULL LIQUIDS 2 DAYS BEFORE COLONOSCOPY, AND CLEAR LIQUIDS THE DAY BEFORE HER COLONOSCOPY.

## 2019-10-04 NOTE — Telephone Encounter (Signed)
Pt had tcs in 2015. She is on recall for 2022. She said it should be 5 yr from her last colonoscopy which would be this year. I saw a note in her chart from 09/2016 where SF said next colonoscopy due in 5 yrs and she was NIC'ed on recall for 5 yrs from 2017. Be verify when pt needs next colonoscopy.

## 2019-10-05 NOTE — Telephone Encounter (Signed)
Pt will need an OV due to meds. Please schedule ov.

## 2019-10-07 ENCOUNTER — Encounter: Payer: Self-pay | Admitting: Gastroenterology

## 2019-10-07 NOTE — Telephone Encounter (Signed)
OV made and letter mailed °

## 2019-10-18 NOTE — Progress Notes (Signed)
PTNS  Session # Monthly   Health & Social Factors: No change Caffeine: 1 Alcohol: 0 Daytime voids #per day: 5 Night-time voids #per night: 0 Urgency: mild Incontinence Episodes #per day: 1 Ankle used: Left Treatment Setting: 6 Feeling/ Response: Both sensory and toe flex  Comments: tolerated well   Performed By: Zara Council, PA-C  Follow Up: one month

## 2019-10-19 ENCOUNTER — Other Ambulatory Visit: Payer: Self-pay | Admitting: *Deleted

## 2019-10-19 ENCOUNTER — Other Ambulatory Visit: Payer: Self-pay

## 2019-10-19 ENCOUNTER — Ambulatory Visit (INDEPENDENT_AMBULATORY_CARE_PROVIDER_SITE_OTHER): Payer: Medicare Other | Admitting: Urology

## 2019-10-19 ENCOUNTER — Encounter: Payer: Self-pay | Admitting: Urology

## 2019-10-19 VITALS — BP 86/52 | HR 86 | Ht 62.0 in | Wt 157.0 lb

## 2019-10-19 DIAGNOSIS — N3941 Urge incontinence: Secondary | ICD-10-CM

## 2019-10-19 DIAGNOSIS — Z20822 Contact with and (suspected) exposure to covid-19: Secondary | ICD-10-CM

## 2019-10-21 LAB — NOVEL CORONAVIRUS, NAA: SARS-CoV-2, NAA: NOT DETECTED

## 2019-11-17 ENCOUNTER — Ambulatory Visit: Payer: Medicare Other | Admitting: Gastroenterology

## 2019-11-22 NOTE — Progress Notes (Signed)
PTNS  Session # Monthly  Health & Social Factors: no change Caffeine: 1 Alcohol: 0 Daytime voids #per day: 5 Night-time voids #per night: 0 Urgency: mild Incontinence Episodes #per day:  Ankle used: right Treatment Setting: 3 Feeling/ Response: sensory and toe flex Comments: tolerated well  Preformed By: Elberta Leatherwood, CMA  Follow Up: one month

## 2019-11-23 ENCOUNTER — Ambulatory Visit (INDEPENDENT_AMBULATORY_CARE_PROVIDER_SITE_OTHER): Payer: Medicare Other | Admitting: Urology

## 2019-11-23 ENCOUNTER — Other Ambulatory Visit: Payer: Self-pay

## 2019-11-23 DIAGNOSIS — N3941 Urge incontinence: Secondary | ICD-10-CM | POA: Diagnosis not present

## 2019-12-02 ENCOUNTER — Other Ambulatory Visit: Payer: Self-pay | Admitting: Cardiology

## 2019-12-27 NOTE — Progress Notes (Signed)
PTNS  Session # monthly  Health & Social Factors: no change Caffeine: 1 Alcohol: 0 Daytime voids #per day: 4-5 Night-time voids #per night: 0 Urgency: mild Incontinence Episodes #per day: 2 episodes of night time incontinence over the last month, unsure if due to taking Klonopin  Ankle used: left Treatment Setting: 2 Feeling/ Response: sensory Comments: Patient tolerated well  Preformed By: Elberta Leatherwood, CMA  Follow Up: 1 month

## 2019-12-28 ENCOUNTER — Ambulatory Visit (INDEPENDENT_AMBULATORY_CARE_PROVIDER_SITE_OTHER): Payer: Medicare Other | Admitting: Urology

## 2019-12-28 ENCOUNTER — Other Ambulatory Visit: Payer: Self-pay

## 2019-12-28 ENCOUNTER — Encounter: Payer: Self-pay | Admitting: Urology

## 2019-12-28 VITALS — BP 134/74 | HR 78 | Ht 62.0 in | Wt 154.0 lb

## 2019-12-28 DIAGNOSIS — N3941 Urge incontinence: Secondary | ICD-10-CM

## 2019-12-29 ENCOUNTER — Encounter: Payer: Self-pay | Admitting: Gastroenterology

## 2019-12-29 ENCOUNTER — Ambulatory Visit (INDEPENDENT_AMBULATORY_CARE_PROVIDER_SITE_OTHER): Payer: Medicare Other | Admitting: Gastroenterology

## 2019-12-29 DIAGNOSIS — Z1211 Encounter for screening for malignant neoplasm of colon: Secondary | ICD-10-CM | POA: Diagnosis not present

## 2019-12-29 MED ORDER — DICYCLOMINE HCL 10 MG PO CAPS: ORAL_CAPSULE | ORAL | 11 refills | Status: AC

## 2019-12-29 NOTE — Addendum Note (Signed)
Addended by: Danie Binder on: 12/29/2019 03:53 PM   Modules accepted: Level of Service

## 2019-12-29 NOTE — Patient Instructions (Addendum)
BULK UP STOOL SO YOU CAN HAVE A MORE SATISFACTORY BOWEL MOVEMENTS.  TAKE DICYCLOMINE 10 MG TABLETS ONE OR TWO 30 MINUTES PRIOR TO BREAKFAST AND LUNCH. IT MAY CAUSE DROWSINESS, DRY EYES/MOUTH, BLURRY VISION, OR DIFFICULTY URINATING.  COMPLETE  COLONOSCOPY IN APR 2021.   FOLLOW UP IN 1 YEAR.

## 2019-12-29 NOTE — Progress Notes (Signed)
Subjective:    Patient ID: Megan Ingram, female    DOB: 02-18-1946, 74 y.o.   MRN: [id number]   Primary Care Physician:  [name], NP  Primary GI:  Barney Drain, MD   Patient Location: home   Provider Location: [facility] office   Reason for Visit: COLONOSCOPY/REFILL BENTYL   Persons present on the virtual encounter, with roles: patient, myself (provider), MARTINA BOOTH CMA (update meds/allergies)   Total time (minutes) spent on medical discussion:  15 MINUTES   Due to COVID-19, visit was VIA TELEPHONE VISIT DUE TO COVID 19. VISIT IS CONDUCTED VIRTUALLY AND WAS REQUESTED BY PATIENT.   Virtual Visit via TELEPHONE   I connected with Megan Ingram and verified that I am speaking with the correct person using two identifiers. I discussed the limitations, risks, security and privacy concerns of performing an evaluation and management service by telephone/video and the availability of in person appointments. I also discussed with the patient that there may be a patient responsible charge related to this service. The patient expressed understanding and agreed to proceed.   [name], NP  HPI IF HAS BACK PAIN OR HEADACHE VICODIN SLOWS DOWN GUT FOR A DAY. BMs: MULTIPLE TIMES A DAY WITH SMALL CALIBER(ALMONDS). STOPPED USING FIBER. EATS BELVITA BISCUITS. STOMACH HAS BEEN OK. PRILOSEC DOING GOOD WORK. ONCE Q4-6 WEEKS CHEWS ON A TUMS.  WEIGHT STABLE.  PT DENIES FEVER, CHILLS, HEMATOCHEZIA, HEMATEMESIS, nausea, vomiting, melena, diarrhea, CHEST PAIN, SHORTNESS OF BREATH, constipation, abdominal pain, OR problems swallowing.  Past Medical History:  Diagnosis Date  . Chest pain   . Complication of anesthesia   . Degenerative joint disease   . Depression   . Endometriosis   . Gastroesophageal reflux disease   . Genital herpes   . Migraines    Ceased at menopause  . Osteopenia   . PONV (postoperative nausea and vomiting)    Past Surgical History:  Procedure Laterality  Date  . ABDOMINAL HYSTERECTOMY     Single ovary remained in place.  . ABDOMINAL SURGERY    . BIOPSY N/A 04/26/2014   Procedure: GASTRIC BIOPSY;  Surgeon: Danie Binder, MD;  Location: [facility];  Service: Endoscopy;  Laterality: N/A;  . BUNIONECTOMY  2007   Bilateral  . CATARACT EXTRACTION  2012   bilateral; with lens implant  . [place]  . COLONOSCOPY  2000   incomplete due to pain  . COLONOSCOPY WITH PROPOFOL N/A 04/26/2014   Procedure: COLONOSCOPY WITH PROPOFOL;  Surgeon: Danie Binder, MD;  Location: [facility];  Service: Endoscopy;  Laterality: N/A;  in cecum at 0810 out at 0839 = 29 total minutes  . ESOPHAGOGASTRODUODENOSCOPY (EGD) WITH PROPOFOL N/A 04/26/2014   Procedure: ESOPHAGOGASTRODUODENOSCOPY (EGD) WITH PROPOFOL;  Surgeon: Danie Binder, MD;  Location: [facility];  Service: Endoscopy;  Laterality: N/A;  . EXPLORATORY LAPAROTOMY     ovarian ectopic pregnancy  . POLYPECTOMY N/A 04/26/2014   Procedure: POLYPECTOMY;  Surgeon: Danie Binder, MD;  Location: [facility];  Service: Endoscopy;  Laterality: N/A;  . ROTATOR CUFF REPAIR  2013   Left  . SALPINGOOPHORECTOMY  1971  . SAVORY DILATION N/A 04/26/2014   Procedure: SAVORY DILATION;  Surgeon: Danie Binder, MD;  Location: [facility];  Service: Endoscopy;  Laterality: N/A;  14/15/16  . SEPTOPLASTY  03/23/2016   Allergies  Allergen Reactions  . Bee Venom Anaphylaxis  . Fentanyl And Related Hives  . Versed [Midazolam] Hives   Current  Outpatient Medications  Medication Sig    . acyclovir (ZOVIRAX) 400 MG tablet Take 400 mg by mouth daily.    Marland Kitchen atorvastatin (LIPITOR) 20 MG tablet Take 20 mg by mouth 4 (four) times a week.    . Cholecalciferol (HM VITAMIN D3) 4000 units CAPS Take 1 capsule by mouth every Monday, Wednesday, and Friday.     . citalopram (CELEXA) 20 MG tablet Take 20 mg by mouth daily.    . clonazePAM (KLONOPIN) 0.5 MG tablet Take 1 tablet by mouth daily as needed for anxiety.     Marland Kitchen diltiazem (CARDIZEM CD) 120 MG 24 hr  capsule TAKE (1) CAPSULE BY MOUTH ONCE DAILY.    Marland Kitchen diltiazem (CARDIZEM) 30 MG tablet Take 1 tablet (30 mg total) by mouth daily as needed (for palpitations).    Marland Kitchen estradiol (ESTRACE VAGINAL) 0.1 MG/GM vaginal cream Apply 0.5mg  (pea-sized amount)  just inside the vaginal introitus with a finger-tip on Monday, Wednesday and Friday nights.    Marland Kitchen HYDROcodone-acetaminophen (NORCO/VICODIN) 5-325 MG tablet Take 1 tablet by mouth every 6 (six) hours as needed for moderate pain.    . Multiple Vitamin (MULTIVITAMIN WITH MINERALS) TABS tablet Take 1 tablet by mouth daily.    Marland Kitchen omeprazole (PRILOSEC) 40 MG capsule Take 40 mg by mouth daily.    . traZODone (DESYREL) 100 MG tablet Take 100 mg by mouth at bedtime.    . TURMERIC PO Take 1 tablet by mouth daily.      Review of Systems PER HPI OTHERWISE ALL SYSTEMS ARE NEGATIVE.    Objective:   Physical Exam  TELEPHONE VISIT DUE TO COVID 19, VISIT IS CONDUCTED VIRTUALLY AND WAS REQUESTED BY PATIENT.     Assessment & Plan:

## 2019-12-29 NOTE — Assessment & Plan Note (Signed)
NO WARNING SIGNS/SYMPTOMS. NO BRBPR OR MELENA.  COMPLETE  COLONOSCOPY IN APR 2021. DISCUSSED PROCEDURE, BENEFITS, & RISKS: < 1% chance of medication reaction, bleeding, perforation, ASPIRATION, or rupture of spleen/liver requiring surgery to fix it and missed polyps < 1 cm 10-20% of the time. FOLLOW UP IN 1 YEAR.

## 2019-12-30 ENCOUNTER — Other Ambulatory Visit: Payer: Self-pay

## 2019-12-30 ENCOUNTER — Telehealth: Payer: Self-pay

## 2019-12-30 DIAGNOSIS — Z86010 Personal history of colonic polyps: Secondary | ICD-10-CM

## 2019-12-30 MED ORDER — SUPREP BOWEL PREP KIT 17.5-3.13-1.6 GM/177ML PO SOLN: 1.0000 | ORAL | 0 refills | Status: DC

## 2019-12-30 NOTE — Telephone Encounter (Signed)
Called pt, TCS w/Propofol w/SLF scheduled for 04/04/20 at 12:00pm. Rx for prep sent to pharmacy. Orders entered.

## 2019-12-30 NOTE — Telephone Encounter (Signed)
Pre-op and COVID test 04/03/20. Appt letter mailed with procedure instructions.

## 2020-02-01 ENCOUNTER — Encounter: Payer: Self-pay | Admitting: Urology

## 2020-02-01 ENCOUNTER — Other Ambulatory Visit: Payer: Self-pay

## 2020-02-01 ENCOUNTER — Ambulatory Visit (INDEPENDENT_AMBULATORY_CARE_PROVIDER_SITE_OTHER): Payer: Medicare Other | Admitting: Urology

## 2020-02-01 DIAGNOSIS — R3129 Other microscopic hematuria: Secondary | ICD-10-CM

## 2020-02-01 DIAGNOSIS — N3941 Urge incontinence: Secondary | ICD-10-CM | POA: Diagnosis not present

## 2020-02-01 LAB — URINALYSIS, COMPLETE
Bilirubin, UA: NEGATIVE
Glucose, UA: NEGATIVE
Ketones, UA: NEGATIVE
Leukocytes,UA: NEGATIVE
Nitrite, UA: NEGATIVE
Protein,UA: NEGATIVE
Specific Gravity, UA: 1.02 (ref 1.005–1.030)
Urobilinogen, Ur: 0.2 mg/dL (ref 0.2–1.0)
pH, UA: 5.5 (ref 5.0–7.5)

## 2020-02-01 LAB — MICROSCOPIC EXAMINATION: Bacteria, UA: NONE SEEN

## 2020-02-01 NOTE — Progress Notes (Signed)
PTNS  Session # Monthly   Health & Social Factors: None Caffeine: 1 Alcohol: 0 Daytime voids #per day: 5 Night-time voids #per night: 0 Urgency: moderate Incontinence Episodes #per day: 0 Ankle used: Right Treatment Setting: 4 Feeling/ Response: Both Comments: Patient tolerated procedure well.    Performed By: Zara Council, PA-C  Assistant:  Kyra Manges, CMA  Follow Up: one month   Patient had noted an increase in urinary urgency and a malodorous urine over the last few days and asked that we take a urine for urinalysis.  Patient denies any modifying or aggravating factors.  Patient denies any gross hematuria, dysuria or suprapubic/flank pain.  Patient denies any fevers, chills, nausea or vomiting.   UA is yellow clear, specific gravity 1.020, 2+ blood, pH 5.5, 0-5 WBCs per high-power field, 3-10 RBCs high-power field and 0-10 epithelial cells per high-power field.  We discussed the implications of microscopic hematuria, such as a possible sign of a urinary tract infection, nephrolithiasis and/or genitourinary cancers.  I will send the urine for culture and if it returns positive for infection we will treat the infection with appropriate antibiotic and recheck her urine to ensure the microscopic hematuria clears when she returns for her PTNS next month.  If urine culture returns negative for infection, we will pursue the hematuria work-up with CT urogram and cystoscopy.

## 2020-02-04 LAB — CULTURE, URINE COMPREHENSIVE

## 2020-02-07 ENCOUNTER — Other Ambulatory Visit: Payer: Self-pay | Admitting: Family Medicine

## 2020-02-07 ENCOUNTER — Telehealth: Payer: Self-pay | Admitting: Family Medicine

## 2020-02-07 DIAGNOSIS — R3129 Other microscopic hematuria: Secondary | ICD-10-CM

## 2020-02-07 NOTE — Telephone Encounter (Signed)
-----   Message from Nori Riis, PA-C sent at 02/04/2020  4:55 PM EST ----- Please let Mrs. Kuriakose know that her urine culture was negative.  We should go ahead and get a CT Urogram and cystoscopy scheduled.

## 2020-02-07 NOTE — Telephone Encounter (Signed)
Patient notified and will return call to schedule Cysto and CT result appointment

## 2020-02-23 ENCOUNTER — Other Ambulatory Visit: Payer: Medicare Other | Admitting: Urology

## 2020-02-23 ENCOUNTER — Other Ambulatory Visit: Payer: Self-pay

## 2020-02-23 ENCOUNTER — Ambulatory Visit (HOSPITAL_COMMUNITY)
Admission: RE | Admit: 2020-02-23 | Discharge: 2020-02-23 | Disposition: A | Discharge status: Home / Self Care | Payer: Medicare Other | Source: Ambulatory Visit | Attending: Urology | Admitting: Urology

## 2020-02-23 DIAGNOSIS — R3129 Other microscopic hematuria: Secondary | ICD-10-CM | POA: Diagnosis present

## 2020-02-23 LAB — POCT I-STAT CREATININE: Creatinine, Ser: 0.8 mg/dL (ref 0.44–1.00)

## 2020-02-23 MED ORDER — IOHEXOL 300 MG/ML  SOLN
125.0000 mL | Freq: Once | INTRAMUSCULAR | Status: AC | PRN
  Administered 2020-02-23: 150 mL via INTRAVENOUS

## 2020-02-29 ENCOUNTER — Ambulatory Visit (INDEPENDENT_AMBULATORY_CARE_PROVIDER_SITE_OTHER): Payer: Medicare Other | Admitting: Urology

## 2020-02-29 ENCOUNTER — Other Ambulatory Visit: Payer: Self-pay

## 2020-02-29 DIAGNOSIS — N3941 Urge incontinence: Secondary | ICD-10-CM | POA: Diagnosis not present

## 2020-02-29 NOTE — Progress Notes (Signed)
PTNS  Session # Monthly Maintenance   Health & Social Factors:  Caffeine: 5 Alcohol: 0 Daytime voids #per day: 4-5 Night-time voids #per night: 0 Urgency: Mild Incontinence Episodes #per day: 0 Ankle used: Left  Treatment Setting: 4 Feeling/ Response: Sensory  Comments: Patient tolerated the procedure  Performed By: Thomas Hoff, PA-C  Follow Up: One month

## 2020-03-07 ENCOUNTER — Ambulatory Visit (INDEPENDENT_AMBULATORY_CARE_PROVIDER_SITE_OTHER): Payer: Medicare Other | Admitting: Urology

## 2020-03-07 ENCOUNTER — Other Ambulatory Visit: Payer: Self-pay

## 2020-03-07 VITALS — BP 137/84 | HR 70 | Ht 64.0 in | Wt 154.0 lb

## 2020-03-07 DIAGNOSIS — N3941 Urge incontinence: Secondary | ICD-10-CM | POA: Diagnosis not present

## 2020-03-07 DIAGNOSIS — R3129 Other microscopic hematuria: Secondary | ICD-10-CM

## 2020-03-07 NOTE — Progress Notes (Signed)
   03/07/20  CC:  Chief Complaint  Patient presents with  . Cysto    HPI: 74 yo F with microscopic hematuria/ OAB here today for cystoscopy.    CT urogram 02/23/20 negative.  When personally reviewed, bladder appears to have adhesion at right dome to adnexa.  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    She has improved with PTNS.  Blood pressure 137/84, pulse 70, height 5\' 4"  (1.626 m), weight 154 lb (69.9 kg). NED. A&Ox3.   No respiratory distress   Abd soft, NT, ND Normal external genitalia with patent urethral meatus  Cystoscopy Procedure Note  Patient identification was confirmed, informed consent was obtained, and patient was prepped using Betadine solution.  Lidocaine jelly was administered per urethral meatus.    Procedure: - Flexible cystoscope introduced, without any difficulty.   - Thorough search of the bladder revealed:    normal urethral meatus    normal urothelium    no stones    no ulcers     no tumors    no urethral polyps    no trabeculation  Slightly tethering of bladder appreciated towards right dome c/w CT scan finding.  - Ureteral orifices were normal in position and appearance.  Post-Procedure: - Patient tolerated the procedure well  Assessment/ Plan:  1. Microscopic hematuria S/p negative evaluation as above Incidental findings as above  2. Urge incontinence Continue maintenance PTNS - Urinalysis, Complete   Hollice Espy, MD

## 2020-03-08 LAB — URINALYSIS, COMPLETE
Bilirubin, UA: NEGATIVE
Glucose, UA: NEGATIVE
Ketones, UA: NEGATIVE
Nitrite, UA: NEGATIVE
Protein,UA: NEGATIVE
Specific Gravity, UA: <1.005 — ABNORMAL LOW (ref 1.005–1.030)
Urobilinogen, Ur: 0.2 mg/dL (ref 0.2–1.0)
pH, UA: 5.5 (ref 5.0–7.5)

## 2020-03-08 LAB — MICROSCOPIC EXAMINATION

## 2020-03-28 ENCOUNTER — Other Ambulatory Visit: Payer: Self-pay

## 2020-03-28 ENCOUNTER — Ambulatory Visit (INDEPENDENT_AMBULATORY_CARE_PROVIDER_SITE_OTHER): Payer: Medicare Other | Admitting: Urology

## 2020-03-28 DIAGNOSIS — N3941 Urge incontinence: Secondary | ICD-10-CM

## 2020-03-28 NOTE — Progress Notes (Signed)
PTNS  Session # Monthly maintenance  Health & Social Factors:  Caffeine: 2 Alcohol: 0 Daytime voids #per day: 6 Night-time voids #per night: 0 Urgency: Mild Incontinence Episodes #per day: 0 Ankle used: Left  Treatment Setting: 3 Feeling/ Response: Both Comments: Patient tolerated procedure well.   Performed By: Zara Council, PA-C  Follow Up: One month

## 2020-04-03 ENCOUNTER — Other Ambulatory Visit (HOSPITAL_COMMUNITY)
Admission: RE | Admit: 2020-04-03 | Discharge: 2020-04-03 | Disposition: A | Discharge status: Home / Self Care | Payer: Medicare Other | Source: Ambulatory Visit | Attending: Gastroenterology | Admitting: Gastroenterology

## 2020-04-03 ENCOUNTER — Other Ambulatory Visit (HOSPITAL_COMMUNITY): Payer: Medicare Other

## 2020-04-03 ENCOUNTER — Encounter (HOSPITAL_COMMUNITY)
Admission: RE | Admit: 2020-04-03 | Discharge: 2020-04-03 | Disposition: A | Discharge status: Home / Self Care | Payer: Medicare Other | Source: Ambulatory Visit | Attending: Gastroenterology | Admitting: Gastroenterology

## 2020-04-03 ENCOUNTER — Other Ambulatory Visit: Payer: Self-pay

## 2020-04-03 DIAGNOSIS — Z01812 Encounter for preprocedural laboratory examination: Secondary | ICD-10-CM | POA: Insufficient documentation

## 2020-04-03 DIAGNOSIS — Z20822 Contact with and (suspected) exposure to covid-19: Secondary | ICD-10-CM | POA: Diagnosis not present

## 2020-04-03 LAB — SARS CORONAVIRUS 2 (TAT 6-24 HRS): SARS Coronavirus 2: NEGATIVE

## 2020-04-04 ENCOUNTER — Ambulatory Visit (HOSPITAL_COMMUNITY): Payer: Medicare Other | Admitting: Anesthesiology

## 2020-04-04 ENCOUNTER — Encounter (HOSPITAL_COMMUNITY): Payer: Self-pay | Admitting: Gastroenterology

## 2020-04-04 ENCOUNTER — Encounter (HOSPITAL_COMMUNITY)
Admission: RE | Disposition: A | Discharge status: Home / Self Care | Payer: Self-pay | Source: Home / Self Care | Attending: Gastroenterology

## 2020-04-04 ENCOUNTER — Ambulatory Visit (HOSPITAL_COMMUNITY)
Admission: RE | Admit: 2020-04-04 | Discharge: 2020-04-04 | Disposition: A | Discharge status: Home / Self Care | Payer: Medicare Other | Attending: Gastroenterology | Admitting: Gastroenterology

## 2020-04-04 DIAGNOSIS — I080 Rheumatic disorders of both mitral and aortic valves: Secondary | ICD-10-CM | POA: Insufficient documentation

## 2020-04-04 DIAGNOSIS — Z86010 Personal history of colonic polyps: Secondary | ICD-10-CM

## 2020-04-04 DIAGNOSIS — K635 Polyp of colon: Secondary | ICD-10-CM

## 2020-04-04 DIAGNOSIS — K5730 Diverticulosis of large intestine without perforation or abscess without bleeding: Secondary | ICD-10-CM | POA: Insufficient documentation

## 2020-04-04 DIAGNOSIS — Z87891 Personal history of nicotine dependence: Secondary | ICD-10-CM | POA: Insufficient documentation

## 2020-04-04 DIAGNOSIS — Z823 Family history of stroke: Secondary | ICD-10-CM | POA: Diagnosis not present

## 2020-04-04 DIAGNOSIS — Z9842 Cataract extraction status, left eye: Secondary | ICD-10-CM | POA: Insufficient documentation

## 2020-04-04 DIAGNOSIS — K648 Other hemorrhoids: Secondary | ICD-10-CM | POA: Diagnosis not present

## 2020-04-04 DIAGNOSIS — K644 Residual hemorrhoidal skin tags: Secondary | ICD-10-CM | POA: Diagnosis not present

## 2020-04-04 DIAGNOSIS — Z8249 Family history of ischemic heart disease and other diseases of the circulatory system: Secondary | ICD-10-CM | POA: Diagnosis not present

## 2020-04-04 DIAGNOSIS — Z961 Presence of intraocular lens: Secondary | ICD-10-CM | POA: Insufficient documentation

## 2020-04-04 DIAGNOSIS — D124 Benign neoplasm of descending colon: Secondary | ICD-10-CM | POA: Insufficient documentation

## 2020-04-04 DIAGNOSIS — M8580 Other specified disorders of bone density and structure, unspecified site: Secondary | ICD-10-CM | POA: Diagnosis not present

## 2020-04-04 DIAGNOSIS — Z9841 Cataract extraction status, right eye: Secondary | ICD-10-CM | POA: Insufficient documentation

## 2020-04-04 DIAGNOSIS — D122 Benign neoplasm of ascending colon: Secondary | ICD-10-CM | POA: Diagnosis not present

## 2020-04-04 DIAGNOSIS — K219 Gastro-esophageal reflux disease without esophagitis: Secondary | ICD-10-CM | POA: Insufficient documentation

## 2020-04-04 DIAGNOSIS — Z803 Family history of malignant neoplasm of breast: Secondary | ICD-10-CM | POA: Diagnosis not present

## 2020-04-04 DIAGNOSIS — Z90710 Acquired absence of both cervix and uterus: Secondary | ICD-10-CM | POA: Insufficient documentation

## 2020-04-04 DIAGNOSIS — Z825 Family history of asthma and other chronic lower respiratory diseases: Secondary | ICD-10-CM | POA: Insufficient documentation

## 2020-04-04 DIAGNOSIS — D123 Benign neoplasm of transverse colon: Secondary | ICD-10-CM | POA: Diagnosis not present

## 2020-04-04 DIAGNOSIS — Z79899 Other long term (current) drug therapy: Secondary | ICD-10-CM | POA: Insufficient documentation

## 2020-04-04 DIAGNOSIS — F329 Major depressive disorder, single episode, unspecified: Secondary | ICD-10-CM | POA: Diagnosis not present

## 2020-04-04 DIAGNOSIS — Z888 Allergy status to other drugs, medicaments and biological substances status: Secondary | ICD-10-CM | POA: Insufficient documentation

## 2020-04-04 DIAGNOSIS — K621 Rectal polyp: Secondary | ICD-10-CM | POA: Insufficient documentation

## 2020-04-04 DIAGNOSIS — Q438 Other specified congenital malformations of intestine: Secondary | ICD-10-CM | POA: Insufficient documentation

## 2020-04-04 DIAGNOSIS — Z91030 Bee allergy status: Secondary | ICD-10-CM | POA: Diagnosis not present

## 2020-04-04 DIAGNOSIS — Z8759 Personal history of other complications of pregnancy, childbirth and the puerperium: Secondary | ICD-10-CM | POA: Insufficient documentation

## 2020-04-04 DIAGNOSIS — Z1211 Encounter for screening for malignant neoplasm of colon: Secondary | ICD-10-CM | POA: Insufficient documentation

## 2020-04-04 DIAGNOSIS — Z800 Family history of malignant neoplasm of digestive organs: Secondary | ICD-10-CM | POA: Insufficient documentation

## 2020-04-04 DIAGNOSIS — Z820 Family history of epilepsy and other diseases of the nervous system: Secondary | ICD-10-CM | POA: Insufficient documentation

## 2020-04-04 DIAGNOSIS — Z90721 Acquired absence of ovaries, unilateral: Secondary | ICD-10-CM | POA: Insufficient documentation

## 2020-04-04 HISTORY — PX: POLYPECTOMY: SHX5525

## 2020-04-04 HISTORY — PX: COLONOSCOPY WITH PROPOFOL: SHX5780

## 2020-04-04 SURGERY — COLONOSCOPY WITH PROPOFOL
Anesthesia: General

## 2020-04-04 MED ORDER — LACTATED RINGERS IV SOLN: INTRAVENOUS | Status: DC | PRN

## 2020-04-04 MED ORDER — KETAMINE HCL 50 MG/5ML IJ SOSY
PREFILLED_SYRINGE | INTRAMUSCULAR | Status: AC
  Filled 2020-04-04: qty 5

## 2020-04-04 MED ORDER — LACTATED RINGERS IV SOLN: Freq: Once | INTRAVENOUS | Status: AC

## 2020-04-04 MED ORDER — CHLORHEXIDINE GLUCONATE CLOTH 2 % EX PADS: 6.0000 | MEDICATED_PAD | Freq: Once | CUTANEOUS | Status: DC

## 2020-04-04 MED ORDER — GLYCOPYRROLATE 0.2 MG/ML IJ SOLN
INTRAMUSCULAR | Status: DC | PRN
  Administered 2020-04-04: .1 mg via INTRAVENOUS

## 2020-04-04 MED ORDER — PROPOFOL 500 MG/50ML IV EMUL
INTRAVENOUS | Status: DC | PRN
  Administered 2020-04-04: 125 ug/kg/min via INTRAVENOUS

## 2020-04-04 MED ORDER — PROPOFOL 10 MG/ML IV BOLUS
INTRAVENOUS | Status: DC | PRN
  Administered 2020-04-04: 20 mg via INTRAVENOUS

## 2020-04-04 MED ORDER — EPHEDRINE 5 MG/ML INJ
INTRAVENOUS | Status: AC
  Filled 2020-04-04: qty 10

## 2020-04-04 MED ORDER — STERILE WATER FOR IRRIGATION IR SOLN
Status: DC | PRN
  Administered 2020-04-04: 100 mL

## 2020-04-04 MED ORDER — LIDOCAINE HCL (CARDIAC) PF 100 MG/5ML IV SOSY
PREFILLED_SYRINGE | INTRAVENOUS | Status: DC | PRN
  Administered 2020-04-04: 50 mg via INTRATRACHEAL

## 2020-04-04 MED ORDER — KETAMINE HCL 10 MG/ML IJ SOLN
INTRAMUSCULAR | Status: DC | PRN
  Administered 2020-04-04: 10 mg via INTRAVENOUS
  Administered 2020-04-04: 20 mg via INTRAVENOUS

## 2020-04-04 MED ORDER — PHENYLEPHRINE 40 MCG/ML (10ML) SYRINGE FOR IV PUSH (FOR BLOOD PRESSURE SUPPORT)
PREFILLED_SYRINGE | INTRAVENOUS | Status: AC
  Filled 2020-04-04: qty 10

## 2020-04-04 MED ORDER — PHENYLEPHRINE 40 MCG/ML (10ML) SYRINGE FOR IV PUSH (FOR BLOOD PRESSURE SUPPORT)
PREFILLED_SYRINGE | INTRAVENOUS | Status: DC | PRN
  Administered 2020-04-04 (×4): 40 ug via INTRAVENOUS

## 2020-04-04 NOTE — Op Note (Signed)
[facility] Patient Name: Megan Ingram Procedure Date: 04/04/2020 11:23 AM MRN: [id number] Date of Birth: 1946-02-26 Attending MD: Barney Drain MD, MD CSN: [id number] Age: 74 Admit Type: Outpatient Procedure:                Colonoscopy WITH COLD SNARE POLYPECTOMY Indications:              Screening for colorectal malignant neoplasm Providers:                Barney Drain MD, MD, Rosina Lowenstein, RN, Nelma Rothman,                            Technician Referring MD:             [name] Medicines:                Propofol per Anesthesia Complications:            No immediate complications. Estimated Blood Loss:     Estimated blood loss was minimal. Procedure:                Pre-Anesthesia Assessment:                           - Prior to the procedure, a History and Physical                            was performed, and patient medications and                            allergies were reviewed. The patient's tolerance of                            previous anesthesia was also reviewed. The risks                            and benefits of the procedure and the sedation                            options and risks were discussed with the patient.                            All questions were answered, and informed consent                            was obtained. Prior Anticoagulants: The patient has                            taken no previous anticoagulant or antiplatelet                            agents. ASA Grade Assessment: II - A patient with                            mild systemic disease. After reviewing the risks  and benefits, the patient was deemed in                            satisfactory condition to undergo the procedure.                            After obtaining informed consent, the colonoscope                            was passed under direct vision. Throughout the                            procedure, the patient's blood pressure,  pulse, and                            oxygen saturations were monitored continuously. The                            [id number]) scope was introduced through                            the anus and advanced to the the cecum, identified                            by appendiceal orifice and ileocecal valve. The                            colonoscopy was technically difficult and complex                            due to a tortuous colon. Successful completion of                            the procedure was aided by straightening and                            shortening the scope to obtain bowel loop reduction                            and COLOWRAP. The patient tolerated the procedure                            well. The quality of the bowel preparation was                            excellent. The ileocecal valve, appendiceal                            orifice, and rectum were photographed. Scope In: 11:50:53 AM Scope Out: 12:25:33 PM Scope Withdrawal Time: 0 hours 28 minutes 54 seconds  Total Procedure Duration: 0 hours 34 minutes 40 seconds  Findings:      Six sessile polyps were found in the rectum, descending colon, hepatic       flexure  and ascending colon(3). The polyps were 3 to 6 mm in size. These       polyps were removed with a cold snare. Resection and retrieval were       complete.      Multiple small and large-mouthed diverticula were found in the       recto-sigmoid colon, sigmoid colon, descending colon and hepatic flexure.      External and internal hemorrhoids were found.      The recto-sigmoid colon, sigmoid colon and descending colon were       moderately tortuous. Impression:               - Six 3 to 6 mm polyps in the rectum, in the                            descending colon, at the hepatic flexure and in the                            ascending colon, removed with a cold snare.                            Resected and retrieved.                            - Diverticulosis in the recto-sigmoid colon, in the                            sigmoid colon, in the descending colon and at the                            hepatic flexure.                           - External and internal hemorrhoids.                           - Tortuous colon. Moderate Sedation:      Per Anesthesia Care Recommendation:           - Patient has a contact number available for                            emergencies. The signs and symptoms of potential                            delayed complications were discussed with the                            patient. Return to normal activities tomorrow.                            Written discharge instructions were provided to the                            patient.                           -  High fiber diet.                           - Continue present medications.                           - Await pathology results.                           - Repeat colonoscopy in 3 years for surveillance. Procedure Code(s):        --- Professional ---                           [id number], Colonoscopy, flexible; with removal of                            tumor(s), polyp(s), or other lesion(s) by snare                            technique Diagnosis Code(s):        --- Professional ---                           Z12.11, Encounter for screening for malignant                            neoplasm of colon                           K62.1, Rectal polyp                           K63.5, Polyp of colon                           K64.8, Other hemorrhoids                           K57.30, Diverticulosis of large intestine without                            perforation or abscess without bleeding                           Q43.8, Other specified congenital malformations of                            intestine CPT copyright 2019 American Medical Association. All rights reserved. The codes documented in this report are preliminary and upon coder review may  be  revised to meet current compliance requirements. Barney Drain, MD Barney Drain MD, MD 04/04/2020 12:43:32 PM This report has been signed electronically. Number of Addenda: 0

## 2020-04-04 NOTE — Transfer of Care (Signed)
Immediate Anesthesia Transfer of Care Note  Patient: Megan Ingram  Procedure(s) Performed: COLONOSCOPY WITH PROPOFOL (N/A ) POLYPECTOMY  Patient Location: PACU  Anesthesia Type:General  Level of Consciousness: awake  Airway & Oxygen Therapy: Patient Spontanous Breathing  Post-op Assessment: Report given to RN and Post -op Vital signs reviewed and stable  Post vital signs: Reviewed and stable  Last Vitals:  Vitals Value Taken Time  BP    Temp    Pulse    Resp    SpO2      Last Pain:  Vitals:   04/04/20 1142  TempSrc:   PainSc: 0-No pain         Complications: No apparent anesthesia complications

## 2020-04-04 NOTE — Anesthesia Postprocedure Evaluation (Signed)
Anesthesia Post Note  Patient: Megan Ingram  Procedure(s) Performed: COLONOSCOPY WITH PROPOFOL (N/A ) POLYPECTOMY  Patient location during evaluation: PACU Anesthesia Type: General Level of consciousness: awake and alert Pain management: pain level controlled Vital Signs Assessment: post-procedure vital signs reviewed and stable Respiratory status: spontaneous breathing Cardiovascular status: stable Postop Assessment: no apparent nausea or vomiting Anesthetic complications: no     Last Vitals:  Vitals:   04/04/20 1105  BP: 133/87  Pulse: 94  Resp: 17  Temp: 36.5 C  SpO2: 96%    Last Pain:  Vitals:   04/04/20 1142  TempSrc:   PainSc: 0-No pain                 Alexios Keown Hristova

## 2020-04-04 NOTE — Anesthesia Preprocedure Evaluation (Signed)
Anesthesia Evaluation  Patient identified by MRN, date of birth, ID band Patient awake    Reviewed: Allergy & Precautions, NPO status , Patient's Chart, lab work & pertinent test results  History of Anesthesia Complications (+) PONV and history of anesthetic complications  Airway Mallampati: II  TM Distance: >3 FB Neck ROM: Full    Dental  (+) Dental Advisory Given, Caps   Pulmonary former smoker,    Pulmonary exam normal breath sounds clear to auscultation       Cardiovascular Exercise Tolerance: Good hypertension, Pt. on medications Normal cardiovascular exam+ dysrhythmias (on cardizem, did not take medication this morning)  Rhythm:Regular Rate:Normal  25-Sep-2018 23:29:47 [facility] ROUTINE RECORD Sinus rhythm Atrial premature complexes Confirmed by Orpah Greek [phone]) on 09/26/2018 12:03:57 AM   Neuro/Psych  Headaches, PSYCHIATRIC DISORDERS Depression  Neuromuscular disease    GI/Hepatic Neg liver ROS, GERD  Medicated and Controlled,  Endo/Other  negative endocrine ROS  Renal/GU negative Renal ROS     Musculoskeletal  (+) Arthritis ,   Abdominal   Peds  Hematology negative hematology ROS (+)   Anesthesia Other Findings Left ventricle: The cavity size was normal. Systolic function was normal. The estimated ejection fraction was in the range of 55% to 60%. Wall motion was normal; there were no regional wall motion abnormalities. Left ventricular diastolic function parameters were normal.  - Aortic valve: Transvalvular velocity was within the normal range.  There was no stenosis. There was mild regurgitation.  Regurgitation pressure half-time: 542 ms.  - Aorta: Ascending aortic diameter: 38 mm (S).  - Ascending aorta: The ascending aorta was mildly dilated.  - Mitral valve: Transvalvular velocity was within the normal range.  There was no evidence for stenosis. There was mild  regurgitation.  - Left atrium: The atrium was severely dilated.  - Right ventricle: The cavity size was normal. Wall thickness was normal. Systolic function was normal.  - Tricuspid valve: There was trivial regurgitation.  - Pulmonary arteries: Systolic pressure was mildly increased. PA  peak pressure: 42 mm Hg (S).   Reproductive/Obstetrics                             Anesthesia Physical Anesthesia Plan  ASA: II  Anesthesia Plan: General   Post-op Pain Management:    Induction: Intravenous  PONV Risk Score and Plan: TIVA and Treatment may vary due to age or medical condition  Airway Management Planned: Nasal Cannula, Natural Airway and Simple Face Mask  Additional Equipment:   Intra-op Plan:   Post-operative Plan:   Informed Consent: I have reviewed the patients History and Physical, chart, labs and discussed the procedure including the risks, benefits and alternatives for the proposed anesthesia with the patient or authorized representative who has indicated his/her understanding and acceptance.     Dental advisory given  Plan Discussed with: CRNA and Surgeon  Anesthesia Plan Comments:         Anesthesia Quick Evaluation

## 2020-04-04 NOTE — Discharge Instructions (Signed)
You have small internal hemorrhoids and diverticulosis IN YOUR LEFT AND RIGHT COLON. YOU HAD SIX POLYPS REMOVED.    EAT TO LIVE AND THINK OF FOOD AS MEDICINE. 75% OF YOUR PLATE SHOULD BE FRUITS/VEGGIES.  To have LESS BOWEL IRREGULARITY/ABDOMINAL PAIN:      1. If you must eat bread, EAT EZEKIEL BREAD. IT IS IN THE FROZEN SECTION OF THE GROCERY STORE.    2. DRINK WATER WITH FRUIT OR CUCUMBER ADDED. YOUR URINE SHOULD BE LIGHT YELLOW. AVOID SODA, GATORADE, ENERGY DRINKS, OR DIET SODA.     3. AVOID HIGH FRUCTOSE CORN SYRUP AND CAFFEINE.     4. DO NOT chew SUGAR FREE GUM OR USE ARTIFICIAL SWEETENERS. IF NEEDED USE STEVIA AS A SWEETENER.    5. DO NOT EAT ENRICHED WHEAT FLOUR, PASTA, RICE, OR CEREAL.    6. ONLY EAT WILD CAUGHT SEAFOOD, GRASS FED BEEF OR CHICKEN, PORK FROM PASTURE RAISE PIGS, OR EGGS FROM PASTURE RAISED CHICKENS.    8. PRACTICE HATHA YOGA FOR 15-30 MINS 3 OR 4 TIMES A WEEK.    9. START TAKING A MULTIVITAMIN, VITAMIN B12, AND VITAMIN D3 2000 IU DAILY.    10.  ADD ALPHA LIPOIC ACID 500 MG TWICE DAILY WITH MEALS.   **NATURAL ANTI-INFLAMMATORY SUPPLEMENT THAT IS AN ALTERNATIVE TO IBUPROFEN OR NAPROXEN**   USE PREPARATION H FOUR TIMES  A DAY IF NEEDED TO RELIEVE RECTAL PAIN/PRESSURE/BLEEDING.   YOUR BIOPSY RESULTS WILL BE BACK IN 5 BUSINESS DAYS.  Next colonoscopy in 3 years.  Colonoscopy Care After Read the instructions outlined below and refer to this sheet in the next week. These discharge instructions provide you with general information on caring for yourself after you leave the hospital. While your treatment has been planned according to the most current medical practices available, unavoidable complications occasionally occur. If you have any problems or questions after discharge, call DR. FIELDS, [phone].  ACTIVITY  You may resume your regular activity, but move at a slower pace for the next 24 hours.   Take frequent rest periods for the next 24 hours.    Walking will help get rid of the air and reduce the bloated feeling in your belly (abdomen).   No driving for 24 hours (because of the medicine (anesthesia) used during the test).   You may shower.   Do not sign any important legal documents or operate any machinery for 24 hours (because of the anesthesia used during the test).    NUTRITION  Drink plenty of fluids.   You may resume your normal diet as instructed by your doctor.   Begin with a light meal and progress to your normal diet. Heavy or fried foods are harder to digest and may make you feel sick to your stomach (nauseated).   Avoid alcoholic beverages for 24 hours or as instructed.    MEDICATIONS  You may resume your normal medications.   WHAT YOU CAN EXPECT TODAY  Some feelings of bloating in the abdomen.   Passage of more gas than usual.   Spotting of blood in your stool or on the toilet paper  .  IF YOU HAD POLYPS REMOVED DURING THE COLONOSCOPY:  Eat a soft diet IF YOU HAVE NAUSEA, BLOATING, ABDOMINAL PAIN, OR VOMITING.    FINDING OUT THE RESULTS OF YOUR TEST Not all test results are available during your visit. DR. Oneida Alar WILL CALL YOU WITHIN 14 DAYS OF YOUR PROCEDUE WITH YOUR RESULTS. Do not assume everything is normal if you have  not heard from DR. FIELDS, CALL HER OFFICE AT [phone].  SEEK IMMEDIATE MEDICAL ATTENTION AND CALL THE OFFICE: [phone] IF:  You have more than a spotting of blood in your stool.   Your belly is swollen (abdominal distention).   You are nauseated or vomiting.   You have a temperature over 101F.   You have abdominal pain or discomfort that is severe or gets worse throughout the day. High-Fiber Diet A high-fiber diet changes your normal diet to include more whole grains, legumes, fruits, and vegetables. Changes in the diet involve replacing refined carbohydrates with unrefined foods. The calorie level of the diet is essentially unchanged. The Dietary Reference  Intake (recommended amount) for adult males is 38 grams per day. For adult females, it is 25 grams per day. Pregnant and lactating women should consume 28 grams of fiber per day. Fiber is the intact part of a plant that is not broken down during digestion. Functional fiber is fiber that has been isolated from the plant to provide a beneficial effect in the body.  PURPOSE  Increase stool bulk.   Ease and regulate bowel movements.   Lower cholesterol.   REDUCE RISK OF COLON CANCER  INDICATIONS THAT YOU NEED MORE FIBER  Constipation and hemorrhoids.   Uncomplicated diverticulosis (intestine condition) and irritable bowel syndrome.   Weight management.   As a protective measure against hardening of the arteries (atherosclerosis), diabetes, and cancer.   GUIDELINES FOR INCREASING FIBER IN THE DIET  Start adding fiber to the diet slowly. A gradual increase of about 5 more grams (2 servings of most fruits or vegetables) per day is best. Too rapid an increase in fiber may result in constipation, flatulence, and bloating.   Drink enough water and fluids to keep your urine clear or pale yellow. Water, juice, or caffeine-free drinks are recommended. Not drinking enough fluid may cause constipation.   Eat a variety of high-fiber foods rather than one type of fiber.   Try to increase your intake of fiber through using high-fiber foods rather than fiber pills or supplements that contain small amounts of fiber.   The goal is to change the types of food eaten. Do not supplement your present diet with high-fiber foods, but replace foods in your present diet.   Polyps, Colon  A polyp is extra tissue that grows inside your body. Colon polyps grow in the large intestine. The large intestine, also called the colon, is part of your digestive system. It is a long, hollow tube at the end of your digestive tract where your body makes and stores stool. Most polyps are not dangerous. They are benign. This  means they are not cancerous. But over time, some types of polyps can turn into cancer. Polyps that are smaller than a pea are usually not harmful. But larger polyps could someday become or may already be cancerous. To be safe, doctors remove all polyps and test them.   PREVENTION There is not one sure way to prevent polyps. You might be able to lower your risk of getting them if you:  Eat more fruits and vegetables and less fatty food.   Do not smoke.   Avoid alcohol.   Exercise every day.   Lose weight if you are overweight.   Eating more calcium and folate can also lower your risk of getting polyps. Some foods that are rich in calcium are milk, cheese, and broccoli. Some foods that are rich in folate are chickpeas, kidney beans, and spinach.  Diverticulosis Diverticulosis is a common condition that develops when small pouches (diverticula) form in the wall of the colon. The risk of diverticulosis increases with age. It happens more often in people who eat a low-fiber diet. Most individuals with diverticulosis have no symptoms. Those individuals with symptoms usually experience belly (abdominal) pain, constipation, or loose stools (diarrhea).  HOME CARE INSTRUCTIONS  Increase the amount of fiber in your diet as directed by your caregiver or dietician. This may reduce symptoms of diverticulosis.   Drink at least 6 to 8 glasses of water each day to prevent constipation.   Try not to strain when you have a bowel movement.   Avoiding nuts and seeds to prevent complications is NOT NECESSARY.   FOODS HAVING HIGH FIBER CONTENT INCLUDE:  Fruits. Apple, peach, pear, tangerine, raisins, prunes.   Vegetables. Brussels sprouts, asparagus, broccoli, cabbage, carrot, cauliflower, romaine lettuce, spinach, summer squash, tomato, winter squash, zucchini.   Starchy Vegetables. Baked beans, kidney beans, lima beans, split peas, lentils, potatoes (with skin).   SEEK IMMEDIATE MEDICAL CARE  IF:  You develop increasing pain or severe bloating.   You have an oral temperature above 101F.   You develop vomiting or bowel movements that are bloody or black.

## 2020-04-04 NOTE — H&P (Signed)
Primary Care Physician:  [name], NP Primary Gastroenterologist:  Dr. Oneida Alar  Pre-Procedure History & Physical: HPI:  Megan Ingram is a 74 y.o. female here for [place].  Past Medical History:  Diagnosis Date  . Chest pain   . Complication of anesthesia   . Degenerative joint disease   . Depression   . Endometriosis   . Gastroesophageal reflux disease   . Genital herpes   . Migraines    Ceased at menopause  . Osteopenia   . PONV (postoperative nausea and vomiting)     Past Surgical History:  Procedure Laterality Date  . ABDOMINAL HYSTERECTOMY     Single ovary remained in place.  . ABDOMINAL SURGERY    . BIOPSY N/A 04/26/2014   Procedure: GASTRIC BIOPSY;  Surgeon: Danie Binder, MD;  Location: [facility];  Service: Endoscopy;  Laterality: N/A;  . BUNIONECTOMY  2007   Bilateral  . CATARACT EXTRACTION  2012   bilateral; with lens implant  . [place]  . COLONOSCOPY  2000   incomplete due to pain  . COLONOSCOPY WITH PROPOFOL N/A 04/26/2014   Procedure: COLONOSCOPY WITH PROPOFOL;  Surgeon: Danie Binder, MD;  Location: [facility];  Service: Endoscopy;  Laterality: N/A;  in cecum at 0810 out at 0839 = 29 total minutes  . ESOPHAGOGASTRODUODENOSCOPY (EGD) WITH PROPOFOL N/A 04/26/2014   Procedure: ESOPHAGOGASTRODUODENOSCOPY (EGD) WITH PROPOFOL;  Surgeon: Danie Binder, MD;  Location: [facility];  Service: Endoscopy;  Laterality: N/A;  . EXPLORATORY LAPAROTOMY     ovarian ectopic pregnancy  . POLYPECTOMY N/A 04/26/2014   Procedure: POLYPECTOMY;  Surgeon: Danie Binder, MD;  Location: [facility];  Service: Endoscopy;  Laterality: N/A;  . ROTATOR CUFF REPAIR  2013   Left  . SALPINGOOPHORECTOMY  1971  . SAVORY DILATION N/A 04/26/2014   Procedure: SAVORY DILATION;  Surgeon: Danie Binder, MD;  Location: [facility];  Service: Endoscopy;  Laterality: N/A;  14/15/16  . SEPTOPLASTY  03/23/2016    Prior to Admission medications   Medication Sig Start Date End Date  Taking? Authorizing Provider  acyclovir (ZOVIRAX) 400 MG tablet Take 400 mg by mouth daily.   Yes [provider]  atorvastatin (LIPITOR) 20 MG tablet Take 20 mg by mouth 4 (four) times a week.   Yes [provider]  Cholecalciferol (HM VITAMIN D3) 4000 units CAPS Take 4,000 Units by mouth every Monday, Wednesday, and Friday.    Yes [provider]  citalopram (CELEXA) 20 MG tablet Take 20 mg by mouth at bedtime.    Yes [provider]  clonazePAM (KLONOPIN) 0.5 MG tablet Take 0.5 mg by mouth daily as needed for anxiety.  08/13/11  Yes [provider]  dicyclomine (BENTYL) 10 MG capsule 1 PO 30 MINUTES PRIOR TO BREAKFAST AND LUNCH Patient taking differently: Take 10 mg by mouth 2 (two) times daily before a meal.  12/29/19  Yes Habiba Treloar L, MD  diltiazem (CARDIZEM CD) 120 MG 24 hr capsule TAKE (1) CAPSULE BY MOUTH ONCE DAILY. Patient taking differently: Take 120 mg by mouth daily.  12/02/19  Yes Jerline Pain, MD  diltiazem (CARDIZEM) 30 MG tablet Take 1 tablet (30 mg total) by mouth daily as needed (for palpitations). 12/08/18  Yes Jerline Pain, MD  estradiol (ESTRACE VAGINAL) 0.1 MG/GM vaginal cream Apply 0.'5mg'$  (pea-sized amount)  just inside the vaginal introitus with a finger-tip on Monday, Wednesday and Friday nights. 06/16/18  Yes  Zara Council A, PA-C  HYDROcodone-acetaminophen (NORCO/VICODIN) 5-325 MG tablet Take 1 tablet by mouth every 6 (six) hours as needed for moderate pain.   Yes [provider]  Multiple Vitamin (MULTIVITAMIN WITH MINERALS) TABS tablet Take 1 tablet by mouth daily.   Yes [provider]  omeprazole (PRILOSEC) 40 MG capsule Take 40 mg by mouth daily.   Yes [provider]  traZODone (DESYREL) 100 MG tablet Take 100 mg by mouth at bedtime.   Yes [provider]  TURMERIC PO Take 1 tablet by mouth daily.    Yes [provider]  Na Sulfate-K Sulfate-Mg Sulf (SUPREP BOWEL PREP  KIT) 17.5-3.13-1.6 GM/177ML SOLN Take 1 kit by mouth as directed. 12/30/19   Danie Binder, MD    Allergies as of 12/30/2019 - Review Complete 12/29/2019  Allergen Reaction Noted  . Bee venom Anaphylaxis 04/13/2014  . Fentanyl and related Hives 01/27/2013  . Versed [midazolam] Hives 01/27/2013    Family History  Problem Relation Age of Onset  . COPD Mother   . Breast cancer Mother   . Valvular heart disease Mother        aortic valve replacement  . Macular degeneration Mother   . Parkinson's disease Father   . Stroke Paternal Grandfather   . Colon cancer Sister 72    Social History   Socioeconomic History  . Marital status: Single    Spouse name: Not on file  . Number of children: Not on file  . Years of education: Not on file  . Highest education level: Not on file  Occupational History  . 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 Smoking status: Former Smoker    Packs/day: 0.50    Years: 2.00    Pack years: 1.00    Types: Cigarettes    Start date: 01/27/1977    Quit date: 01/27/1979    Years since quitting: 41.2  . Smokeless tobacco: Never Used  Substance and Sexual Activity  . Alcohol use: Yes    Comment: Occasional, 1-2 glasses of wine a month  . Drug use: No  . Sexual activity: Yes    Birth control/protection: Surgical  Other Topics Concern  . Not on file  Social History Narrative  . Not on file   Social Determinants of Health   Financial Resource Strain:   . Difficulty of Paying Living Expenses:   Food Insecurity:   . Worried About Charity fundraiser in the Last Year:   . Arboriculturist in the Last Year:   Transportation Needs:   . Film/video editor (Medical):   Marland Kitchen Lack of Transportation (Non-Medical):   Physical Activity:   . Days of Exercise per Week:   . Minutes of Exercise per Session:   Stress:   . Feeling of Stress :   Social Connections:   . Frequency of Communication with Friends  and Family:   . Frequency of Social Gatherings with Friends and Family:   . Attends Religious Services:   . Active Member of Clubs or Organizations:   . Attends Archivist Meetings:   Marland Kitchen Marital Status:   Intimate Partner Violence:   . Fear of Current or Ex-Partner:   . Emotionally Abused:   Marland Kitchen Physically Abused:   . Sexually Abused:     Review of Systems: See HPI, otherwise negative ROS   Physical Exam: BP 133/87   Pulse 94  Temp 97.7 F (36.5 C) (Oral)   Resp 17   SpO2 96%  General:   Alert,  pleasant and cooperative in NAD Head:  Normocephalic and atraumatic. Neck:  Supple; Lungs:  Clear throughout to auscultation.    Heart:  Regular rate and rhythm. Abdomen:  Soft, nontender and nondistended. Normal bowel sounds, without guarding, and without rebound.   Neurologic:  Alert and  oriented x4;  grossly normal neurologically.  Impression/Plan:    SCREENING  Plan:  1. TCS TODAY DISCUSSED PROCEDURE, BENEFITS, & RISKS: < 1% chance of medication reaction, bleeding, perforation, ASPIRATION, or rupture of spleen/liver requiring surgery to fix it and missed polyps < 1 cm 10-20% of the time.

## 2020-04-05 LAB — SURGICAL PATHOLOGY

## 2020-04-11 ENCOUNTER — Other Ambulatory Visit: Payer: Self-pay | Admitting: Cardiology

## 2020-04-12 NOTE — Progress Notes (Signed)
Called verified name and dob. Notified pt of results Pt agreeable to colonoscopy in 3 years

## 2020-04-26 ENCOUNTER — Other Ambulatory Visit: Payer: Self-pay

## 2020-04-26 ENCOUNTER — Encounter: Payer: Self-pay | Admitting: Urology

## 2020-04-26 ENCOUNTER — Ambulatory Visit (INDEPENDENT_AMBULATORY_CARE_PROVIDER_SITE_OTHER): Payer: Medicare Other | Admitting: Urology

## 2020-04-26 DIAGNOSIS — N3941 Urge incontinence: Secondary | ICD-10-CM

## 2020-04-26 NOTE — Progress Notes (Signed)
PTNS  Session # Monthly  Health & Social Factors: no change Caffeine: 1 Alcohol: 0 Daytime voids #per day: 5 Night-time voids #per night: 0 Urgency: none Incontinence Episodes #per day:0 Ankle used: left Treatment Setting: 2 Feeling/ Response: sensory Comments: patient tolerated well  Preformed By: Edwin Dada, CMA  Assistant: Elberta Leatherwood, CMA   Follow Up: 1 month

## 2020-05-31 ENCOUNTER — Ambulatory Visit: Payer: Self-pay | Admitting: Urology

## 2020-05-31 ENCOUNTER — Encounter: Payer: Self-pay | Admitting: Urology

## 2020-06-07 ENCOUNTER — Other Ambulatory Visit: Payer: Self-pay | Admitting: Cardiology

## 2020-06-07 MED ORDER — DILTIAZEM HCL ER COATED BEADS 120 MG PO CP24: ORAL_CAPSULE | ORAL | 0 refills | Status: DC

## 2020-06-14 NOTE — Progress Notes (Signed)
PTNS  Session # monthly maintenance  Health & Social Factors: No change Caffeine: 0-1 Alcohol: 1 Daytime voids #per day: 5-6 Night-time voids #per night: 0 Urgency: mild Incontinence Episodes #per day: 0 Ankle used: Left Treatment Setting: 3 Feeling/ Response: Sensory  Comments: Patient tolerated the procedure  Performed By: Zara Council, PA-C  Follow Up: One month

## 2020-06-15 ENCOUNTER — Other Ambulatory Visit: Payer: Self-pay

## 2020-06-15 ENCOUNTER — Ambulatory Visit (INDEPENDENT_AMBULATORY_CARE_PROVIDER_SITE_OTHER): Payer: Medicare Other | Admitting: Urology

## 2020-06-15 DIAGNOSIS — N3941 Urge incontinence: Secondary | ICD-10-CM

## 2020-07-12 NOTE — Progress Notes (Signed)
PTNS  Session # Monthly  Health & Social Factors: no change Caffeine: 1 Alcohol: 0 Daytime voids #per day: 5 Night-time voids #per night: 0 Urgency: mild Incontinence Episodes #per day: 1 Ankle used: left Treatment Setting: 5 Feeling/ Response: both Comments: Patient tolerated well  Performed By: Kerman Passey, RMA  Follow Up:1 month

## 2020-07-13 ENCOUNTER — Ambulatory Visit (INDEPENDENT_AMBULATORY_CARE_PROVIDER_SITE_OTHER): Payer: Medicare Other | Admitting: Urology

## 2020-07-13 ENCOUNTER — Other Ambulatory Visit: Payer: Self-pay

## 2020-07-13 DIAGNOSIS — N3941 Urge incontinence: Secondary | ICD-10-CM | POA: Diagnosis not present

## 2020-08-04 ENCOUNTER — Telehealth: Payer: Self-pay | Admitting: Cardiology

## 2020-08-04 NOTE — Telephone Encounter (Addendum)
Pt sick starting last Friday.  Went to [place] ER horrible uncontrolled vomiting and diarrhea. Sent home. BP has been low for a week. Saw NP Tues, thought had UTI that was causing it. Pt not having any urinary symptoms.  Checked urine in [place] ER. Started on CIPRO, nothing grew.  Stopped.   Weak and light headed and really bad orthostatic bps. NP recommended her stopping diltiazem and follow up with cardiology.  None since Tues.  Trazadone stopped also. No chest pain.   Today is first day she has SBP > 100 standing up. HR 90-120, little SOB with activities. Drinking lots of fluids and lieraly eating sea salt. No chest pain or pressure  Fully vaccinated in March.  Covid tested today, to rule this out.  Rapid was negative at [facility] department.    Her dad used to faint a lot and he took florinef. Wondering for a long time if she has same problem. Has been having difficulty tolerating warmer temp.   Due for annual visit with Dr. Marlou Porch.  I"ve scheduled her with [facility] on 08/09/20.  Pt appreciative for assistance.  Will update if send out covid comes back positive.

## 2020-08-04 NOTE — Telephone Encounter (Signed)
New NMessage:    Pt  Had to stop her Diltiazem. Her blood pressure is running low.   Pt c/o BP issue: STAT if pt c/o blurred vision, one-sided weakness or slurred speech  1. What are your last 5 BP readings? Sitting 110/6- and standing up it is 80/60  2. Are you having any other symptoms (ex. Dizziness, headache, blurred vision, passed out)? Lightheaded and extremely weak  3. What is your BP issue? Blood pressure running low

## 2020-08-07 NOTE — Progress Notes (Signed)
CARDIOLOGY OFFICE NOTE  Date:  08/09/2020    Megan Ingram Date of Birth: 10-03-1946 Medical Record #[id number]  PCP:  [name], NP  Cardiologist:  [facility]  Chief Complaint  Patient presents with  . Dizziness    Work in visit - seen for Dr. Marlou Porch    History of Present Illness: Megan Ingram is a 74 y.o. female who presents today for a work in visit. Seen for Dr. Marlou Porch.   She has a history of symptomatic palpitations - seen here originally in October of 2019. PACs noted. She is a retired Therapist, sports. Noted that she has previously reported having an ectopic ovarian pregnancy rupture with bleeding and actually arrested briefly on the operating table. Was initially placed on Metoprolol - changed to prn Diltiazem. Echo normal. Other issues include depression, reported aortic atherosclerosis and HLD.   Last seen here a year ago by Dr. Marlou Porch and felt to be doing ok.   Phone call last week -  "Pt sick starting last Friday.  Went to [facility] horrible uncontrolled vomiting and diarrhea. Sent home.  BP has been low for a week.  Saw NP Tues, thought had UTI that was causing it. Pt not having any urinary symptoms.  Checked urine in [facility]. Started on CIPRO, nothing grew.  Stopped.   Weak and light headed and really bad orthostatic bps.  NP recommended her stopping diltiazem and follow up with cardiology.  None since Tues.  Trazadone stopped also. No chest pain. Today is first day she has SBP > 100 standing up. HR 90-120, little SOB with activities. Drinking lots of fluids and lieraly eating sea salt.  No chest pain or pressure. Fully vaccinated in March.  Covid tested today, to rule this out.  Rapid was negative at health department.  Her dad used to faint a lot and he took florinef.  Wondering for a long time if she has same problem.  Has been having difficulty tolerating warmer temp. Due for annual visit with Dr. Marlou Porch.  I"ve scheduled her with APP on 08/09/20.  Pt appreciative  for assistance.  Will update if send out covid comes back positive".  Thus added to my schedule for today.   Comes in today. Here alone.  Notes that she had been doing fine prior to the recent events. Was outside working at a birthday party on July 31st - it was a  very hot day- she was seeing "spots" and feeling bad.  EMS was there on site and they "got her cooled off". A week later - while driving - got immediately nauseated and vomiting - ?food poisoning - had been about 5 hours after eating lunch - but could not drive on home due to constant dry heaving - called 911 - BP initially high like 829'H systolic. She feels she was not able to give a good history to the ER doctor due to her heaving.  Was also incontinent of uncontrollable stool. Was told there was bacteria and blood in the urine - thus given antibiotics but she did not feel she had been "cleaned up" due to the incontinence of stool for an appropriate UA. Apparently has had this on prior UA with GYN. Has had repeat UA = again with bacteria and blood - but culture negative - she is to see GU next week - she has had prior cystoscopy. She has been lying around, eating salt, and trying to hydrate. She was able to take a  shower yesterday for the first time without being lightheaded. She has not had to use any prn CCB and stopped the long acting Diltiazem after this spell.  She was told by PCP to come here. She is now feeling better. BP has come up. She remains off the long acting Diltiazem. She notes that she really "craves" salt- to the point that she remembers drinking sea water - she adds salt even to [employer] soup. Her father was on Florinef for the last few years of his life. She wonders if she needs this too. She is planning on seeing Endocrine for this evaluation.   Past Medical History:  Diagnosis Date  . Chest pain   . Complication of anesthesia   . Degenerative joint disease   . Depression   . Endometriosis   . Gastroesophageal  reflux disease   . Genital herpes   . Migraines    Ceased at menopause  . Osteopenia   . PONV (postoperative nausea and vomiting)     Past Surgical History:  Procedure Laterality Date  . ABDOMINAL HYSTERECTOMY     Single ovary remained in place.  . ABDOMINAL SURGERY    . BIOPSY N/A 04/26/2014   Procedure: GASTRIC BIOPSY;  Surgeon: Danie Binder, MD;  Location: [facility];  Service: Endoscopy;  Laterality: N/A;  . BUNIONECTOMY  2007   Bilateral  . CATARACT EXTRACTION  2012   bilateral; with lens implant  . [place]  . COLONOSCOPY  2000   incomplete due to pain  . COLONOSCOPY WITH PROPOFOL N/A 04/26/2014   Procedure: COLONOSCOPY WITH PROPOFOL;  Surgeon: Danie Binder, MD;  Location: [facility];  Service: Endoscopy;  Laterality: N/A;  in cecum at 0810 out at 0839 = 29 total minutes  . COLONOSCOPY WITH PROPOFOL N/A 04/04/2020   Procedure: COLONOSCOPY WITH PROPOFOL;  Surgeon: Danie Binder, MD;  Location: [facility];  Service: Endoscopy;  Laterality: N/A;  12:00pm  . ESOPHAGOGASTRODUODENOSCOPY (EGD) WITH PROPOFOL N/A 04/26/2014   Procedure: ESOPHAGOGASTRODUODENOSCOPY (EGD) WITH PROPOFOL;  Surgeon: Danie Binder, MD;  Location: [facility];  Service: Endoscopy;  Laterality: N/A;  . EXPLORATORY LAPAROTOMY     ovarian ectopic pregnancy  . POLYPECTOMY N/A 04/26/2014   Procedure: POLYPECTOMY;  Surgeon: Danie Binder, MD;  Location: [facility];  Service: Endoscopy;  Laterality: N/A;  . POLYPECTOMY  04/04/2020   Procedure: POLYPECTOMY;  Surgeon: Danie Binder, MD;  [facility];  Service: Endoscopy;;  . ROTATOR CUFF REPAIR  2013   Left  . SALPINGOOPHORECTOMY  1971  . SAVORY DILATION N/A 04/26/2014   Procedure: SAVORY DILATION;  Surgeon: Danie Binder, MD;  Location: [facility];  Service: Endoscopy;  Laterality: N/A;  14/15/16  . SEPTOPLASTY  03/23/2016     Medications: Current Meds  Medication Sig  . acyclovir (ZOVIRAX) 400 MG tablet Take 400 mg by mouth daily.  Marland Kitchen atorvastatin  (LIPITOR) 20 MG tablet TAKE 1 TABLET BY MOUTH ONCE DAILY.  Marland Kitchen Cholecalciferol (HM VITAMIN D3) 4000 units CAPS Take 4,000 Units by mouth every Monday, Wednesday, and Friday.   . citalopram (CELEXA) 20 MG tablet Take 20 mg by mouth at bedtime.   . clonazePAM (KLONOPIN) 0.5 MG tablet Take 0.5 mg by mouth daily as needed for anxiety.   . dicyclomine (BENTYL) 10 MG capsule 1 PO 30 MINUTES PRIOR TO BREAKFAST AND LUNCH (Patient taking differently: Take 10 mg by mouth 2 (two) times daily before a meal. )  .  diltiazem (CARDIZEM) 30 MG tablet Take 1 tablet (30 mg total) by mouth daily as needed (for palpitations).  Marland Kitchen estradiol (ESTRACE VAGINAL) 0.1 MG/GM vaginal cream Apply 0.5mg  (pea-sized amount)  just inside the vaginal introitus with a finger-tip on Monday, Wednesday and Friday nights.  Marland Kitchen HYDROcodone-acetaminophen (NORCO/VICODIN) 5-325 MG tablet Take 1 tablet by mouth every 6 (six) hours as needed for moderate pain.  . Multiple Vitamin (MULTIVITAMIN WITH MINERALS) TABS tablet Take 1 tablet by mouth daily.  Marland Kitchen omeprazole (PRILOSEC) 40 MG capsule Take 40 mg by mouth daily.  . TURMERIC PO Take 1 tablet by mouth daily.      Allergies: Allergies  Allergen Reactions  . Bee Venom Anaphylaxis  . Fentanyl And Related Hives  . Versed [Midazolam] Hives    Social History: The patient  reports that she quit smoking about 41 years ago. Her smoking use included cigarettes. She started smoking about 43 years ago. She has a 1.00 pack-year smoking history. She has never used smokeless tobacco. She reports current alcohol use. She reports that she does not use drugs.   Family History: The patient's family history includes Breast cancer in her mother; COPD in her mother; Colon cancer (age of onset: 35) in her sister; Macular degeneration in her mother; Parkinson's disease in her father; Stroke in her paternal grandfather; Valvular heart disease in her mother.   Review of Systems: Please see the history of present  illness.   All other systems are reviewed and negative.   Physical Exam: VS:  BP 136/85 (BP Location: Right Arm, Cuff Size: Normal)   Pulse 74   Ht 5\' 4"  (1.626 m)   Wt 152 lb (68.9 kg)   BMI 26.09 kg/m  .  BMI Body mass index is 26.09 kg/m.  Wt Readings from Last 3 Encounters:  08/09/20 152 lb (68.9 kg)  03/07/20 154 lb (69.9 kg)  12/28/19 154 lb (69.9 kg)    BP lying is 136/85 with HR 72. BP sitting is 113/77 with HR 81 Standing BP is 111/71 with HR 88 ("little dizzy") Standing BP at 3 minutes is 112/70 with HR 96.    General: Alert and in no acute distress.   Cardiac: Regular rate and rhythm. No murmurs, rubs, or gallops. No edema.  Respiratory:  Lungs are clear to auscultation bilaterally with normal work of breathing.  GI: Soft and nontender.  MS: No deformity or atrophy. Gait and ROM intact.  Skin: Warm and dry. Color is normal.  Neuro:  Strength and sensation are intact and no gross focal deficits noted.  Psych: Alert, appropriate and with normal affect.   LABORATORY DATA:  EKG:  EKG is ordered today.  Personally reviewed by me. This demonstrates sinus rhythm - HR is 80.  Lab Results  Component Value Date   WBC 7.0 09/25/2018   HGB 11.3 (L) 09/25/2018   HCT 34.8 (L) 09/25/2018   PLT 222 09/25/2018   GLUCOSE 112 (H) 09/25/2018   ALT 12 09/06/2018   AST 22 09/06/2018   NA 141 09/25/2018   K 3.9 09/25/2018   CL 109 09/25/2018   CREATININE 0.80 02/23/2020   BUN 18 09/25/2018   CO2 25 09/25/2018   TSH 4.264 09/26/2018     BNP (last 3 results) No results for input(s): BNP in the last 8760 hours.  ProBNP (last 3 results) No results for input(s): PROBNP in the last 8760 hours.   Other Studies Reviewed Today:  ECHO 10/06/18:  - Left ventricle: The cavity  size was normal. Systolic function was normal. The estimated ejection fraction was in the range of 55% to 60%. Wall motion was normal; there were no regional wall motion abnormalities. Left  ventricular diastolic function parameters were normal. - Aortic valve: Transvalvular velocity was within the normal range. There was no stenosis. There was mild regurgitation. Regurgitation pressure half-time: 542 ms. - Aorta: Ascending aortic diameter: 38 mm (S). - Ascending aorta: The ascending aorta was mildly dilated. - Mitral valve: Transvalvular velocity was within the normal range. There was no evidence for stenosis. There was mild regurgitation. - Left atrium: The atrium was severely dilated. - Right ventricle: The cavity size was normal. Wall thickness was normal. Systolic function was normal. - Tricuspid valve: There was trivial regurgitation. - Pulmonary arteries: Systolic pressure was mildly increased. PA peak pressure: 29 mm Hg (S).    10/06/18 MONITOR:  Occasional PACs noted.  Brief paroxysmal atrial tachycardia noted.   No atrial fibrillation, no ventricular tachycardia.    ASSESSMENT & PLAN:    1. Episode of vomiting/diarrhea - ?food poisoning - ? UTI - can certainly see where this would result in orthostasis - most likely by dehydration. She is not orthostatic here today. She notes she has had prior CT scanning so concern for mesenteric ischemia seems low.   2. Palpitations/PAT - she is off her long acting Diltiazem - I think this is ok for now - she will use her short acting prn. If this becomes frequent it is ok for her to restart the long acting CCB and she will let us know.   3. Excess salt use - she is planning on seeing Endocrine  4. Hematuria - she has GU follow up.   5. Mild AI - no significant murmur noted.   6. Mild dilatation of the aorta - she is aware of this.   7. Aortic atherosclerosis - she is on statin therapy - labs by her PCP. Favor continued CV risk factor modification.   Current medicines are reviewed with the patient today.  The patient does not have concerns regarding medicines other than what has been noted  above.  The following changes have been made:  See above.  Labs/ tests ordered today include:    Orders Placed This Encounter  Procedures  . EKG 12-Lead     Disposition:   FU with Dr. Marlou Porch in 6 months. I have cancelled her OV next month with Mickel Baas - Ms. Mares does not feel like she needs this.   Patient is agreeable to this plan and will call if any problems develop in the interim.   SignedTruitt Merle, NP  08/09/2020 10:37 AM  [facility] [address] [place], [place]  [address] Phone: [phone] Fax: [phone]

## 2020-08-09 ENCOUNTER — Other Ambulatory Visit: Payer: Self-pay

## 2020-08-09 ENCOUNTER — Ambulatory Visit (INDEPENDENT_AMBULATORY_CARE_PROVIDER_SITE_OTHER): Payer: Medicare Other | Admitting: Nurse Practitioner

## 2020-08-09 ENCOUNTER — Encounter: Payer: Self-pay | Admitting: Nurse Practitioner

## 2020-08-09 VITALS — BP 136/85 | HR 74 | Ht 64.0 in | Wt 152.0 lb

## 2020-08-09 DIAGNOSIS — E782 Mixed hyperlipidemia: Secondary | ICD-10-CM | POA: Diagnosis not present

## 2020-08-09 DIAGNOSIS — R002 Palpitations: Secondary | ICD-10-CM

## 2020-08-09 DIAGNOSIS — I491 Atrial premature depolarization: Secondary | ICD-10-CM | POA: Diagnosis not present

## 2020-08-09 DIAGNOSIS — I700 Atherosclerosis of aorta: Secondary | ICD-10-CM | POA: Diagnosis not present

## 2020-08-09 NOTE — Patient Instructions (Addendum)
After Visit Summary:  We will be checking the following labs today - NONE   Medication Instructions:    Continue with your current medicines. Ok to stay off the long acting Diltiazem - let us know if you do restart it    If you need a refill on your cardiac medications before your next appointment, please call your pharmacy.     Testing/Procedures To Be Arranged:  N/A  Follow-Up:   See Dr. Marlou Porch in 6 months    At [facility], you and your health needs are our priority.  As part of our continuing mission to provide you with exceptional heart care, we have created designated Provider Care Teams.  These Care Teams include your primary Cardiologist (physician) and Advanced Practice Providers (APPs -  Physician Assistants and Nurse Practitioners) who all work together to provide you with the care you need, when you need it.  Special Instructions:  . Stay safe, wash your hands for at least 20 seconds and wear a mask when needed.  . It was good to talk with you today.    Call the [place] office at [phone] if you have any questions, problems or concerns.

## 2020-08-16 ENCOUNTER — Ambulatory Visit (INDEPENDENT_AMBULATORY_CARE_PROVIDER_SITE_OTHER): Payer: Medicare Other

## 2020-08-16 ENCOUNTER — Other Ambulatory Visit: Payer: Self-pay

## 2020-08-16 DIAGNOSIS — N3941 Urge incontinence: Secondary | ICD-10-CM | POA: Diagnosis not present

## 2020-08-16 NOTE — Progress Notes (Signed)
PTNS  Session # monthly  Health & Social Factors: patient having trouble with low BP seeing endo to address Caffeine: 0 Alcohol: 0 Daytime voids #per day: 4 Night-time voids #per night: 0 Urgency: none Incontinence Episodes #per day: 0 Ankle used: Right Treatment Setting: 5 Feeling/ Response: both Comments: patient tolerate well  Performed By: Fonnie Jarvis, CMA   Follow Up: 1 month

## 2020-08-17 ENCOUNTER — Telehealth: Payer: Self-pay | Admitting: Nurse Practitioner

## 2020-08-17 NOTE — Telephone Encounter (Signed)
Will send to  [facility].  In ov note stated pt is going to see endrocine does not say Cecille Rubin is going to refer pt.

## 2020-08-17 NOTE — Telephone Encounter (Signed)
Patient told me at her visit that she was referring herself to Endocrine.   I would go thru primary care if needed.   Megan Ingram

## 2020-08-17 NOTE — Telephone Encounter (Signed)
Will send to Truitt Merle NP's nurse.

## 2020-08-17 NOTE — Telephone Encounter (Signed)
Spoke with pt and she states it wasn't until her 3rd conversation with Endo that they mentioned they required a referral.  Advised pt to contact PCP to put referral in.  Pt appreciative for call.

## 2020-08-17 NOTE — Telephone Encounter (Signed)
Patient called and stated that she seen Truitt Merle on 08/16/20 and referred her to endocrinology to Dr. Cruzita Lederer. Dept needs referral sent to them.

## 2020-09-07 ENCOUNTER — Ambulatory Visit: Payer: Medicare Other | Admitting: Cardiology

## 2020-09-15 ENCOUNTER — Other Ambulatory Visit: Payer: Self-pay

## 2020-09-15 ENCOUNTER — Ambulatory Visit: Payer: Medicare Other

## 2020-09-26 ENCOUNTER — Other Ambulatory Visit: Payer: Self-pay | Admitting: Cardiology

## 2020-10-16 ENCOUNTER — Ambulatory Visit: Payer: Self-pay

## 2020-10-16 ENCOUNTER — Encounter: Payer: Self-pay | Admitting: Endocrinology

## 2020-10-16 ENCOUNTER — Other Ambulatory Visit: Payer: Self-pay

## 2020-10-16 ENCOUNTER — Ambulatory Visit (INDEPENDENT_AMBULATORY_CARE_PROVIDER_SITE_OTHER): Payer: Medicare Other | Admitting: Endocrinology

## 2020-10-16 DIAGNOSIS — I959 Hypotension, unspecified: Secondary | ICD-10-CM

## 2020-10-16 DIAGNOSIS — M75110 Incomplete rotator cuff tear or rupture of unspecified shoulder, not specified as traumatic: Secondary | ICD-10-CM | POA: Insufficient documentation

## 2020-10-16 DIAGNOSIS — M25519 Pain in unspecified shoulder: Secondary | ICD-10-CM | POA: Insufficient documentation

## 2020-10-16 LAB — BASIC METABOLIC PANEL
BUN: 12 mg/dL (ref 6–23)
CO2: 28 mEq/L (ref 19–32)
Calcium: 9.2 mg/dL (ref 8.4–10.5)
Chloride: 105 mEq/L (ref 96–112)
Creatinine, Ser: 0.83 mg/dL (ref 0.40–1.20)
GFR: 70.04 mL/min (ref >60.00)
Glucose, Bld: 82 mg/dL (ref 70–99)
Potassium: 4.4 mEq/L (ref 3.5–5.1)
Sodium: 141 mEq/L (ref 135–145)

## 2020-10-16 LAB — CORTISOL
Cortisol, Plasma: 21.4 ug/dL
Cortisol, Plasma: 5.7 ug/dL

## 2020-10-16 MED ORDER — COSYNTROPIN 0.25 MG IJ SOLR
0.2500 mg | Freq: Once | INTRAMUSCULAR | Status: AC
  Administered 2020-10-16: 0.25 mg via INTRAVENOUS

## 2020-10-16 NOTE — Patient Instructions (Addendum)
Blood tests are requested for you today.  We'll let you know about the results.   If these are normal, no hormonal cause for the blood pressure is found.

## 2020-10-16 NOTE — Progress Notes (Signed)
Subjective:    Patient ID: Megan Ingram, female    DOB: 02/06/1946, 74 y.o.   MRN: [id number]  HPI Pt is referred by Angelina Ok, NP, for hypotension.  Pt was noted to have low cortisol level in mid-2021.  She reported intermitt dizziness and sweating.  syst BP was noted to be 75.  She reports many years of salt craving.  no h/o brain injury.  No h/o cancer, thyroid problems, seizures, hypoglycemia, amyloidosis, tuberculosis, or diabetes.  she has never been on steroid therapy.  No h/o ketoconazole, rifampin, or dilantin.  She stopped diltiazem 2 mos ago.  She has h/o mild RAS.  Her father took florinef later in life, but exact reason is unknown. Past Medical History:  Diagnosis Date  . Chest pain   . Complication of anesthesia   . Degenerative joint disease   . Depression   . Endometriosis   . Gastroesophageal reflux disease   . Genital herpes   . Migraines    Ceased at menopause  . Osteopenia   . PONV (postoperative nausea and vomiting)     Past Surgical History:  Procedure Laterality Date  . ABDOMINAL HYSTERECTOMY     Single ovary remained in place.  . ABDOMINAL SURGERY    . BIOPSY N/A 04/26/2014   Procedure: GASTRIC BIOPSY;  Surgeon: Danie Binder, MD;  Location: [facility];  Service: Endoscopy;  Laterality: N/A;  . BUNIONECTOMY  2007   Bilateral  . CATARACT EXTRACTION  2012   bilateral; with lens implant  . [place]  . COLONOSCOPY  2000   incomplete due to pain  . COLONOSCOPY WITH PROPOFOL N/A 04/26/2014   Procedure: COLONOSCOPY WITH PROPOFOL;  Surgeon: Danie Binder, MD;  Location: [facility];  Service: Endoscopy;  Laterality: N/A;  in cecum at 0810 out at 0839 = 29 total minutes  . COLONOSCOPY WITH PROPOFOL N/A 04/04/2020   Procedure: COLONOSCOPY WITH PROPOFOL;  Surgeon: Danie Binder, MD;  Location[facility];  Service: Endoscopy;  Laterality: N/A;  12:00pm  . ESOPHAGOGASTRODUODENOSCOPY (EGD) WITH PROPOFOL N/A 04/26/2014   Procedure:  ESOPHAGOGASTRODUODENOSCOPY (EGD) WITH PROPOFOL;  Surgeon: Danie Binder, MD;  Location: [facility];  Service: Endoscopy;  Laterality: N/A;  . EXPLORATORY LAPAROTOMY     ovarian ectopic pregnancy  . POLYPECTOMY N/A 04/26/2014   Procedure: POLYPECTOMY;  Surgeon: Danie Binder, MD;  Location: [facility];  Service: Endoscopy;  Laterality: N/A;  . POLYPECTOMY  04/04/2020   Procedure: POLYPECTOMY;  Surgeon: Danie Binder, MD;  [facility];  Service: Endoscopy;;  . ROTATOR CUFF REPAIR  2013   Left  . SALPINGOOPHORECTOMY  1971  . SAVORY DILATION N/A 04/26/2014   Procedure: SAVORY DILATION;  Surgeon: Danie Binder, MD;  Location: [facility];  Service: Endoscopy;  Laterality: N/A;  14/15/16  . SEPTOPLASTY  03/23/2016    Social History   Socioeconomic History  . Marital status: Single    Spouse name: Not on file  . Number of children: Not on file  . Years of education: Not on file  . Highest education level: Not on file  Occupational History  . 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 Smoking status: Former Smoker    Packs/day: 0.50    Years: 2.00    Pack years: 1.00    Types: Cigarettes    Start date: 01/27/1977    Quit date: 01/27/1979  Years since quitting: 41.7  . Smokeless tobacco: Never Used  Vaping Use  . Vaping Use: Never used  Substance and Sexual Activity  . Alcohol use: Yes    Comment: Occasional, 1-2 glasses of wine a month  . Drug use: No  . Sexual activity: Yes    Birth control/protection: Surgical  Other Topics Concern  . Not on file  Social History Narrative  . Not on file   Social Determinants of Health   Financial Resource Strain:   . Difficulty of Paying Living Expenses: Not on file  Food Insecurity:   . Worried About Charity fundraiser in the Last Year: Not on file  . Ran Out of Food in the Last Year: Not on file  Transportation Needs:   . Lack of Transportation (Medical): Not on file  . Lack of  Transportation (Non-Medical): Not on file  Physical Activity:   . Days of Exercise per Week: Not on file  . Minutes of Exercise per Session: Not on file  Stress:   . Feeling of Stress : Not on file  Social Connections:   . Frequency of Communication with Friends and Family: Not on file  . Frequency of Social Gatherings with Friends and Family: Not on file  . Attends Religious Services: Not on file  . Active Member of Clubs or Organizations: Not on file  . Attends Archivist Meetings: Not on file  . Marital Status: Not on file  Intimate Partner Violence:   . Fear of Current or Ex-Partner: Not on file  . Emotionally Abused: Not on file  . Physically Abused: Not on file  . Sexually Abused: Not on file    Current Outpatient Medications on File Prior to Visit  Medication Sig Dispense Refill  . acyclovir (ZOVIRAX) 400 MG tablet Take 400 mg by mouth daily.    Marland Kitchen atorvastatin (LIPITOR) 20 MG tablet TAKE 1 TABLET BY MOUTH ONCE DAILY. 90 tablet 3  . Cholecalciferol (HM VITAMIN D3) 4000 units CAPS Take 4,000 Units by mouth every Monday, Wednesday, and Friday.     . citalopram (CELEXA) 20 MG tablet Take 20 mg by mouth at bedtime.     . clonazePAM (KLONOPIN) 0.5 MG tablet Take 0.5 mg by mouth daily as needed for anxiety.     . dicyclomine (BENTYL) 10 MG capsule 1 PO 30 MINUTES PRIOR TO BREAKFAST AND LUNCH (Patient taking differently: Take 10 mg by mouth 2 (two) times daily before a meal. ) 60 capsule 11  . estradiol (ESTRACE VAGINAL) 0.1 MG/GM vaginal cream Apply 0.5mg  (pea-sized amount)  just inside the vaginal introitus with a finger-tip on Monday, Wednesday and Friday nights. 30 g 12  . HYDROcodone-acetaminophen (NORCO/VICODIN) 5-325 MG tablet Take 1 tablet by mouth every 6 (six) hours as needed for moderate pain.    . Multiple Vitamin (MULTIVITAMIN WITH MINERALS) TABS tablet Take 1 tablet by mouth daily.    Marland Kitchen omeprazole (PRILOSEC) 40 MG capsule Take 40 mg by mouth daily.    . TURMERIC  PO Take 1 tablet by mouth daily.      No current facility-administered medications on file prior to visit.    Allergies  Allergen Reactions  . Bee Venom Anaphylaxis  . Fentanyl And Related Hives  . Versed [Midazolam] Hives    Family History  Problem Relation Age of Onset  . COPD Mother   . Breast cancer Mother   . Valvular heart disease Mother        aortic  valve replacement  . Macular degeneration Mother   . Parkinson's disease Father   . Stroke Paternal Grandfather   . Colon cancer Sister 58    BP 134/80   Pulse 96   Ht 5\' 4"  (1.626 m)   Wt 154 lb (69.9 kg)   SpO2 98%   BMI 26.43 kg/m    Review of Systems Denies weight loss, fatigue, n/v, abd pain, cold intolerance, vitiligo, and change in skin tone.      Objective:   Physical Exam VS: see vs page GEN: no distress HEAD: head: no deformity eyes: no periorbital swelling, no proptosis external nose and ears are normal NECK: supple, thyroid is not enlarged CHEST WALL: no deformity LUNGS: clear to auscultation CV: reg rate and rhythm, no murmur.  MUSCULOSKELETAL: gait is normal and steady EXTEMITIES: no deformity.  no leg edema NEURO:  cn 2-12 grossly intact.   readily moves all 4's.  sensation is intact to touch on all 4's SKIN:  Normal texture and temperature.  No rash or suspicious lesion is visible.   NODES:  None palpable at the neck PSYCH: alert, well-oriented.  Does not appear anxious nor depressed.    CT (2021): Normal adrenal glands.    Na+=140 TSH=2 ACTH stimulation test is done: baseline cortisol level=6 then Cosyntropin 250 mcg is given im 45 minutes later, cortisol level=21 (normal response)     Assessment & Plan:  Hypotension, new to me.  uncertain etiology and prognosis.  Adrenal insuff is excluded RAS, but history: check renin/aldo.   Patient Instructions  Blood tests are requested for you today.  We'll let you know about the results.   If these are normal, no hormonal cause for the  blood pressure is found.

## 2020-10-17 ENCOUNTER — Ambulatory Visit (INDEPENDENT_AMBULATORY_CARE_PROVIDER_SITE_OTHER): Payer: Medicare Other | Admitting: Family Medicine

## 2020-10-17 DIAGNOSIS — N3941 Urge incontinence: Secondary | ICD-10-CM | POA: Diagnosis not present

## 2020-10-17 NOTE — Progress Notes (Signed)
PTNS  Session # monthly  Health & Social Factors: no change Caffeine: 0 Alcohol: 0 Daytime voids #per day: 4 Night-time voids #per night: 0 Urgency: none Incontinence Episodes #per day: 0 Ankle used: right Treatment Setting: 3 Feeling/ Response: sensory Comments: patient tolerated well  Performed By: Elberta Leatherwood, CMA  Follow Up: 1 month

## 2020-10-22 ENCOUNTER — Inpatient Hospital Stay (HOSPITAL_COMMUNITY)
Admission: EM | Admit: 2020-10-22 | Discharge: 2020-10-25 | DRG: 312 | Disposition: A | Discharge status: Home / Self Care | Payer: Medicare Other | Attending: Internal Medicine | Admitting: Internal Medicine

## 2020-10-22 ENCOUNTER — Encounter (HOSPITAL_COMMUNITY): Payer: Self-pay | Admitting: Emergency Medicine

## 2020-10-22 ENCOUNTER — Other Ambulatory Visit: Payer: Self-pay

## 2020-10-22 ENCOUNTER — Emergency Department (HOSPITAL_COMMUNITY): Payer: Medicare Other

## 2020-10-22 DIAGNOSIS — K219 Gastro-esophageal reflux disease without esophagitis: Secondary | ICD-10-CM | POA: Diagnosis present

## 2020-10-22 DIAGNOSIS — G90A Postural orthostatic tachycardia syndrome (POTS): Secondary | ICD-10-CM | POA: Diagnosis present

## 2020-10-22 DIAGNOSIS — Z9841 Cataract extraction status, right eye: Secondary | ICD-10-CM

## 2020-10-22 DIAGNOSIS — Z888 Allergy status to other drugs, medicaments and biological substances status: Secondary | ICD-10-CM

## 2020-10-22 DIAGNOSIS — Z90710 Acquired absence of both cervix and uterus: Secondary | ICD-10-CM

## 2020-10-22 DIAGNOSIS — R112 Nausea with vomiting, unspecified: Secondary | ICD-10-CM | POA: Diagnosis present

## 2020-10-22 DIAGNOSIS — Z79899 Other long term (current) drug therapy: Secondary | ICD-10-CM

## 2020-10-22 DIAGNOSIS — Z961 Presence of intraocular lens: Secondary | ICD-10-CM | POA: Diagnosis present

## 2020-10-22 DIAGNOSIS — F32A Depression, unspecified: Secondary | ICD-10-CM | POA: Diagnosis present

## 2020-10-22 DIAGNOSIS — E869 Volume depletion, unspecified: Secondary | ICD-10-CM | POA: Diagnosis present

## 2020-10-22 DIAGNOSIS — E871 Hypo-osmolality and hyponatremia: Secondary | ICD-10-CM | POA: Diagnosis present

## 2020-10-22 DIAGNOSIS — Z9842 Cataract extraction status, left eye: Secondary | ICD-10-CM

## 2020-10-22 DIAGNOSIS — Z823 Family history of stroke: Secondary | ICD-10-CM

## 2020-10-22 DIAGNOSIS — Z8249 Family history of ischemic heart disease and other diseases of the circulatory system: Secondary | ICD-10-CM

## 2020-10-22 DIAGNOSIS — B009 Herpesviral infection, unspecified: Secondary | ICD-10-CM | POA: Diagnosis present

## 2020-10-22 DIAGNOSIS — Z91030 Bee allergy status: Secondary | ICD-10-CM

## 2020-10-22 DIAGNOSIS — Z885 Allergy status to narcotic agent status: Secondary | ICD-10-CM

## 2020-10-22 DIAGNOSIS — Z20822 Contact with and (suspected) exposure to covid-19: Secondary | ICD-10-CM | POA: Diagnosis present

## 2020-10-22 DIAGNOSIS — M8580 Other specified disorders of bone density and structure, unspecified site: Secondary | ICD-10-CM | POA: Diagnosis present

## 2020-10-22 DIAGNOSIS — Z820 Family history of epilepsy and other diseases of the nervous system: Secondary | ICD-10-CM

## 2020-10-22 DIAGNOSIS — I498 Other specified cardiac arrhythmias: Secondary | ICD-10-CM | POA: Diagnosis present

## 2020-10-22 DIAGNOSIS — Z800 Family history of malignant neoplasm of digestive organs: Secondary | ICD-10-CM

## 2020-10-22 DIAGNOSIS — R42 Dizziness and giddiness: Secondary | ICD-10-CM | POA: Diagnosis not present

## 2020-10-22 DIAGNOSIS — Z87891 Personal history of nicotine dependence: Secondary | ICD-10-CM

## 2020-10-22 DIAGNOSIS — I10 Essential (primary) hypertension: Secondary | ICD-10-CM | POA: Diagnosis present

## 2020-10-22 DIAGNOSIS — I951 Orthostatic hypotension: Principal | ICD-10-CM | POA: Diagnosis present

## 2020-10-22 DIAGNOSIS — Z803 Family history of malignant neoplasm of breast: Secondary | ICD-10-CM

## 2020-10-22 DIAGNOSIS — I351 Nonrheumatic aortic (valve) insufficiency: Secondary | ICD-10-CM | POA: Diagnosis present

## 2020-10-22 DIAGNOSIS — Z825 Family history of asthma and other chronic lower respiratory diseases: Secondary | ICD-10-CM

## 2020-10-22 LAB — COMPREHENSIVE METABOLIC PANEL
ALT: 15 U/L (ref 0–44)
AST: 25 U/L (ref 15–41)
Albumin: 4.2 g/dL (ref 3.5–5.0)
Alkaline Phosphatase: 91 U/L (ref 38–126)
Anion gap: 11 (ref 5–15)
BUN: 17 mg/dL (ref 8–23)
CO2: 19 mmol/L — ABNORMAL LOW (ref 22–32)
Calcium: 8.3 mg/dL — ABNORMAL LOW (ref 8.9–10.3)
Chloride: 91 mmol/L — ABNORMAL LOW (ref 98–111)
Creatinine, Ser: 0.99 mg/dL (ref 0.44–1.00)
GFR, Estimated: >60 mL/min (ref >60)
Glucose, Bld: 158 mg/dL — ABNORMAL HIGH (ref 70–99)
Potassium: 3.5 mmol/L (ref 3.5–5.1)
Sodium: 121 mmol/L — ABNORMAL LOW (ref 135–145)
Total Bilirubin: 0.4 mg/dL (ref 0.3–1.2)
Total Protein: 7.6 g/dL (ref 6.5–8.1)

## 2020-10-22 LAB — CBC WITH DIFFERENTIAL/PLATELET
Abs Immature Granulocytes: 0.03 10*3/uL (ref 0.00–0.07)
Basophils Absolute: 0.1 10*3/uL (ref 0.0–0.1)
Basophils Relative: 1 %
Eosinophils Absolute: 0.1 10*3/uL (ref 0.0–0.5)
Eosinophils Relative: 1 %
HCT: 38.7 % (ref 36.0–46.0)
Hemoglobin: 12.9 g/dL (ref 12.0–15.0)
Immature Granulocytes: 0 %
Lymphocytes Relative: 15 %
Lymphs Abs: 1 10*3/uL (ref 0.7–4.0)
MCH: 30.5 pg (ref 26.0–34.0)
MCHC: 33.3 g/dL (ref 30.0–36.0)
MCV: 91.5 fL (ref 80.0–100.0)
Monocytes Absolute: 0.5 10*3/uL (ref 0.1–1.0)
Monocytes Relative: 7 %
Neutro Abs: 5.3 10*3/uL (ref 1.7–7.7)
Neutrophils Relative %: 76 %
Platelets: 251 10*3/uL (ref 150–400)
RBC: 4.23 MIL/uL (ref 3.87–5.11)
RDW: 12.7 % (ref 11.5–15.5)
WBC: 7 10*3/uL (ref 4.0–10.5)
nRBC: 0 % (ref 0.0–0.2)

## 2020-10-22 LAB — RESPIRATORY PANEL BY RT PCR (FLU A&B, COVID)
Influenza A by PCR: NEGATIVE
Influenza B by PCR: NEGATIVE
SARS Coronavirus 2 by RT PCR: NEGATIVE

## 2020-10-22 LAB — URINALYSIS, ROUTINE W REFLEX MICROSCOPIC
Bilirubin Urine: NEGATIVE
Glucose, UA: NEGATIVE mg/dL
Hgb urine dipstick: NEGATIVE
Ketones, ur: 5 mg/dL — AB
Leukocytes,Ua: NEGATIVE
Nitrite: NEGATIVE
Protein, ur: NEGATIVE mg/dL
Specific Gravity, Urine: 1.009 (ref 1.005–1.030)
pH: 7 (ref 5.0–8.0)

## 2020-10-22 LAB — MAGNESIUM: Magnesium: 1.9 mg/dL (ref 1.7–2.4)

## 2020-10-22 LAB — LIPASE, BLOOD: Lipase: 27 U/L (ref 11–51)

## 2020-10-22 LAB — TSH: TSH: 2.594 u[IU]/mL (ref 0.350–4.500)

## 2020-10-22 MED ORDER — CYCLOSPORINE 0.05 % OP EMUL
1.0000 [drp] | Freq: Two times a day (BID) | OPHTHALMIC | Status: DC
  Administered 2020-10-23 – 2020-10-25 (×4): 1 [drp] via OPHTHALMIC
  Filled 2020-10-22 (×4): qty 1

## 2020-10-22 MED ORDER — CITALOPRAM HYDROBROMIDE 20 MG PO TABS
20.0000 mg | ORAL_TABLET | Freq: Every day | ORAL | Status: DC
  Administered 2020-10-22: 20 mg via ORAL
  Filled 2020-10-22: qty 1

## 2020-10-22 MED ORDER — ACYCLOVIR 800 MG PO TABS
400.0000 mg | ORAL_TABLET | Freq: Every day | ORAL | Status: DC
  Administered 2020-10-23 – 2020-10-25 (×3): 400 mg via ORAL
  Filled 2020-10-22 (×3): qty 1

## 2020-10-22 MED ORDER — PANTOPRAZOLE SODIUM 40 MG PO TBEC
40.0000 mg | DELAYED_RELEASE_TABLET | Freq: Every day | ORAL | Status: DC
  Administered 2020-10-23 – 2020-10-25 (×3): 40 mg via ORAL
  Filled 2020-10-22 (×3): qty 1

## 2020-10-22 MED ORDER — ONDANSETRON HCL 4 MG PO TABS: 4.0000 mg | ORAL_TABLET | Freq: Four times a day (QID) | ORAL | Status: DC | PRN

## 2020-10-22 MED ORDER — POTASSIUM CHLORIDE IN NACL 40-0.9 MEQ/L-% IV SOLN: INTRAVENOUS | Status: DC

## 2020-10-22 MED ORDER — ONDANSETRON HCL 4 MG/2ML IJ SOLN
4.0000 mg | Freq: Once | INTRAMUSCULAR | Status: AC
  Administered 2020-10-22: 4 mg via INTRAVENOUS
  Filled 2020-10-22: qty 2

## 2020-10-22 MED ORDER — ONDANSETRON HCL 4 MG/2ML IJ SOLN: 4.0000 mg | Freq: Once | INTRAMUSCULAR | Status: DC

## 2020-10-22 MED ORDER — MORPHINE SULFATE (PF) 4 MG/ML IV SOLN
4.0000 mg | Freq: Once | INTRAVENOUS | Status: AC
  Administered 2020-10-22: 4 mg via INTRAVENOUS
  Filled 2020-10-22: qty 1

## 2020-10-22 MED ORDER — ONDANSETRON HCL 4 MG/2ML IJ SOLN
4.0000 mg | Freq: Four times a day (QID) | INTRAMUSCULAR | Status: DC | PRN
  Administered 2020-10-23: 4 mg via INTRAVENOUS
  Filled 2020-10-22: qty 2

## 2020-10-22 MED ORDER — POLYETHYLENE GLYCOL 3350 17 G PO PACK: 17.0000 g | PACK | Freq: Every day | ORAL | Status: DC | PRN

## 2020-10-22 MED ORDER — ACETAMINOPHEN 650 MG RE SUPP: 650.0000 mg | Freq: Four times a day (QID) | RECTAL | Status: DC | PRN

## 2020-10-22 MED ORDER — CITALOPRAM HYDROBROMIDE 20 MG PO TABS: 20.0000 mg | ORAL_TABLET | Freq: Every day | ORAL | Status: DC

## 2020-10-22 MED ORDER — ATORVASTATIN CALCIUM 20 MG PO TABS
20.0000 mg | ORAL_TABLET | Freq: Every day | ORAL | Status: DC
  Administered 2020-10-23 – 2020-10-25 (×3): 20 mg via ORAL
  Filled 2020-10-22 (×3): qty 1

## 2020-10-22 MED ORDER — SODIUM CHLORIDE 0.9 % IV BOLUS
1000.0000 mL | Freq: Once | INTRAVENOUS | Status: AC
  Administered 2020-10-22: 1000 mL via INTRAVENOUS

## 2020-10-22 MED ORDER — ENOXAPARIN SODIUM 40 MG/0.4ML ~~LOC~~ SOLN
40.0000 mg | SUBCUTANEOUS | Status: DC
  Administered 2020-10-24: 40 mg via SUBCUTANEOUS
  Filled 2020-10-22: qty 0.4

## 2020-10-22 MED ORDER — POTASSIUM CHLORIDE IN NACL 40-0.9 MEQ/L-% IV SOLN
INTRAVENOUS | Status: DC
  Filled 2020-10-22 (×3): qty 1000

## 2020-10-22 MED ORDER — ACETAMINOPHEN 325 MG PO TABS
650.0000 mg | ORAL_TABLET | Freq: Four times a day (QID) | ORAL | Status: DC | PRN
  Administered 2020-10-22: 650 mg via ORAL
  Filled 2020-10-22: qty 2

## 2020-10-22 NOTE — ED Triage Notes (Signed)
Pt reports emesis and "an episode like this in early August." Pt reports upon standing she becomes sweaty and lightheaded then nauseas.

## 2020-10-22 NOTE — Plan of Care (Signed)

## 2020-10-22 NOTE — H&P (Addendum)
History and Physical    Megan Ingram MPN:[id number] DOB: 1946-12-12 DOA: 10/22/2020  PCP: [name], NP   Patient coming from: Home  I have personally briefly reviewed patient's old medical records in [place]  Chief Complaint: Dizziness, Vomiting  HPI: Megan Ingram is a 73 y.o. female with medical history significant for depression, hypertension, migraines. Patient presented to the ED with complaints of lightheadedness, feeling off balance over the past 3 days.  Symptoms starts when she gets up, and is subsequently followed by nausea, dry heaving, vomiting and then symptoms resolve for the day, and re-occur again the next day.  Symptoms can last anywhere from 3 hours to 8 hours. When she is lying down she does not have these symptoms.  She has not had any episodes of actually passing out or falling.  Patient also reports over the past 2 months, she would check her blood pressure and systolic will drop from 008Q systolic to 76P standing. She has some mild upper abdominal pain from multiple episodes of dry heaving and vomiting.  No loose stools.  Patient also reports she has always craved salt.  She denies ear problems- impaired hearing, ringing or fullness in her ears.  She reports similar occurrence in August of this year, at that time she had orthostatic hypotension, but her electrolytes were normal.  She had a CT of her abdomen which was unremarkable at that time.  She followed up with her out patient nurse practitioner, and subsequently cardiologist.  This was deemed not cardiac related.  She was subsequently referred to an endocrinologist which she saw last week- 10/25 , at that time she had no symptoms, her blood work was normal, cortisol level was checked was unremarkable.  Patient also reports her father is on Florinef, and she is worried her presentation may be related to why he is on Florinef.  ED Course: Temperature 97.9.  Heart rate 80s to 90s, blood pressure  systolic 950D to 326Z.. Sodium 121, down from 141 - 6 days ago.  Potassium 3.5.  Normal lipase 27.  EKG sinus rhythm without changes.  1 L bolus given.  Hospitalist to admit for hyponatremia and dizziness.  Review of Systems: As per HPI all other systems reviewed and negative.  Past Medical History:  Diagnosis Date  . Chest pain   . Complication of anesthesia   . Degenerative joint disease   . Depression   . Endometriosis   . Gastroesophageal reflux disease   . Genital herpes   . Migraines    Ceased at menopause  . Osteopenia   . PONV (postoperative nausea and vomiting)     Past Surgical History:  Procedure Laterality Date  . ABDOMINAL HYSTERECTOMY     Single ovary remained in place.  . ABDOMINAL SURGERY    . BIOPSY N/A 04/26/2014   Procedure: GASTRIC BIOPSY;  Surgeon: Danie Binder, MD;  Location: [facility];  Service: Endoscopy;  Laterality: N/A;  . BUNIONECTOMY  2007   Bilateral  . CATARACT EXTRACTION  2012   bilateral; with lens implant  . [place]  . COLONOSCOPY  2000   incomplete due to pain  . COLONOSCOPY WITH PROPOFOL N/A 04/26/2014   Procedure: COLONOSCOPY WITH PROPOFOL;  Surgeon: Danie Binder, MD;  Location: [facility];  Service: Endoscopy;  Laterality: N/A;  in cecum at 0810 out at 0839 = 29 total minutes  . COLONOSCOPY WITH PROPOFOL N/A 04/04/2020   Procedure: COLONOSCOPY WITH PROPOFOL;  Surgeon: Danie Binder, MD;  Location: [facility];  Service: Endoscopy;  Laterality: N/A;  12:00pm  . ESOPHAGOGASTRODUODENOSCOPY (EGD) WITH PROPOFOL N/A 04/26/2014   Procedure: ESOPHAGOGASTRODUODENOSCOPY (EGD) WITH PROPOFOL;  Surgeon: Danie Binder, MD;  Location: [facility];  Service: Endoscopy;  Laterality: N/A;  . EXPLORATORY LAPAROTOMY     ovarian ectopic pregnancy  . POLYPECTOMY N/A 04/26/2014   Procedure: POLYPECTOMY;  Surgeon: Danie Binder, MD;  Location: [facility];  Service: Endoscopy;  Laterality: N/A;  . POLYPECTOMY  04/04/2020   Procedure: POLYPECTOMY;  Surgeon:  Danie Binder, MD;  Location: [facility];  Service: Endoscopy;;  . ROTATOR CUFF REPAIR  2013   Left  . SALPINGOOPHORECTOMY  1971  . SAVORY DILATION N/A 04/26/2014   Procedure: SAVORY DILATION;  Surgeon: Danie Binder, MD;  Location: [facility];  Service: Endoscopy;  Laterality: N/A;  14/15/16  . SEPTOPLASTY  03/23/2016     reports that she quit smoking about 41 years ago. Her smoking use included cigarettes. She started smoking about 43 years ago. She has a 1.00 pack-year smoking history. She has never used smokeless tobacco. She reports current alcohol use. She reports that she does not use drugs.  Allergies  Allergen Reactions  . Bee Venom Anaphylaxis  . Fentanyl And Related Hives  . Versed [Midazolam] Hives    Family History  Problem Relation Age of Onset  . COPD Mother   . Breast cancer Mother   . Valvular heart disease Mother        aortic valve replacement  . Macular degeneration Mother   . Parkinson's disease Father   . Stroke Paternal Grandfather   . Colon cancer Sister 73    Prior to Admission medications   Medication Sig Start Date End Date Taking? Authorizing Provider  acyclovir (ZOVIRAX) 400 MG tablet Take 400 mg by mouth daily.    [provider]  atorvastatin (LIPITOR) 20 MG tablet TAKE 1 TABLET BY MOUTH ONCE DAILY. 09/26/20   Jerline Pain, MD  Cholecalciferol (HM VITAMIN D3) 4000 units CAPS Take 4,000 Units by mouth every Monday, Wednesday, and Friday.     [provider]  citalopram (CELEXA) 20 MG tablet Take 20 mg by mouth at bedtime.     [provider]  clonazePAM (KLONOPIN) 0.5 MG tablet Take 0.5 mg by mouth daily as needed for anxiety.  08/13/11   [provider]  dicyclomine (BENTYL) 10 MG capsule 1 PO 30 MINUTES PRIOR TO BREAKFAST AND LUNCH Patient taking differently: Take 10 mg by mouth 2 (two) times daily before a meal.  12/29/19   Fields, Marga Melnick, MD  estradiol (ESTRACE VAGINAL) 0.1 MG/GM vaginal cream Apply 0.5mg   (pea-sized amount)  just inside the vaginal introitus with a finger-tip on Monday, Wednesday and Friday nights. 06/16/18   Zara Council A, PA-C  HYDROcodone-acetaminophen (NORCO/VICODIN) 5-325 MG tablet Take 1 tablet by mouth every 6 (six) hours as needed for moderate pain.    [provider]  Multiple Vitamin (MULTIVITAMIN WITH MINERALS) TABS tablet Take 1 tablet by mouth daily.    [provider]  omeprazole (PRILOSEC) 40 MG capsule Take 40 mg by mouth daily.    [provider]  TURMERIC PO Take 1 tablet by mouth daily.     [provider]    Physical Exam: Vitals:   10/22/20 1500 10/22/20 1530 10/22/20 1603 10/22/20 1630  BP:   (!) 146/85 132/69  Pulse: 91 83 (!) 108 96  Resp: (!) 23 13 20 18   Temp:      TempSrc:      SpO2: 99% 96% 97% 97%    Constitutional: NAD, calm, comfortable Vitals:   10/22/20 1500 10/22/20 1530 10/22/20 1603 10/22/20 1630  BP:   (!) 146/85 132/69  Pulse: 91 83 (!) 108 96  Resp: (!) 23 13 20 18   Temp:      TempSrc:      SpO2: 99% 96% 97% 97%   Eyes: PERRL, lids and conjunctivae normal ENMT: Mucous membranes are moist. Neck: normal, supple, no masses, no thyromegaly Respiratory: clear to auscultation bilaterally, no wheezing, no crackles. Normal respiratory effort. No accessory muscle use.  Cardiovascular: Regular rate and rhythm, no murmurs / rubs / gallops. No extremity edema. 2+ pedal pulses.  Abdomen: no tenderness, no masses palpated. No hepatosplenomegaly. Bowel sounds positive.  Musculoskeletal: no clubbing / cyanosis. No joint deformity upper and lower extremities. Good ROM, no contractures. Normal muscle tone.  Skin: no rashes, lesions, ulcers. No induration Neurologic: 4/5 strength in all extremities.  No apparent cranial abnormality. Psychiatric: Normal judgment and insight. Alert and oriented x 3. Normal mood.   Labs on Admission: I have personally reviewed following labs and imaging  studies  CBC: Recent Labs  Lab 10/22/20 1259  WBC 7.0  NEUTROABS 5.3  HGB 12.9  HCT 38.7  MCV 91.5  PLT 297   Basic Metabolic Panel: Recent Labs  Lab 10/16/20 1334 10/22/20 1259  NA 141 121*  K 4.4 3.5  CL 105 91*  CO2 28 19*  GLUCOSE 82 158*  BUN 12 17  CREATININE 0.83 0.99  CALCIUM 9.2 8.3*   Liver Function Tests: Recent Labs  Lab 10/22/20 1259  AST 25  ALT 15  ALKPHOS 91  BILITOT 0.4  PROT 7.6  ALBUMIN 4.2   Recent Labs  Lab 10/22/20 1259  LIPASE 27   Urine analysis:    Component Value Date/Time   COLORURINE YELLOW 10/22/2020 [place] 10/22/2020 1255   APPEARANCEUR Clear 03/07/2020 1509   LABSPEC 1.009 10/22/2020 1255   PHURINE 7.0 10/22/2020 [place] 10/22/2020 [place] 10/22/2020 [place] 10/22/2020 1255   BILIRUBINUR Negative 03/07/2020 1509   KETONESUR 5 (A) 10/22/2020 1255   PROTEINUR NEGATIVE 10/22/2020 1255   UROBILINOGEN 0.2 01/28/2014 1311   NITRITE NEGATIVE 10/22/2020 1255   LEUKOCYTESUR NEGATIVE 10/22/2020 1255    Radiological Exams on Admission: CT Head Wo Contrast  Result Date: 10/22/2020 CLINICAL DATA:  Dizziness. EXAM: CT HEAD WITHOUT CONTRAST TECHNIQUE: Contiguous axial images were obtained from the base of the skull through the vertex without intravenous contrast. COMPARISON:  None. FINDINGS: Brain: The ventricles are normal in size and configuration. No extra-axial fluid collections are identified. The gray-Pala differentiation is maintained. No CT findings for acute hemispheric infarction or intracranial hemorrhage. No mass lesions. The brainstem and cerebellum are normal. Vascular: Scattered vascular calcifications. No hyperdense vessels or obvious aneurysm. Skull: No acute skull fracture.  No bone lesion. Sinuses/Orbits: The paranasal sinuses and mastoid air cells are clear. The globes are intact. Other: No scalp lesions, laceration or hematoma. IMPRESSION:  Normal head CT for age. Electronically Signed   By: Marijo Sanes M.D.   On: 10/22/2020 14:10    EKG: Independently reviewed.  Sinus rhythm, rate 88, QTc 446.  No significant ST or T wave abnormalities.  Assessment/Plan Principal Problem:   Hyponatremia Active Problems:   Gastroesophageal reflux disease  Depression   HTN (hypertension)   Acute hyponatremia- sodium 121, recently checked 6 days ago 141.  Likely hypovolemic hyponatremia.  Most likely the dizziness with associated vomiting and poor oral intake caused the hyponatremia.  Potassium 3.5. -1 L bolus given, continue normal saline +40 KCl x 100cc/hr x 1 day -Monitor BMP closely -Urine osmolality, urine sodium, serum osmolality. -Check TSH -Resume Celexa for now though this can cause hyponatremia, but at this time I doubt this is the etiology.  Dizziness -with associated vomiting.  Orthostatic vitals checked after 1 L bolus given in ED and are positive-with systolic blood pressure dropping from 136/86 heart rate-108, lying >>> 106/72 heart rate increasing to 124 standing.  She is not on antihypertensives.  Considering symptoms are positional, and intermittent I doubt CVA.  Evaluated by endocrinologist 10/25 serum cortisol level was unremarkable, renin aldosterone activity pending.  Patient reports her father is on Florinef. -Hydrate for now, consider rechecking orthostatics prior to discharge, may need to consider autonomic dysfunction as etiology for orthostatic hypotension if persistent despite fluids. -Clear liquid diet, advance as tolerated -As needed Zofran -Resume home omeprazole -Obtain echocardiogram  Hypertension-systolic 163W to 466Z when lying, but with positive orthostatic vitals.  Not on antihypertensives.  Depression- -resume Celexa for now.  Herpes- resume acyclovir.   DVT prophylaxis: Lovenox. Code Status: Full code. Family Communication: None at bedside Disposition Plan: ~ 1 - 2 days Consults called:   none Admission status: Observation, telemetry   Bethena Roys MD [facility]  10/22/2020, 7:15 PM

## 2020-10-22 NOTE — ED Provider Notes (Signed)
[facility] EMERGENCY DEPARTMENT Provider Note   CSN: [id number] Arrival date & time: 10/22/20  1227     History Chief Complaint  Patient presents with  . Emesis    Megan Ingram is a 74 y.o. female.  Megan Ingram is a 74 y.o. female with a histoyr of GERD, migraines, osteopenia, and depression, who presents for evaluation of nausea, vomiting and feeling off balance.  Patient states these episodes began on Wednesday afternoon when she suddenly started feeling extremely nauseous, she had to run to the bathroom.  She states that while walking she noted that she felt like she was drunk and like she was going to miss a step, denies room spinning sensation.  Symptoms seem to resolve on their own but then occurred again on Friday, Saturday and Sunday with persistent vomiting and retching that has caused abdominal soreness.  The soreness is generalized and she denies focal abdominal pain.  Denies any hematemesis.  Reports that she is primarily dry heaving at this point and has not been able to keep anything down today.  Continues to report sensation of balance issues.  Also reports feeling a bit lightheaded, but no syncope.  Has chronic migraines, these are unchanged, no visual changes, numbness weakness or tingling.  No chest pain or shortness of breath.  Reports that she had similar episodes in August, was seen in the ED and had reassuring lab work and CT abdomen pelvis at that time, was treated symptomatically and symptoms ultimately resolved but afterwards she was having orthostatic hypotension, saw her PCP and was referred to cardiology, did not find any cardiac etiology for symptoms.  She is also referred to endocrinology given long history of salt craving and now with hypotension, but has not received results after recent appointment on Monday.  She has not taken any medications to treat her symptoms prior to arrival and states she is feeling quite miserable.        Past Medical  History:  Diagnosis Date  . Chest pain   . Complication of anesthesia   . Degenerative joint disease   . Depression   . Endometriosis   . Gastroesophageal reflux disease   . Genital herpes   . Migraines    Ceased at menopause  . Osteopenia   . PONV (postoperative nausea and vomiting)     Patient Active Problem List   Diagnosis Date Noted  . Hyponatremia 10/22/2020  . Hypotension 10/16/2020  . Partial thickness rotator cuff tear 10/16/2020  . Shoulder joint pain 10/16/2020  . History of colonic polyps   . Abdominal pain, epigastric 08/07/2016  . Achilles tendonitis, bilateral 12/06/2015  . Dyspepsia 10/12/2015  . Colon cancer screening 03/23/2014  . Herniated disc 09/23/2013  . Achilles bursitis or tendinitis 09/23/2013  . Back pain 04/28/2013  . Facet joint disease of lumbosacral region 04/28/2013  . Sciatica neuralgia 04/28/2013  . History of diagnostic tests 01/29/2013  . Chest pain   . Gastroesophageal reflux disease   . Depression 09/16/2011  . Diverticulitis 09/16/2011  . Genital herpes 09/16/2011  . Hearing loss, sensorineural 09/16/2011  . HTN (hypertension) 09/16/2011  . Migraine headache 09/16/2011  . Osteopenia 09/16/2011  . Recurrent UTI 09/16/2011    Past Surgical History:  Procedure Laterality Date  . ABDOMINAL HYSTERECTOMY     Single ovary remained in place.  . ABDOMINAL SURGERY    . BIOPSY N/A 04/26/2014   Procedure: GASTRIC BIOPSY;  Surgeon: Danie Binder, MD;  Location: [facility];  Service: Endoscopy;  Laterality: N/A;  . BUNIONECTOMY  2007   Bilateral  . CATARACT EXTRACTION  2012   bilateral; with lens implant  . [place]  . COLONOSCOPY  2000   incomplete due to pain  . COLONOSCOPY WITH PROPOFOL N/A 04/26/2014   Procedure: COLONOSCOPY WITH PROPOFOL;  Surgeon: Danie Binder, MD;  Location: [facility];  Service: Endoscopy;  Laterality: N/A;  in cecum at 0810 out at 0839 = 29 total minutes  . COLONOSCOPY WITH PROPOFOL N/A 04/04/2020    Procedure: COLONOSCOPY WITH PROPOFOL;  Surgeon: Danie Binder, MD;  Location: [facility];  Service: Endoscopy;  Laterality: N/A;  12:00pm  . ESOPHAGOGASTRODUODENOSCOPY (EGD) WITH PROPOFOL N/A 04/26/2014   Procedure: ESOPHAGOGASTRODUODENOSCOPY (EGD) WITH PROPOFOL;  Surgeon: Danie Binder, MD;  Location: [facility];  Service: Endoscopy;  Laterality: N/A;  . EXPLORATORY LAPAROTOMY     ovarian ectopic pregnancy  . POLYPECTOMY N/A 04/26/2014   Procedure: POLYPECTOMY;  Surgeon: Danie Binder, MD;  Location: [facility];  Service: Endoscopy;  Laterality: N/A;  . POLYPECTOMY  04/04/2020   Procedure: POLYPECTOMY;  Surgeon: Danie Binder, MD;  Location: [facility];  Service: Endoscopy;;  . ROTATOR CUFF REPAIR  2013   Left  . SALPINGOOPHORECTOMY  1971  . SAVORY DILATION N/A 04/26/2014   Procedure: SAVORY DILATION;  Surgeon: Danie Binder, MD;  Location: [facility];  Service: Endoscopy;  Laterality: N/A;  14/15/16  . SEPTOPLASTY  03/23/2016     OB History   No obstetric history on file.     Family History  Problem Relation Age of Onset  . COPD Mother   . Breast cancer Mother   . Valvular heart disease Mother        aortic valve replacement  . Macular degeneration Mother   . Parkinson's disease Father   . Stroke Paternal Grandfather   . Colon cancer Sister 81    Social History   Tobacco Use  . Smoking status: Former Smoker    Packs/day: 0.50    Years: 2.00    Pack years: 1.00    Types: Cigarettes    Start date: 01/27/1977    Quit date: 01/27/1979    Years since quitting: 41.7  . Smokeless tobacco: Never Used  Vaping Use  . Vaping Use: Never used  Substance Use Topics  . Alcohol use: Yes    Comment: Occasional, 1-2 glasses of wine a month  . Drug use: No    Home Medications Prior to Admission medications   Medication Sig Start Date End Date Taking? Authorizing Provider  cycloSPORINE (RESTASIS) 0.05 % ophthalmic emulsion Place 1 drop into both eyes 2 (two) times daily.   Yes [provider]  acyclovir (ZOVIRAX) 400 MG tablet Take 400 mg by mouth daily.    [provider]  atorvastatin (LIPITOR) 20 MG tablet TAKE 1 TABLET BY MOUTH ONCE DAILY. 09/26/20   Jerline Pain, MD  Cholecalciferol (HM VITAMIN D3) 4000 units CAPS Take 4,000 Units by mouth every Monday, Wednesday, and Friday.     [provider]  citalopram (CELEXA) 20 MG tablet Take 20 mg by mouth at bedtime.     [provider]  dicyclomine (BENTYL) 10 MG capsule 1 PO 30 MINUTES PRIOR TO BREAKFAST AND LUNCH Patient taking differently: Take 10 mg by mouth 2 (two) times daily before a meal.  12/29/19   Fields, Marga Melnick, MD  estradiol (ESTRACE VAGINAL) 0.1 MG/GM vaginal cream Apply  0.5mg  (pea-sized amount)  just inside the vaginal introitus with a finger-tip on Monday, Wednesday and Friday nights. 06/16/18   Zara Council A, PA-C  Multiple Vitamin (MULTIVITAMIN WITH MINERALS) TABS tablet Take 1 tablet by mouth daily.    [provider]  omeprazole (PRILOSEC) 40 MG capsule Take 40 mg by mouth daily.    [provider]  TURMERIC PO Take 1 tablet by mouth daily.     [provider]    Allergies    Bee venom, Fentanyl and related, and Versed [midazolam]  Review of Systems   Review of Systems  Constitutional: Negative for chills and fever.  HENT: Negative.   Eyes: Negative for visual disturbance.  Respiratory: Negative for cough and shortness of breath.   Cardiovascular: Negative for chest pain.  Gastrointestinal: Positive for abdominal pain, nausea and vomiting. Negative for blood in stool, constipation and diarrhea.  Genitourinary: Negative for dysuria.  Musculoskeletal: Negative for arthralgias and myalgias.  Skin: Negative for color change and rash.  Neurological: Positive for dizziness and light-headedness. Negative for tremors, seizures, syncope, facial asymmetry, speech difficulty, weakness, numbness and headaches.    Physical Exam Updated Vital  Signs BP 121/71 (BP Location: Right Arm)   Pulse 72   Temp 97.9 F (36.6 C) (Oral)   Resp 18   SpO2 100%   Physical Exam Vitals and nursing note reviewed.  Constitutional:      General: She is not in acute distress.    Appearance: Normal appearance. She is well-developed. She is ill-appearing. She is not diaphoretic.     Comments: Alert, ill-appearing, actively dry heaving, no acute distress  HENT:     Head: Normocephalic and atraumatic.     Mouth/Throat:     Mouth: Mucous membranes are dry.     Pharynx: Oropharynx is clear.  Eyes:     General:        Right eye: No discharge.        Left eye: No discharge.     Pupils: Pupils are equal, round, and reactive to light.  Cardiovascular:     Rate and Rhythm: Regular rhythm. Tachycardia present.     Heart sounds: Normal heart sounds.     Comments: Mildly tachycardic, regular rhythm Pulmonary:     Effort: Pulmonary effort is normal. No respiratory distress.     Breath sounds: Normal breath sounds. No wheezing or rales.     Comments: Respirations equal and unlabored, patient able to speak in full sentences, lungs clear to auscultation bilaterally Abdominal:     General: Bowel sounds are normal. There is no distension.     Palpations: Abdomen is soft. There is no mass.     Tenderness: There is abdominal tenderness. There is no guarding.     Comments: Abdomen is soft, nondistended, bowel sounds present throughout, abdomen with some generalized tenderness throughout, no guarding or peritoneal signs  Musculoskeletal:        General: No deformity.     Cervical back: Neck supple.  Skin:    General: Skin is warm and dry.     Capillary Refill: Capillary refill takes less than 2 seconds.  Neurological:     Mental Status: She is alert.     Coordination: Coordination normal.     Comments: Speech is clear, able to follow commands CN III-XII intact Normal strength in upper and lower extremities bilaterally including dorsiflexion and plantar  flexion, strong and equal grip strength Sensation normal to light and sharp touch Moves extremities  without ataxia, coordination intact No pronator drift.  Psychiatric:        Mood and Affect: Mood normal.        Behavior: Behavior normal.     ED Results / Procedures / Treatments   Labs (all labs ordered are listed, but only abnormal results are displayed) Labs Reviewed  COMPREHENSIVE METABOLIC PANEL - Abnormal; Notable for the following components:      Result Value   Sodium 121 (*)    Chloride 91 (*)    CO2 19 (*)    Glucose, Bld 158 (*)    Calcium 8.3 (*)    All other components within normal limits  URINALYSIS, ROUTINE W REFLEX MICROSCOPIC - Abnormal; Notable for the following components:   Ketones, ur 5 (*)    All other components within normal limits  RESPIRATORY PANEL BY RT PCR (FLU A&B, COVID)  LIPASE, BLOOD  CBC WITH DIFFERENTIAL/PLATELET  SODIUM, URINE, RANDOM  OSMOLALITY  OSMOLALITY, URINE  TSH  BASIC METABOLIC PANEL  MAGNESIUM    EKG None  Radiology CT Head Wo Contrast  Result Date: 10/22/2020 CLINICAL DATA:  Dizziness. EXAM: CT HEAD WITHOUT CONTRAST TECHNIQUE: Contiguous axial images were obtained from the base of the skull through the vertex without intravenous contrast. COMPARISON:  None. FINDINGS: Brain: The ventricles are normal in size and configuration. No extra-axial fluid collections are identified. The gray-Nordmann differentiation is maintained. No CT findings for acute hemispheric infarction or intracranial hemorrhage. No mass lesions. The brainstem and cerebellum are normal. Vascular: Scattered vascular calcifications. No hyperdense vessels or obvious aneurysm. Skull: No acute skull fracture.  No bone lesion. Sinuses/Orbits: The paranasal sinuses and mastoid air cells are clear. The globes are intact. Other: No scalp lesions, laceration or hematoma. IMPRESSION: Normal head CT for age. Electronically Signed   By: Marijo Sanes M.D.   On: 10/22/2020  14:10    Procedures .Critical Care Performed by: Jacqlyn Larsen, PA-C Authorized by: Jacqlyn Larsen, PA-C   Critical care provider statement:    Critical care time (minutes):  45   Critical care was necessary to treat or prevent imminent or life-threatening deterioration of the following conditions:  Metabolic crisis (Hyponatremia)   Critical care was time spent personally by me on the following activities:  Discussions with consultants, evaluation of patient's response to treatment, examination of patient, ordering and performing treatments and interventions, ordering and review of laboratory studies, ordering and review of radiographic studies, pulse oximetry, re-evaluation of patient's condition, obtaining history from patient or surrogate and review of old charts   (including critical care time)  Medications Ordered in ED Medications  ondansetron (ZOFRAN) injection 4 mg (has no administration in time range)  0.9 % NaCl with KCl 40 mEq / L  infusion (has no administration in time range)  sodium chloride 0.9 % bolus 1,000 mL (0 mLs Intravenous Stopped 10/22/20 1518)  ondansetron (ZOFRAN) injection 4 mg (4 mg Intravenous Given 10/22/20 1400)  morphine 4 MG/ML injection 4 mg (4 mg Intravenous Given 10/22/20 1518)    ED Course  I have reviewed the triage vital signs and the nursing notes.  Pertinent labs & imaging results that were available during my care of the patient were reviewed by me and considered in my medical decision making (see chart for details).    MDM Rules/Calculators/A&P                          74 year old female presents with  persistent nausea, vomiting and dry heaving, also reports feeling off balance or like she is drunk.  Episodes have been occurring intermittently over the past 3 days.  History of similar symptoms in August but did not have dizziness and balance issues at this time.  Has still been ambulatory at home.  No prior history of stroke.  Had reassuring  evaluation with cardiology after episodes in August.  No meds prior to arrival.  Will check basic labs, EKG and head CT.  Patient may ultimately need MRI if dizziness and balance symptoms persist, but she does not have any focal deficits on neuro exam at this time.  Continues to have dry heaving and retching and appears quite uncomfortable will give IV fluids and antiemetics.  EKG shows sinus rhythm without concerning changes when compared to prior.  No leukocytosis and normal hemoglobin.  Notified by nursing of critical sodium of 121, labs from 6 days ago showed sodium of 141, this quite a precipitous drop.  Patient has hypochloremia as well, CO2 of 19, glucose of 158, no other significant electrolyte derangements, normal renal and liver function.  Patient's urinalysis is unremarkable.    Head CT is clear without acute intracranial abnormalities.    Patient has received 1 L fluid bolus, but given symptoms with hyponatremia will require hospital admission, depending on continued evaluation of symptoms may need MRI if balance symptoms persist.  This is not currently available at the hospital today, patient is outside of any window for acute intervention and I do not think this is needed emergently to date.  Will consult for medicine admission.    Case discussed with Dr. Denton Brick with [facility] hospitalist who will see and admit the patient.  Final Clinical Impression(s) / ED Diagnoses Final diagnoses:  Hyponatremia  Non-intractable vomiting with nausea, unspecified vomiting type  Dizziness    Rx / DC Orders ED Discharge Orders    None       Jacqlyn Larsen, [place] 10/26/20 0250    Isla Pence, MD 10/26/20 [phone]

## 2020-10-23 ENCOUNTER — Observation Stay (HOSPITAL_COMMUNITY): Payer: Medicare Other

## 2020-10-23 DIAGNOSIS — E871 Hypo-osmolality and hyponatremia: Secondary | ICD-10-CM | POA: Diagnosis present

## 2020-10-23 DIAGNOSIS — Z825 Family history of asthma and other chronic lower respiratory diseases: Secondary | ICD-10-CM | POA: Diagnosis not present

## 2020-10-23 DIAGNOSIS — I340 Nonrheumatic mitral (valve) insufficiency: Secondary | ICD-10-CM | POA: Diagnosis not present

## 2020-10-23 DIAGNOSIS — R42 Dizziness and giddiness: Secondary | ICD-10-CM | POA: Diagnosis present

## 2020-10-23 DIAGNOSIS — Z20822 Contact with and (suspected) exposure to covid-19: Secondary | ICD-10-CM | POA: Diagnosis present

## 2020-10-23 DIAGNOSIS — Z9842 Cataract extraction status, left eye: Secondary | ICD-10-CM | POA: Diagnosis not present

## 2020-10-23 DIAGNOSIS — I10 Essential (primary) hypertension: Secondary | ICD-10-CM

## 2020-10-23 DIAGNOSIS — Z820 Family history of epilepsy and other diseases of the nervous system: Secondary | ICD-10-CM | POA: Diagnosis not present

## 2020-10-23 DIAGNOSIS — Z90710 Acquired absence of both cervix and uterus: Secondary | ICD-10-CM | POA: Diagnosis not present

## 2020-10-23 DIAGNOSIS — Z87891 Personal history of nicotine dependence: Secondary | ICD-10-CM | POA: Diagnosis not present

## 2020-10-23 DIAGNOSIS — Z803 Family history of malignant neoplasm of breast: Secondary | ICD-10-CM | POA: Diagnosis not present

## 2020-10-23 DIAGNOSIS — I351 Nonrheumatic aortic (valve) insufficiency: Secondary | ICD-10-CM

## 2020-10-23 DIAGNOSIS — B009 Herpesviral infection, unspecified: Secondary | ICD-10-CM | POA: Diagnosis present

## 2020-10-23 DIAGNOSIS — K219 Gastro-esophageal reflux disease without esophagitis: Secondary | ICD-10-CM | POA: Diagnosis present

## 2020-10-23 DIAGNOSIS — Z888 Allergy status to other drugs, medicaments and biological substances status: Secondary | ICD-10-CM | POA: Diagnosis not present

## 2020-10-23 DIAGNOSIS — Z885 Allergy status to narcotic agent status: Secondary | ICD-10-CM | POA: Diagnosis not present

## 2020-10-23 DIAGNOSIS — F32A Depression, unspecified: Secondary | ICD-10-CM | POA: Diagnosis present

## 2020-10-23 DIAGNOSIS — Z8249 Family history of ischemic heart disease and other diseases of the circulatory system: Secondary | ICD-10-CM | POA: Diagnosis not present

## 2020-10-23 DIAGNOSIS — Z961 Presence of intraocular lens: Secondary | ICD-10-CM | POA: Diagnosis present

## 2020-10-23 DIAGNOSIS — M8580 Other specified disorders of bone density and structure, unspecified site: Secondary | ICD-10-CM | POA: Diagnosis present

## 2020-10-23 DIAGNOSIS — Z800 Family history of malignant neoplasm of digestive organs: Secondary | ICD-10-CM | POA: Diagnosis not present

## 2020-10-23 DIAGNOSIS — I951 Orthostatic hypotension: Secondary | ICD-10-CM | POA: Diagnosis present

## 2020-10-23 DIAGNOSIS — Z79899 Other long term (current) drug therapy: Secondary | ICD-10-CM | POA: Diagnosis not present

## 2020-10-23 DIAGNOSIS — E869 Volume depletion, unspecified: Secondary | ICD-10-CM | POA: Diagnosis present

## 2020-10-23 DIAGNOSIS — Z9841 Cataract extraction status, right eye: Secondary | ICD-10-CM | POA: Diagnosis not present

## 2020-10-23 DIAGNOSIS — Z91030 Bee allergy status: Secondary | ICD-10-CM | POA: Diagnosis not present

## 2020-10-23 DIAGNOSIS — Z823 Family history of stroke: Secondary | ICD-10-CM | POA: Diagnosis not present

## 2020-10-23 LAB — ECHOCARDIOGRAM COMPLETE
AV Mean grad: 2.8 mmHg
AV Peak grad: 5.1 mmHg
Ao pk vel: 1.13 m/s
Area-P 1/2: 2.64 cm2
Height: 62 in
MV M vel: 4.76 m/s
MV Peak grad: 90.6 mmHg
P 1/2 time: 356 msec
S' Lateral: 3.35 cm
Weight: 2380.97 oz

## 2020-10-23 LAB — BASIC METABOLIC PANEL
Anion gap: 6 (ref 5–15)
Anion gap: 8 (ref 5–15)
BUN: 15 mg/dL (ref 8–23)
BUN: 14 mg/dL (ref 8–23)
CO2: 24 mmol/L (ref 22–32)
CO2: 21 mmol/L — ABNORMAL LOW (ref 22–32)
Calcium: 8.3 mg/dL — ABNORMAL LOW (ref 8.9–10.3)
Calcium: 8 mg/dL — ABNORMAL LOW (ref 8.9–10.3)
Chloride: 109 mmol/L (ref 98–111)
Chloride: 109 mmol/L (ref 98–111)
Creatinine, Ser: 0.84 mg/dL (ref 0.44–1.00)
Creatinine, Ser: 0.8 mg/dL (ref 0.44–1.00)
GFR, Estimated: >60 mL/min (ref >60)
GFR, Estimated: >60 mL/min (ref >60)
Glucose, Bld: 90 mg/dL (ref 70–99)
Glucose, Bld: 91 mg/dL (ref 70–99)
Potassium: 4.3 mmol/L (ref 3.5–5.1)
Potassium: 4.2 mmol/L (ref 3.5–5.1)
Sodium: 139 mmol/L (ref 135–145)
Sodium: 138 mmol/L (ref 135–145)

## 2020-10-23 LAB — SODIUM, URINE, RANDOM: Sodium, Ur: 148 mmol/L

## 2020-10-23 LAB — ALDOSTERONE + RENIN ACTIVITY W/ RATIO
ALDO / PRA Ratio: 2.2 Ratio (ref 0.9–28.9)
Aldosterone: 2 ng/dL
Renin Activity: 0.92 ng/mL/h (ref 0.25–5.82)

## 2020-10-23 LAB — OSMOLALITY: Osmolality: 300 mosm/kg — ABNORMAL HIGH (ref 275–295)

## 2020-10-23 LAB — OSMOLALITY, URINE: Osmolality, Ur: 663 mOsm/kg (ref 300–900)

## 2020-10-23 MED ORDER — PROCHLORPERAZINE EDISYLATE 10 MG/2ML IJ SOLN
10.0000 mg | Freq: Four times a day (QID) | INTRAMUSCULAR | Status: DC | PRN
  Administered 2020-10-23 – 2020-10-24 (×2): 10 mg via INTRAVENOUS
  Filled 2020-10-23 (×3): qty 2

## 2020-10-23 MED ORDER — MECLIZINE HCL 12.5 MG PO TABS
25.0000 mg | ORAL_TABLET | Freq: Once | ORAL | Status: AC
  Administered 2020-10-23: 25 mg via ORAL
  Filled 2020-10-23: qty 2

## 2020-10-23 MED ORDER — FLUDROCORTISONE ACETATE 0.1 MG PO TABS
0.1000 mg | ORAL_TABLET | Freq: Every day | ORAL | Status: DC
  Administered 2020-10-23 – 2020-10-25 (×3): 0.1 mg via ORAL
  Filled 2020-10-23 (×3): qty 1

## 2020-10-23 MED ORDER — BUPROPION HCL ER (SR) 150 MG PO TB12
150.0000 mg | ORAL_TABLET | Freq: Every day | ORAL | Status: DC
  Administered 2020-10-23 – 2020-10-25 (×3): 150 mg via ORAL
  Filled 2020-10-23 (×3): qty 1

## 2020-10-23 NOTE — Progress Notes (Signed)
*  PRELIMINARY RESULTS* Echocardiogram 2D Echocardiogram has been performed.  Leavy Cella 10/23/2020, 9:20 AM

## 2020-10-23 NOTE — Care Management Obs Status (Signed)
[facility] NOTIFICATION   Patient Details  Name: Megan Ingram MRN: [id number] Date of Birth: 19-Sep-1946   Medicare Observation Status Notification Given:   (copy printed and placed on chart)    Tommy Medal 10/23/2020, 4:41 PM

## 2020-10-23 NOTE — Progress Notes (Signed)
PROGRESS NOTE    Megan Ingram  KGM:[id number] DOB: 1946-05-07 DOA: 10/22/2020 PCP: [name], NP    Brief Narrative:   Megan Ingram is a 74 y.o. female with medical history significant for depression, hypertension, migraines. Patient presented to the ED with complaints of lightheadedness, feeling off balance over the past 3 days.  Symptoms starts when she gets up, and is subsequently followed by nausea, dry heaving, vomiting and then symptoms resolve for the day, and re-occur again the next day.  Symptoms can last anywhere from 3 hours to 8 hours. When she is lying down she does not have these symptoms.  She has not had any episodes of actually passing out or falling.  Patient also reports over the past 2 months, she would check her blood pressure and systolic will drop from 644I systolic to 34V standing. She has some mild upper abdominal pain from multiple episodes of dry heaving and vomiting.  No loose stools.  Patient also reports she has always craved salt.  She denies ear problems- impaired hearing, ringing or fullness in her ears.  She reports similar occurrence in August of this year, at that time she had orthostatic hypotension, but her electrolytes were normal.  She had a CT of her abdomen which was unremarkable at that time.  She followed up with her out patient nurse practitioner, and subsequently cardiologist.  This was deemed not cardiac related.  She was subsequently referred to an endocrinologist which she saw last week- 10/25 , at that time she had no symptoms, her blood work was normal, cortisol level was checked was unremarkable.  Patient also reports her father is on Florinef, and she is worried her presentation may be related to why he is on Florinef.   Assessment & Plan:   Principal Problem:   Hyponatremia Active Problems:   Gastroesophageal reflux disease   Depression   HTN (hypertension)   Orthostatic hypotension   Acute hyponatremia -Unclear  etiology -Possibly related to vomiting and volume depletion -This has improved with IV fluids -Continue to monitor  Orthostatic hypotension -Patient describes recurrent episodes of dizziness, hypotension, nausea with dry heaves -She was checking her blood pressure at home and on standing it gone down in the 70s -The symptoms have been occurring for several months -She reports constantly craving/eating salt -When she eats excessive amounts of salt, her symptoms are usually controlled -Reports that her father had similar issues with hypotension and syncope and was on daily Florinef -Cortisol was recently checked and had appropriate response to ACTH -Despite receiving IV fluids in the hospital, she continues to have orthostasis -Clinically, she does not appear dehydrated -We will provide TED hose stockings -?POTS, will discuss with cardiology -We will give a trial of Florinef  Depression -Recently stopped taking Celexa -We will started on bupropion   DVT prophylaxis: enoxaparin (LOVENOX) injection 40 mg Start: 10/22/20 2245  Code Status: Full code Family Communication: Discussed with patient Disposition Plan: Status is: Inpatient  Remains inpatient appropriate because:Ongoing diagnostic testing needed not appropriate for outpatient work up   Dispo: The patient is from: Home              Anticipated d/c is to: Home              Anticipated d/c date is: 1 day              Patient currently is not medically stable to d/c.    Consultants:     Procedures:  Antimicrobials:       Subjective: Patient is feeling mildly improved today after receiving IV fluids, but is still having orthostasis on standing  Objective: Vitals:   10/22/20 2029 10/23/20 0047 10/23/20 0426 10/23/20 0904  BP: (!) 155/101 117/60 (!) 114/92 135/79  Pulse: 91 81 80 77  Resp: 18 18 18 20   Temp: 99.5 F (37.5 C) 98.9 F (37.2 C) 98.8 F (37.1 C)   TempSrc: Oral Oral Oral   SpO2: 100% 99%  98% 100%  Weight: 67.5 kg     Height: 5\' 2"  (1.575 m)       Intake/Output Summary (Last 24 hours) at 10/23/2020 2031 Last data filed at 10/23/2020 0338 Gross per 24 hour  Intake 494.15 ml  Output --  Net 494.15 ml   Filed Weights   10/22/20 2029  Weight: 67.5 kg    Examination:  General exam: Appears calm and comfortable  Respiratory system: Clear to auscultation. Respiratory effort normal. Cardiovascular system: S1 & S2 heard, RRR. No JVD, murmurs, rubs, gallops or clicks. No pedal edema. Gastrointestinal system: Abdomen is nondistended, soft and nontender. No organomegaly or masses felt. Normal bowel sounds heard. Central nervous system: Alert and oriented. No focal neurological deficits. Extremities: Symmetric 5 x 5 power. Skin: No rashes, lesions or ulcers Psychiatry: Judgement and insight appear normal. Mood & affect appropriate.     Data Reviewed: I have personally reviewed following labs and imaging studies  CBC: Recent Labs  Lab 10/22/20 1259  WBC 7.0  NEUTROABS 5.3  HGB 12.9  HCT 38.7  MCV 91.5  PLT 258   Basic Metabolic Panel: Recent Labs  Lab 10/22/20 1259 10/23/20 0208 10/23/20 0732  NA 121* 139 138  K 3.5 4.3 4.2  CL 91* 109 109  CO2 19* 24 21*  GLUCOSE 158* 90 91  BUN 17 15 14   CREATININE 0.99 0.84 0.80  CALCIUM 8.3* 8.3* 8.0*  MG 1.9  --   --    GFR: Estimated Creatinine Clearance: 56.5 mL/min (by C-G formula based on SCr of 0.8 mg/dL). Liver Function Tests: Recent Labs  Lab 10/22/20 1259  AST 25  ALT 15  ALKPHOS 91  BILITOT 0.4  PROT 7.6  ALBUMIN 4.2   Recent Labs  Lab 10/22/20 1259  LIPASE 27   No results for input(s): AMMONIA in the last 168 hours. Coagulation Profile: No results for input(s): INR, PROTIME in the last 168 hours. Cardiac Enzymes: No results for input(s): CKTOTAL, CKMB, CKMBINDEX, TROPONINI in the last 168 hours. BNP (last 3 results) No results for input(s): PROBNP in the last 8760 hours. HbA1C: No  results for input(s): HGBA1C in the last 72 hours. CBG: No results for input(s): GLUCAP in the last 168 hours. Lipid Profile: No results for input(s): CHOL, HDL, LDLCALC, TRIG, CHOLHDL, LDLDIRECT in the last 72 hours. Thyroid Function Tests: Recent Labs    10/22/20 1259  TSH 2.594   Anemia Panel: No results for input(s): VITAMINB12, FOLATE, FERRITIN, TIBC, IRON, RETICCTPCT in the last 72 hours. Sepsis Labs: No results for input(s): PROCALCITON, LATICACIDVEN in the last 168 hours.  Recent Results (from the past 240 hour(s))  Respiratory Panel by RT PCR (Flu A&B, Covid) - Nasopharyngeal Swab     Status: None   Collection Time: 10/22/20  2:28 PM   Specimen: Nasopharyngeal Swab  Result Value Ref Range Status   SARS Coronavirus 2 by RT PCR NEGATIVE NEGATIVE Final    Comment: (NOTE) SARS-CoV-2 target nucleic acids are NOT DETECTED.  The SARS-CoV-2 RNA is generally detectable in upper respiratoy specimens during the acute phase of infection. The lowest concentration of SARS-CoV-2 viral copies this assay can detect is 131 copies/mL. A negative result does not preclude SARS-Cov-2 infection and should not be used as the sole basis for treatment or other patient management decisions. A negative result may occur with  improper specimen collection/handling, submission of specimen other than nasopharyngeal swab, presence of viral mutation(s) within the areas targeted by this assay, and inadequate number of viral copies (<131 copies/mL). A negative result must be combined with clinical observations, patient history, and epidemiological information. The expected result is Negative.  Fact Sheet for Patients:  PinkCheek.be  Fact Sheet for Healthcare Providers:  GravelBags.it  This test is no t yet approved or cleared by the [place] FDA and  has been authorized for detection and/or diagnosis of SARS-CoV-2 by FDA under an  Emergency Use Authorization (EUA). This EUA will remain  in effect (meaning this test can be used) for the duration of the COVID-19 declaration under Section 564(b)(1) of the Act, 21 U.S.C. section 360bbb-3(b)(1), unless the authorization is terminated or revoked sooner.     Influenza A by PCR NEGATIVE NEGATIVE Final   Influenza B by PCR NEGATIVE NEGATIVE Final    Comment: (NOTE) The Xpert Xpress SARS-CoV-2/FLU/RSV assay is intended as an aid in  the diagnosis of influenza from Nasopharyngeal swab specimens and  should not be used as a sole basis for treatment. Nasal washings and  aspirates are unacceptable for Xpert Xpress SARS-CoV-2/FLU/RSV  testing.  Fact Sheet for Patients: PinkCheek.be  Fact Sheet for Healthcare Providers: GravelBags.it  This test is not yet approved or cleared by the [place] FDA and  has been authorized for detection and/or diagnosis of SARS-CoV-2 by  FDA under an Emergency Use Authorization (EUA). This EUA will remain  in effect (meaning this test can be used) for the duration of the  Covid-19 declaration under Section 564(b)(1) of the Act, 21  U.S.C. section 360bbb-3(b)(1), unless the authorization is  terminated or revoked. Performed at [facility], [address] [place], [place] [address]          Radiology Studies: CT Head Wo Contrast  Result Date: 10/22/2020 CLINICAL DATA:  Dizziness. EXAM: CT HEAD WITHOUT CONTRAST TECHNIQUE: Contiguous axial images were obtained from the base of the skull through the vertex without intravenous contrast. COMPARISON:  None. FINDINGS: Brain: The ventricles are normal in size and configuration. No extra-axial fluid collections are identified. The gray-Kent differentiation is maintained. No CT findings for acute hemispheric infarction or intracranial hemorrhage. No mass lesions. The brainstem and cerebellum are normal. Vascular: Scattered vascular  calcifications. No hyperdense vessels or obvious aneurysm. Skull: No acute skull fracture.  No bone lesion. Sinuses/Orbits: The paranasal sinuses and mastoid air cells are clear. The globes are intact. Other: No scalp lesions, laceration or hematoma. IMPRESSION: Normal head CT for age. Electronically Signed   By: Marijo Sanes M.D.   On: 10/22/2020 14:10   ECHOCARDIOGRAM COMPLETE  Result Date: 10/23/2020    ECHOCARDIOGRAM REPORT   Patient Name:   Jovanka A Brymer Date of Exam: 10/23/2020 Medical Rec #:  [id number]        Height:       62.0 in Accession #:    [id number]       Weight:       148.8 lb Date of Birth:  30-Nov-1946        BSA:  1.686 m Patient Age:    69 years         BP:           135/79 mmHg Patient Gender: F                HR:           77 bpm. Exam Location:  [facility] Procedure: 2D Echo Indications:    Postural dizziness with presyncope [[id number]  History:        Patient has prior history of Echocardiogram examinations, most                 recent 10/06/2018. Signs/Symptoms:Chest Pain; Risk                 Factors:Former Smoker and Hypertension. GERD.  Sonographer:    Leavy Cella RDCS (AE) Referring Phys: [place]  1. Left ventricular ejection fraction, by estimation, is 55 to 60%. The left ventricle has normal function. The left ventricle has no regional wall motion abnormalities. Left ventricular diastolic parameters were normal.  2. Right ventricular systolic function is normal. The right ventricular size is normal.  3. The mitral valve is normal in structure. Mild mitral valve regurgitation. No evidence of mitral stenosis.  4. The aortic valve is tricuspid. Aortic valve regurgitation is mild.  5. The inferior vena cava is normal in size with greater than 50% respiratory variability, suggesting right atrial pressure of 3 mmHg. FINDINGS  Left Ventricle: Left ventricular ejection fraction, by estimation, is 55 to 60%. The left ventricle has normal function.  The left ventricle has no regional wall motion abnormalities. The left ventricular internal cavity size was normal in size. There is  no left ventricular hypertrophy. Left ventricular diastolic parameters were normal. Right Ventricle: The right ventricular size is normal. No increase in right ventricular wall thickness. Right ventricular systolic function is normal. Left Atrium: Left atrial size was normal in size. Right Atrium: Right atrial size was normal in size. Pericardium: There is no evidence of pericardial effusion. Mitral Valve: The mitral valve is normal in structure. Mild mitral valve regurgitation. No evidence of mitral valve stenosis. Tricuspid Valve: The tricuspid valve is normal in structure. Tricuspid valve regurgitation is not demonstrated. No evidence of tricuspid stenosis. Aortic Valve: The aortic valve is tricuspid. Aortic valve regurgitation is mild. Aortic regurgitation PHT measures 356 msec. Aortic valve mean gradient measures 2.8 mmHg. Aortic valve peak gradient measures 5.1 mmHg. Pulmonic Valve: The pulmonic valve was not well visualized. Pulmonic valve regurgitation is not visualized. No evidence of pulmonic stenosis. Aorta: The aortic root is normal in size and structure. Pulmonary Artery: Indeterminant PASP, inadequate TR jet. Venous: The inferior vena cava is normal in size with greater than 50% respiratory variability, suggesting right atrial pressure of 3 mmHg. IAS/Shunts: No atrial level shunt detected by color flow Doppler.  LEFT VENTRICLE PLAX 2D LVIDd:         4.73 cm  Diastology LVIDs:         3.35 cm  LV e' medial:    9.68 cm/s LV PW:         1.12 cm  LV E/e' medial:  6.3 LV IVS:        0.98 cm  LV e' lateral:   9.57 cm/s LVOT diam:     2.00 cm  LV E/e' lateral: 6.4 LVOT Area:     3.14 cm  RIGHT VENTRICLE RV S prime:     13.50  cm/s TAPSE (M-mode): 2.4 cm LEFT ATRIUM             Index       RIGHT ATRIUM           Index LA diam:        3.80 cm 2.25 cm/m  RA Area:     12.30 cm LA  Vol (A2C):   42.1 ml 24.97 ml/m RA Volume:   30.90 ml  18.33 ml/m LA Vol (A4C):   30.1 ml 17.85 ml/m LA Biplane Vol: 36.9 ml 21.89 ml/m  AORTIC VALVE AV Vmax:      112.93 cm/s AV Vmean:     79.321 cm/s AV VTI:       0.240 m AV Peak Grad: 5.1 mmHg AV Mean Grad: 2.8 mmHg AI PHT:       356 msec  AORTA Ao Root diam: 3.10 cm MITRAL VALVE MV Area (PHT): 2.64 cm    SHUNTS MV Decel Time: 287 msec    Systemic Diam: 2.00 cm MR Peak grad: 90.6 mmHg MR Vmax:      476.00 cm/s MV E velocity: 61.30 cm/s MV A velocity: 53.60 cm/s MV E/A ratio:  1.14 Carlyle Dolly MD Electronically signed by Carlyle Dolly MD Signature Date/Time: 10/23/2020/11:21:58 AM    Final         Scheduled Meds:  acyclovir  400 mg Oral Daily   atorvastatin  20 mg Oral Daily   buPROPion  150 mg Oral Daily   cycloSPORINE  1 drop Both Eyes BID   enoxaparin (LOVENOX) injection  40 mg Subcutaneous Q24H   fludrocortisone  0.1 mg Oral Daily   pantoprazole  40 mg Oral Daily   Continuous Infusions:   LOS: 0 days    Time spent: 25mins    Kathie Dike, MD [facility]   If 7PM-7AM, please contact night-coverage [email or web address]  10/23/2020, 8:31 PM

## 2020-10-23 NOTE — Progress Notes (Signed)
[facility] night shift.  The staff reports that the patient has persistent nausea despite being given ondansetron earlier, then Compazine 10 mg IVP without significant results.  The patient also complains of dizziness.  Meclizine 25 mg p.o. x1 dose trial given.  Follow-up clinically and consider using again if there is improvement of symptoms.  Tennis Must, MD

## 2020-10-24 DIAGNOSIS — G90A Postural orthostatic tachycardia syndrome (POTS): Secondary | ICD-10-CM | POA: Diagnosis present

## 2020-10-24 DIAGNOSIS — I498 Other specified cardiac arrhythmias: Secondary | ICD-10-CM | POA: Diagnosis present

## 2020-10-24 LAB — BASIC METABOLIC PANEL
Anion gap: 8 (ref 5–15)
BUN: 9 mg/dL (ref 8–23)
CO2: 22 mmol/L (ref 22–32)
Calcium: 8.4 mg/dL — ABNORMAL LOW (ref 8.9–10.3)
Chloride: 108 mmol/L (ref 98–111)
Creatinine, Ser: 0.73 mg/dL (ref 0.44–1.00)
GFR, Estimated: >60 mL/min (ref >60)
Glucose, Bld: 113 mg/dL — ABNORMAL HIGH (ref 70–99)
Potassium: 4.7 mmol/L (ref 3.5–5.1)
Sodium: 138 mmol/L (ref 135–145)

## 2020-10-24 MED ORDER — CITALOPRAM HYDROBROMIDE 20 MG PO TABS
20.0000 mg | ORAL_TABLET | Freq: Every day | ORAL | Status: DC
  Administered 2020-10-24 – 2020-10-25 (×2): 20 mg via ORAL
  Filled 2020-10-24 (×2): qty 1

## 2020-10-24 NOTE — Progress Notes (Signed)
PROGRESS NOTE    Megan Ingram  UVO:[id number] DOB: 1946/11/25 DOA: 10/22/2020 PCP: [name], NP    Brief Narrative:   Megan Ingram is a 74 y.o. female with medical history significant for depression, hypertension, migraines. Patient presented to the ED with complaints of lightheadedness, feeling off balance over the past 3 days.  Symptoms starts when she gets up, and is subsequently followed by nausea, dry heaving, vomiting and then symptoms resolve for the day, and re-occur again the next day.  Symptoms can last anywhere from 3 hours to 8 hours. When she is lying down she does not have these symptoms.  She has not had any episodes of actually passing out or falling.  Patient also reports over the past 2 months, she would check her blood pressure and systolic will drop from 742V systolic to 95G standing. She has some mild upper abdominal pain from multiple episodes of dry heaving and vomiting.  No loose stools.  Patient also reports she has always craved salt.  She denies ear problems- impaired hearing, ringing or fullness in her ears.  She reports similar occurrence in August of this year, at that time she had orthostatic hypotension, but her electrolytes were normal.  She had a CT of her abdomen which was unremarkable at that time.  She followed up with her out patient nurse practitioner, and subsequently cardiologist.  This was deemed not cardiac related.  She was subsequently referred to an endocrinologist which she saw last week- 10/25 , at that time she had no symptoms, her blood work was normal, cortisol level was checked was unremarkable.  Patient also reports her father is on Florinef, and she is worried her presentation may be related to why he is on Florinef.   Assessment & Plan:   Principal Problem:   Hyponatremia Active Problems:   Gastroesophageal reflux disease   Depression   HTN (hypertension)   Orthostatic hypotension   Acute hyponatremia -Unclear  etiology -Possibly related to vomiting and volume depletion -This has improved with IV fluids -Continue to monitor  Orthostatic hypotension -Patient describes recurrent episodes of dizziness, hypotension, nausea with dry heaves -She was checking her blood pressure at home and on standing it gone down in the 70s -The symptoms have been occurring for several months -She reports constantly craving/eating salt -When she eats excessive amounts of salt, her symptoms are usually controlled -Reports that her father had similar issues with hypotension and syncope and was on daily Florinef -Adrenal insufficiency ruled out as cortisol was recently checked and had appropriate response to ACTH -Despite receiving IV fluids in the hospital, she continued to have orthostasis -Clinically, she does not appear particularly dehydrated -I suspect that she has POTS -We will provide TED hose stockings -She has been started on low-dose Florinef -Discussed with cardiology, Dr. Harl Bowie and it was felt appropriate to start the patient on Florinef which dose can be titrated up by cardiology as an outpatient -Anticipate that she may be able to discharge home on 11/3 if symptoms are reasonably controlled  Depression -Currently on Celexa and bupropion   DVT prophylaxis: enoxaparin (LOVENOX) injection 40 mg Start: 10/22/20 2245  Code Status: Full code Family Communication: Discussed with patient Disposition Plan: Status is: Inpatient  Remains inpatient appropriate because:Ongoing diagnostic testing needed not appropriate for outpatient work up   Dispo: The patient is from: Home              Anticipated d/c is to: Home  Anticipated d/c date is: 1 day              Patient currently is not medically stable to d/c.    Consultants:     Procedures:     Antimicrobials:       Subjective: Patient had a recurrent episode of dizziness, nausea, vomiting and dry heaves overnight.  This occurred  after she had gotten up to go to the bathroom.  Objective: Vitals:   10/23/20 0904 10/23/20 2109 10/24/20 0435 10/24/20 1352  BP: 135/79 (!) 152/89 135/87 132/76  Pulse: 77 77 91 98  Resp: 20 20 20 20   Temp:  98.5 F (36.9 C) 98.7 F (37.1 C) 98.9 F (37.2 C)  TempSrc:  Oral  Oral  SpO2: 100% 99% 99% 98%  Weight:      Height:        Intake/Output Summary (Last 24 hours) at 10/24/2020 2100 Last data filed at 10/24/2020 1700 Gross per 24 hour  Intake 240 ml  Output --  Net 240 ml   Filed Weights   10/22/20 2029  Weight: 67.5 kg    Examination:  General exam: Alert, awake, oriented x 3 Respiratory system: Clear to auscultation. Respiratory effort normal. Cardiovascular system:RRR. No murmurs, rubs, gallops. Gastrointestinal system: Abdomen is nondistended, soft and nontender. No organomegaly or masses felt. Normal bowel sounds heard. Central nervous system: Alert and oriented. No focal neurological deficits. Extremities: No C/C/E, +pedal pulses Skin: No rashes, lesions or ulcers Psychiatry: Judgement and insight appear normal. Mood & affect appropriate.      Data Reviewed: I have personally reviewed following labs and imaging studies  CBC: Recent Labs  Lab 10/22/20 1259  WBC 7.0  NEUTROABS 5.3  HGB 12.9  HCT 38.7  MCV 91.5  PLT 417   Basic Metabolic Panel: Recent Labs  Lab 10/22/20 1259 10/23/20 0208 10/23/20 0732 10/24/20 0622  NA 121* 139 138 138  K 3.5 4.3 4.2 4.7  CL 91* 109 109 108  CO2 19* 24 21* 22  GLUCOSE 158* 90 91 113*  BUN 17 15 14 9   CREATININE 0.99 0.84 0.80 0.73  CALCIUM 8.3* 8.3* 8.0* 8.4*  MG 1.9  --   --   --    GFR: Estimated Creatinine Clearance: 56.5 mL/min (by C-G formula based on SCr of 0.73 mg/dL). Liver Function Tests: Recent Labs  Lab 10/22/20 1259  AST 25  ALT 15  ALKPHOS 91  BILITOT 0.4  PROT 7.6  ALBUMIN 4.2   Recent Labs  Lab 10/22/20 1259  LIPASE 27   No results for input(s): AMMONIA in the last 168  hours. Coagulation Profile: No results for input(s): INR, PROTIME in the last 168 hours. Cardiac Enzymes: No results for input(s): CKTOTAL, CKMB, CKMBINDEX, TROPONINI in the last 168 hours. BNP (last 3 results) No results for input(s): PROBNP in the last 8760 hours. HbA1C: No results for input(s): HGBA1C in the last 72 hours. CBG: No results for input(s): GLUCAP in the last 168 hours. Lipid Profile: No results for input(s): CHOL, HDL, LDLCALC, TRIG, CHOLHDL, LDLDIRECT in the last 72 hours. Thyroid Function Tests: Recent Labs    10/22/20 1259  TSH 2.594   Anemia Panel: No results for input(s): VITAMINB12, FOLATE, FERRITIN, TIBC, IRON, RETICCTPCT in the last 72 hours. Sepsis Labs: No results for input(s): PROCALCITON, LATICACIDVEN in the last 168 hours.  Recent Results (from the past 240 hour(s))  Respiratory Panel by RT PCR (Flu A&B, Covid) - Nasopharyngeal Swab  Status: None   Collection Time: 10/22/20  2:28 PM   Specimen: Nasopharyngeal Swab  Result Value Ref Range Status   SARS Coronavirus 2 by RT PCR NEGATIVE NEGATIVE Final    Comment: (NOTE) SARS-CoV-2 target nucleic acids are NOT DETECTED.  The SARS-CoV-2 RNA is generally detectable in upper respiratoy specimens during the acute phase of infection. The lowest concentration of SARS-CoV-2 viral copies this assay can detect is 131 copies/mL. A negative result does not preclude SARS-Cov-2 infection and should not be used as the sole basis for treatment or other patient management decisions. A negative result may occur with  improper specimen collection/handling, submission of specimen other than nasopharyngeal swab, presence of viral mutation(s) within the areas targeted by this assay, and inadequate number of viral copies (<131 copies/mL). A negative result must be combined with clinical observations, patient history, and epidemiological information. The expected result is Negative.  Fact Sheet for Patients:    PinkCheek.be  Fact Sheet for Healthcare Providers:  GravelBags.it  This test is no t yet approved or cleared by the [place] FDA and  has been authorized for detection and/or diagnosis of SARS-CoV-2 by FDA under an Emergency Use Authorization (EUA). This EUA will remain  in effect (meaning this test can be used) for the duration of the COVID-19 declaration under Section 564(b)(1) of the Act, 21 U.S.C. section 360bbb-3(b)(1), unless the authorization is terminated or revoked sooner.     Influenza A by PCR NEGATIVE NEGATIVE Final   Influenza B by PCR NEGATIVE NEGATIVE Final    Comment: (NOTE) The Xpert Xpress SARS-CoV-2/FLU/RSV assay is intended as an aid in  the diagnosis of influenza from Nasopharyngeal swab specimens and  should not be used as a sole basis for treatment. Nasal washings and  aspirates are unacceptable for Xpert Xpress SARS-CoV-2/FLU/RSV  testing.  Fact Sheet for Patients: PinkCheek.be  Fact Sheet for Healthcare Providers: GravelBags.it  This test is not yet approved or cleared by the [place] FDA and  has been authorized for detection and/or diagnosis of SARS-CoV-2 by  FDA under an Emergency Use Authorization (EUA). This EUA will remain  in effect (meaning this test can be used) for the duration of the  Covid-19 declaration under Section 564(b)(1) of the Act, 21  U.S.C. section 360bbb-3(b)(1), unless the authorization is  terminated or revoked. Performed at [facility], [address], [place], [place] [address]          Radiology Studies: ECHOCARDIOGRAM COMPLETE  Result Date: 10/23/2020    ECHOCARDIOGRAM REPORT   Patient Name:   Madalyn A Appleman Date of Exam: 10/23/2020 Medical Rec #:  [id number]        Height:       62.0 in Accession #:    [id number]       Weight:       148.8 lb Date of Birth:  [birth year]        BSA:           1.686 m Patient Age:    40 years         BP:           135/79 mmHg Patient Gender: F                HR:           77 bpm. Exam Location:  [facility] Procedure: 2D Echo Indications:    Postural dizziness with presyncope [[id number]  History:        Patient has  prior history of Echocardiogram examinations, most                 recent 10/06/2018. Signs/Symptoms:Chest Pain; Risk                 Factors:Former Smoker and Hypertension. GERD.  Sonographer:    Leavy Cella RDCS (AE) Referring Phys: [place]  1. Left ventricular ejection fraction, by estimation, is 55 to 60%. The left ventricle has normal function. The left ventricle has no regional wall motion abnormalities. Left ventricular diastolic parameters were normal.  2. Right ventricular systolic function is normal. The right ventricular size is normal.  3. The mitral valve is normal in structure. Mild mitral valve regurgitation. No evidence of mitral stenosis.  4. The aortic valve is tricuspid. Aortic valve regurgitation is mild.  5. The inferior vena cava is normal in size with greater than 50% respiratory variability, suggesting right atrial pressure of 3 mmHg. FINDINGS  Left Ventricle: Left ventricular ejection fraction, by estimation, is 55 to 60%. The left ventricle has normal function. The left ventricle has no regional wall motion abnormalities. The left ventricular internal cavity size was normal in size. There is  no left ventricular hypertrophy. Left ventricular diastolic parameters were normal. Right Ventricle: The right ventricular size is normal. No increase in right ventricular wall thickness. Right ventricular systolic function is normal. Left Atrium: Left atrial size was normal in size. Right Atrium: Right atrial size was normal in size. Pericardium: There is no evidence of pericardial effusion. Mitral Valve: The mitral valve is normal in structure. Mild mitral valve regurgitation. No evidence of mitral valve stenosis.  Tricuspid Valve: The tricuspid valve is normal in structure. Tricuspid valve regurgitation is not demonstrated. No evidence of tricuspid stenosis. Aortic Valve: The aortic valve is tricuspid. Aortic valve regurgitation is mild. Aortic regurgitation PHT measures 356 msec. Aortic valve mean gradient measures 2.8 mmHg. Aortic valve peak gradient measures 5.1 mmHg. Pulmonic Valve: The pulmonic valve was not well visualized. Pulmonic valve regurgitation is not visualized. No evidence of pulmonic stenosis. Aorta: The aortic root is normal in size and structure. Pulmonary Artery: Indeterminant PASP, inadequate TR jet. Venous: The inferior vena cava is normal in size with greater than 50% respiratory variability, suggesting right atrial pressure of 3 mmHg. IAS/Shunts: No atrial level shunt detected by color flow Doppler.  LEFT VENTRICLE PLAX 2D LVIDd:         4.73 cm  Diastology LVIDs:         3.35 cm  LV e' medial:    9.68 cm/s LV PW:         1.12 cm  LV E/e' medial:  6.3 LV IVS:        0.98 cm  LV e' lateral:   9.57 cm/s LVOT diam:     2.00 cm  LV E/e' lateral: 6.4 LVOT Area:     3.14 cm  RIGHT VENTRICLE RV S prime:     13.50 cm/s TAPSE (M-mode): 2.4 cm LEFT ATRIUM             Index       RIGHT ATRIUM           Index LA diam:        3.80 cm 2.25 cm/m  RA Area:     12.30 cm LA Vol (A2C):   42.1 ml 24.97 ml/m RA Volume:   30.90 ml  18.33 ml/m LA Vol (A4C):   30.1 ml 17.85 ml/m LA  Biplane Vol: 36.9 ml 21.89 ml/m  AORTIC VALVE AV Vmax:      112.93 cm/s AV Vmean:     79.321 cm/s AV VTI:       0.240 m AV Peak Grad: 5.1 mmHg AV Mean Grad: 2.8 mmHg AI PHT:       356 msec  AORTA Ao Root diam: 3.10 cm MITRAL VALVE MV Area (PHT): 2.64 cm    SHUNTS MV Decel Time: 287 msec    Systemic Diam: 2.00 cm MR Peak grad: 90.6 mmHg MR Vmax:      476.00 cm/s MV E velocity: 61.30 cm/s MV A velocity: 53.60 cm/s MV E/A ratio:  1.14 Carlyle Dolly MD Electronically signed by Carlyle Dolly MD Signature Date/Time: 10/23/2020/11:21:58 AM     Final         Scheduled Meds: . acyclovir  400 mg Oral Daily  . atorvastatin  20 mg Oral Daily  . buPROPion  150 mg Oral Daily  . citalopram  20 mg Oral Daily  . cycloSPORINE  1 drop Both Eyes BID  . enoxaparin (LOVENOX) injection  40 mg Subcutaneous Q24H  . fludrocortisone  0.1 mg Oral Daily  . pantoprazole  40 mg Oral Daily   Continuous Infusions:   LOS: 1 day    Time spent: 70mins    Kathie Dike, MD [facility]   If 7PM-7AM, please contact night-coverage [email or web address]  10/24/2020, 9:00 PM

## 2020-10-24 NOTE — Progress Notes (Signed)
Patient had an extreme episode of nausea and and dry heaves.  Patient became very diaphoretic and episode lasted near an hour.  Patient given zofran with no relief and compazine given per md order.

## 2020-10-25 DIAGNOSIS — K219 Gastro-esophageal reflux disease without esophagitis: Secondary | ICD-10-CM

## 2020-10-25 MED ORDER — FLUDROCORTISONE ACETATE 0.1 MG PO TABS: 0.1000 mg | ORAL_TABLET | Freq: Every day | ORAL | 1 refills | Status: AC

## 2020-10-25 MED ORDER — BUPROPION HCL ER (SR) 150 MG PO TB12: 150.0000 mg | ORAL_TABLET | Freq: Every day | ORAL | 1 refills | Status: AC

## 2020-10-25 NOTE — Discharge Summary (Signed)
Physician Discharge Summary  CHARMIKA MACDONNELL AUQ:[id number] DOB: [birth year] DOA: 10/22/2020  PCP: [name], NP  Admit date: 10/22/2020 Discharge date: 10/25/2020  Admitted From: Home Disposition:  Home   Recommendations for Outpatient Follow-up:  1. Follow up with PCP in 1-2 weeks 2. Please obtain BMP/CBC in one week     Discharge Condition: Stable CODE STATUS: FULL Diet recommendation: Heart Healthy   Brief/Interim Summary: 74 y.o.femalewith medical history significant fordepression, hypertension, migraines. Patient presented to the ED with complaints of lightheadedness, feeling off balance over the past 3 days. Symptoms startswhen she gets up, and issubsequently followed by nausea, dry heaving,vomiting and thensymptoms resolve for the day, and re-occur again the next day. Symptoms can last anywhere from 3 hours to 8 hours. When she is lying down she does not havethese symptoms. She has not had any episodes of actually passing out or falling. Patient also reports over the past 2 months, she wouldcheck her blood pressure and systolic will drop from 638L systolic to 37D standing. She has some mild upper abdominal pain from multiple episodes of dry heaving and vomiting. No loose stools. Patient also reports she has always cravedsalt.She denies ear problems-impaired hearing, ringing or fullness in herears.  She reports similar occurrence in August of this year, at that time she had orthostatic hypotension, but her electrolytes were normal. She had a CT of her abdomen which was unremarkable at that time. She followed up with her out patientnurse practitioner, and subsequently cardiologist.This was deemed not cardiac related. She was subsequently referred to an endocrinologist which she saw last week- 10/25 ,at that time she had no symptoms, her blood work was normal, cortisol level was checked was unremarkable.  Patient also reports her father is on  Florinef,and she is worried her presentation may be related to why he ison Florinef.  Discharge Diagnoses:  Acute hyponatremia -Unclear etiology -Possibly related to vomiting and volume depletion -This has improved with IV fluids -Na 138 at time of d/c  Orthostatic hypotension -Patient describes recurrent episodes of dizziness, hypotension, nausea with dry heaves -She was checking her blood pressure at home and on standing it gone down in the 70s -The symptoms have been occurring for several months -She reports constantly craving/eating salt -When she eats excessive amounts of salt, her symptoms are usually controlled -Reports that her father had similar issues with hypotension and syncope and was on daily Florinef -Adrenal insufficiency ruled out as cortisol was recently checked and had appropriate response to ACTH -Despite receiving IV fluids in the hospital, she continued to have orthostasis -Clinically, she does not appear particularly dehydrated -suspect that she has POTS -provide TED hose stockings and abd binder -She has been started on low-dose Florinef -Discussed with cardiology, Dr. Harl Bowie and it was felt appropriate to start the patient on Florinef which dose can be titrated up by cardiology as an outpatient -11/3 orthostatics--supine 143/86>>>125/81 (standing); also symptomatically improved -BP improved to 131/85 after standing 1 min  Depression -Currently on  bupropion   Discharge Instructions   Allergies as of 10/25/2020      Reactions   Bee Venom Anaphylaxis   Fentanyl And Related Hives   Versed [midazolam] Hives      Medication List    STOP taking these medications   citalopram 20 MG tablet Commonly known as: CELEXA     TAKE these medications   acyclovir 400 MG tablet Commonly known as: ZOVIRAX Take 400 mg by mouth daily.   atorvastatin 20 MG tablet  Commonly known as: LIPITOR TAKE 1 TABLET BY MOUTH ONCE DAILY.   buPROPion 150 MG 12 hr  tablet Commonly known as: WELLBUTRIN SR Take 1 tablet (150 mg total) by mouth daily. Start taking on: October 26, 2020   cycloSPORINE 0.05 % ophthalmic emulsion Commonly known as: RESTASIS Place 1 drop into both eyes 2 (two) times daily.   dicyclomine 10 MG capsule Commonly known as: BENTYL 1 PO 30 MINUTES PRIOR TO BREAKFAST AND LUNCH What changed:   how much to take  how to take this  when to take this  additional instructions   estradiol 0.1 MG/GM vaginal cream Commonly known as: ESTRACE VAGINAL Apply 0.5mg  (pea-sized amount)  just inside the vaginal introitus with a finger-tip on Monday, Wednesday and Friday nights.   fludrocortisone 0.1 MG tablet Commonly known as: FLORINEF Take 1 tablet (0.1 mg total) by mouth daily. Start taking on: October 26, 2020   HM Vitamin D3 100 MCG (4000 UT) Caps Generic drug: Cholecalciferol Take 4,000 Units by mouth every Monday, Wednesday, and Friday.   multivitamin with minerals Tabs tablet Take 1 tablet by mouth daily.   omeprazole 40 MG capsule Commonly known as: PRILOSEC Take 40 mg by mouth daily.   TURMERIC PO Take 1 tablet by mouth daily.       Allergies  Allergen Reactions  . Bee Venom Anaphylaxis  . Fentanyl And Related Hives  . Versed [Midazolam] Hives    Consultations:  none   Procedures/Studies: CT Head Wo Contrast  Result Date: 10/22/2020 CLINICAL DATA:  Dizziness. EXAM: CT HEAD WITHOUT CONTRAST TECHNIQUE: Contiguous axial images were obtained from the base of the skull through the vertex without intravenous contrast. COMPARISON:  None. FINDINGS: Brain: The ventricles are normal in size and configuration. No extra-axial fluid collections are identified. The gray-Mcevers differentiation is maintained. No CT findings for acute hemispheric infarction or intracranial hemorrhage. No mass lesions. The brainstem and cerebellum are normal. Vascular: Scattered vascular calcifications. No hyperdense vessels or obvious  aneurysm. Skull: No acute skull fracture.  No bone lesion. Sinuses/Orbits: The paranasal sinuses and mastoid air cells are clear. The globes are intact. Other: No scalp lesions, laceration or hematoma. IMPRESSION: Normal head CT for age. Electronically Signed   By: Marijo Sanes M.D.   On: 10/22/2020 14:10   ECHOCARDIOGRAM COMPLETE  Result Date: 10/23/2020    ECHOCARDIOGRAM REPORT   Patient Name:   Landa A Brendlinger Date of Exam: 10/23/2020 Medical Rec #:  [id number]        Height:       62.0 in Accession #:    [id number]       Weight:       148.8 lb Date of Birth:  11-Sep-1946        BSA:          1.686 m Patient Age:    69 years         BP:           135/79 mmHg Patient Gender: F                HR:           77 bpm. Exam Location:  [facility] Procedure: 2D Echo Indications:    Postural dizziness with presyncope [[id number]  History:        Patient has prior history of Echocardiogram examinations, most                 recent 10/06/2018. Signs/Symptoms:Chest Pain; Risk  Factors:Former Smoker and Hypertension. GERD.  Sonographer:    Leavy Cella RDCS (AE) Referring Phys: [place]  1. Left ventricular ejection fraction, by estimation, is 55 to 60%. The left ventricle has normal function. The left ventricle has no regional wall motion abnormalities. Left ventricular diastolic parameters were normal.  2. Right ventricular systolic function is normal. The right ventricular size is normal.  3. The mitral valve is normal in structure. Mild mitral valve regurgitation. No evidence of mitral stenosis.  4. The aortic valve is tricuspid. Aortic valve regurgitation is mild.  5. The inferior vena cava is normal in size with greater than 50% respiratory variability, suggesting right atrial pressure of 3 mmHg. FINDINGS  Left Ventricle: Left ventricular ejection fraction, by estimation, is 55 to 60%. The left ventricle has normal function. The left ventricle has no regional wall motion  abnormalities. The left ventricular internal cavity size was normal in size. There is  no left ventricular hypertrophy. Left ventricular diastolic parameters were normal. Right Ventricle: The right ventricular size is normal. No increase in right ventricular wall thickness. Right ventricular systolic function is normal. Left Atrium: Left atrial size was normal in size. Right Atrium: Right atrial size was normal in size. Pericardium: There is no evidence of pericardial effusion. Mitral Valve: The mitral valve is normal in structure. Mild mitral valve regurgitation. No evidence of mitral valve stenosis. Tricuspid Valve: The tricuspid valve is normal in structure. Tricuspid valve regurgitation is not demonstrated. No evidence of tricuspid stenosis. Aortic Valve: The aortic valve is tricuspid. Aortic valve regurgitation is mild. Aortic regurgitation PHT measures 356 msec. Aortic valve mean gradient measures 2.8 mmHg. Aortic valve peak gradient measures 5.1 mmHg. Pulmonic Valve: The pulmonic valve was not well visualized. Pulmonic valve regurgitation is not visualized. No evidence of pulmonic stenosis. Aorta: The aortic root is normal in size and structure. Pulmonary Artery: Indeterminant PASP, inadequate TR jet. Venous: The inferior vena cava is normal in size with greater than 50% respiratory variability, suggesting right atrial pressure of 3 mmHg. IAS/Shunts: No atrial level shunt detected by color flow Doppler.  LEFT VENTRICLE PLAX 2D LVIDd:         4.73 cm  Diastology LVIDs:         3.35 cm  LV e' medial:    9.68 cm/s LV PW:         1.12 cm  LV E/e' medial:  6.3 LV IVS:        0.98 cm  LV e' lateral:   9.57 cm/s LVOT diam:     2.00 cm  LV E/e' lateral: 6.4 LVOT Area:     3.14 cm  RIGHT VENTRICLE RV S prime:     13.50 cm/s TAPSE (M-mode): 2.4 cm LEFT ATRIUM             Index       RIGHT ATRIUM           Index LA diam:        3.80 cm 2.25 cm/m  RA Area:     12.30 cm LA Vol (A2C):   42.1 ml 24.97 ml/m RA Volume:    30.90 ml  18.33 ml/m LA Vol (A4C):   30.1 ml 17.85 ml/m LA Biplane Vol: 36.9 ml 21.89 ml/m  AORTIC VALVE AV Vmax:      112.93 cm/s AV Vmean:     79.321 cm/s AV VTI:       0.240 m AV Peak Grad: 5.1 mmHg AV  Mean Grad: 2.8 mmHg AI PHT:       356 msec  AORTA Ao Root diam: 3.10 cm MITRAL VALVE MV Area (PHT): 2.64 cm    SHUNTS MV Decel Time: 287 msec    Systemic Diam: 2.00 cm MR Peak grad: 90.6 mmHg MR Vmax:      476.00 cm/s MV E velocity: 61.30 cm/s MV A velocity: 53.60 cm/s MV E/A ratio:  1.14 Carlyle Dolly MD Electronically signed by Carlyle Dolly MD Signature Date/Time: 10/23/2020/11:21:58 AM    Final          Discharge Exam: Vitals:   10/25/20 0916 10/25/20 0930  BP: 140/79 135/85  Pulse: 80   Resp: 16   Temp:    SpO2: 98%    Vitals:   10/24/20 2120 10/25/20 0456 10/25/20 0916 10/25/20 0930  BP: 134/75 (!) 141/87 140/79 135/85  Pulse: 87 94 80   Resp: 18 18 16    Temp: 98.9 F (37.2 C) 98.5 F (36.9 C)    TempSrc:  Oral    SpO2: 99% 98% 98%   Weight:      Height:        General: Pt is alert, awake, not in acute distress Cardiovascular: RRR, S1/S2 +, no rubs, no gallops Respiratory: CTA bilaterally, no wheezing, no rhonchi Abdominal: Soft, NT, ND, bowel sounds + Extremities: no edema, no cyanosis   The results of significant diagnostics from this hospitalization (including imaging, microbiology, ancillary and laboratory) are listed below for reference.    Significant Diagnostic Studies: CT Head Wo Contrast  Result Date: 10/22/2020 CLINICAL DATA:  Dizziness. EXAM: CT HEAD WITHOUT CONTRAST TECHNIQUE: Contiguous axial images were obtained from the base of the skull through the vertex without intravenous contrast. COMPARISON:  None. FINDINGS: Brain: The ventricles are normal in size and configuration. No extra-axial fluid collections are identified. The gray-Sidman differentiation is maintained. No CT findings for acute hemispheric infarction or intracranial hemorrhage. No  mass lesions. The brainstem and cerebellum are normal. Vascular: Scattered vascular calcifications. No hyperdense vessels or obvious aneurysm. Skull: No acute skull fracture.  No bone lesion. Sinuses/Orbits: The paranasal sinuses and mastoid air cells are clear. The globes are intact. Other: No scalp lesions, laceration or hematoma. IMPRESSION: Normal head CT for age. Electronically Signed   By: Marijo Sanes M.D.   On: 10/22/2020 14:10   ECHOCARDIOGRAM COMPLETE  Result Date: 10/23/2020    ECHOCARDIOGRAM REPORT   Patient Name:   Adri A Azam Date of Exam: 10/23/2020 Medical Rec #:  [id number]        Height:       62.0 in Accession #:    [id number]       Weight:       148.8 lb Date of Birth:  05/05/1946        BSA:          1.686 m Patient Age:    61 years         BP:           135/79 mmHg Patient Gender: F                HR:           77 bpm. Exam Location:  [facility] Procedure: 2D Echo Indications:    Postural dizziness with presyncope [[id number]  History:        Patient has prior history of Echocardiogram examinations, most  recent 10/06/2018. Signs/Symptoms:Chest Pain; Risk                 Factors:Former Smoker and Hypertension. GERD.  Sonographer:    Leavy Cella RDCS (AE) Referring Phys: [place]  1. Left ventricular ejection fraction, by estimation, is 55 to 60%. The left ventricle has normal function. The left ventricle has no regional wall motion abnormalities. Left ventricular diastolic parameters were normal.  2. Right ventricular systolic function is normal. The right ventricular size is normal.  3. The mitral valve is normal in structure. Mild mitral valve regurgitation. No evidence of mitral stenosis.  4. The aortic valve is tricuspid. Aortic valve regurgitation is mild.  5. The inferior vena cava is normal in size with greater than 50% respiratory variability, suggesting right atrial pressure of 3 mmHg. FINDINGS  Left Ventricle: Left ventricular  ejection fraction, by estimation, is 55 to 60%. The left ventricle has normal function. The left ventricle has no regional wall motion abnormalities. The left ventricular internal cavity size was normal in size. There is  no left ventricular hypertrophy. Left ventricular diastolic parameters were normal. Right Ventricle: The right ventricular size is normal. No increase in right ventricular wall thickness. Right ventricular systolic function is normal. Left Atrium: Left atrial size was normal in size. Right Atrium: Right atrial size was normal in size. Pericardium: There is no evidence of pericardial effusion. Mitral Valve: The mitral valve is normal in structure. Mild mitral valve regurgitation. No evidence of mitral valve stenosis. Tricuspid Valve: The tricuspid valve is normal in structure. Tricuspid valve regurgitation is not demonstrated. No evidence of tricuspid stenosis. Aortic Valve: The aortic valve is tricuspid. Aortic valve regurgitation is mild. Aortic regurgitation PHT measures 356 msec. Aortic valve mean gradient measures 2.8 mmHg. Aortic valve peak gradient measures 5.1 mmHg. Pulmonic Valve: The pulmonic valve was not well visualized. Pulmonic valve regurgitation is not visualized. No evidence of pulmonic stenosis. Aorta: The aortic root is normal in size and structure. Pulmonary Artery: Indeterminant PASP, inadequate TR jet. Venous: The inferior vena cava is normal in size with greater than 50% respiratory variability, suggesting right atrial pressure of 3 mmHg. IAS/Shunts: No atrial level shunt detected by color flow Doppler.  LEFT VENTRICLE PLAX 2D LVIDd:         4.73 cm  Diastology LVIDs:         3.35 cm  LV e' medial:    9.68 cm/s LV PW:         1.12 cm  LV E/e' medial:  6.3 LV IVS:        0.98 cm  LV e' lateral:   9.57 cm/s LVOT diam:     2.00 cm  LV E/e' lateral: 6.4 LVOT Area:     3.14 cm  RIGHT VENTRICLE RV S prime:     13.50 cm/s TAPSE (M-mode): 2.4 cm LEFT ATRIUM             Index        RIGHT ATRIUM           Index LA diam:        3.80 cm 2.25 cm/m  RA Area:     12.30 cm LA Vol (A2C):   42.1 ml 24.97 ml/m RA Volume:   30.90 ml  18.33 ml/m LA Vol (A4C):   30.1 ml 17.85 ml/m LA Biplane Vol: 36.9 ml 21.89 ml/m  AORTIC VALVE AV Vmax:      112.93 cm/s AV Vmean:  79.321 cm/s AV VTI:       0.240 m AV Peak Grad: 5.1 mmHg AV Mean Grad: 2.8 mmHg AI PHT:       356 msec  AORTA Ao Root diam: 3.10 cm MITRAL VALVE MV Area (PHT): 2.64 cm    SHUNTS MV Decel Time: 287 msec    Systemic Diam: 2.00 cm MR Peak grad: 90.6 mmHg MR Vmax:      476.00 cm/s MV E velocity: 61.30 cm/s MV A velocity: 53.60 cm/s MV E/A ratio:  1.14 Carlyle Dolly MD Electronically signed by Carlyle Dolly MD Signature Date/Time: 10/23/2020/11:21:58 AM    Final      Microbiology: Recent Results (from the past 240 hour(s))  Respiratory Panel by RT PCR (Flu A&B, Covid) - Nasopharyngeal Swab     Status: None   Collection Time: 10/22/20  2:28 PM   Specimen: Nasopharyngeal Swab  Result Value Ref Range Status   SARS Coronavirus 2 by RT PCR NEGATIVE NEGATIVE Final    Comment: (NOTE) SARS-CoV-2 target nucleic acids are NOT DETECTED.  The SARS-CoV-2 RNA is generally detectable in upper respiratoy specimens during the acute phase of infection. The lowest concentration of SARS-CoV-2 viral copies this assay can detect is 131 copies/mL. A negative result does not preclude SARS-Cov-2 infection and should not be used as the sole basis for treatment or other patient management decisions. A negative result may occur with  improper specimen collection/handling, submission of specimen other than nasopharyngeal swab, presence of viral mutation(s) within the areas targeted by this assay, and inadequate number of viral copies (<131 copies/mL). A negative result must be combined with clinical observations, patient history, and epidemiological information. The expected result is Negative.  Fact Sheet for Patients:    PinkCheek.be  Fact Sheet for Healthcare Providers:  GravelBags.it  This test is no t yet approved or cleared by the [place] FDA and  has been authorized for detection and/or diagnosis of SARS-CoV-2 by FDA under an Emergency Use Authorization (EUA). This EUA will remain  in effect (meaning this test can be used) for the duration of the COVID-19 declaration under Section 564(b)(1) of the Act, 21 U.S.C. section 360bbb-3(b)(1), unless the authorization is terminated or revoked sooner.     Influenza A by PCR NEGATIVE NEGATIVE Final   Influenza B by PCR NEGATIVE NEGATIVE Final    Comment: (NOTE) The Xpert Xpress SARS-CoV-2/FLU/RSV assay is intended as an aid in  the diagnosis of influenza from Nasopharyngeal swab specimens and  should not be used as a sole basis for treatment. Nasal washings and  aspirates are unacceptable for Xpert Xpress SARS-CoV-2/FLU/RSV  testing.  Fact Sheet for Patients: PinkCheek.be  Fact Sheet for Healthcare Providers: GravelBags.it  This test is not yet approved or cleared by the [place] FDA and  has been authorized for detection and/or diagnosis of SARS-CoV-2 by  FDA under an Emergency Use Authorization (EUA). This EUA will remain  in effect (meaning this test can be used) for the duration of the  Covid-19 declaration under Section 564(b)(1) of the Act, 21  U.S.C. section 360bbb-3(b)(1), unless the authorization is  terminated or revoked. Performed at [facility], [address] [place], [place] [address]      Labs: Basic Metabolic Panel: Recent Labs  Lab 10/22/20 1259 10/22/20 1259 10/23/20 0208 10/23/20 0208 10/23/20 0732 10/24/20 0622  NA 121*  --  139  --  138 138  K 3.5   < > 4.3   < > 4.2 4.7  CL 91*  --  109  --  109 108  CO2 19*  --  24  --  21* 22  GLUCOSE 158*  --  90  --  91 113*  BUN 17  --  15  --  14  9  CREATININE 0.99  --  0.84  --  0.80 0.73  CALCIUM 8.3*  --  8.3*  --  8.0* 8.4*  MG 1.9  --   --   --   --   --    < > = values in this interval not displayed.   Liver Function Tests: Recent Labs  Lab 10/22/20 1259  AST 25  ALT 15  ALKPHOS 91  BILITOT 0.4  PROT 7.6  ALBUMIN 4.2   Recent Labs  Lab 10/22/20 1259  LIPASE 27   No results for input(s): AMMONIA in the last 168 hours. CBC: Recent Labs  Lab 10/22/20 1259  WBC 7.0  NEUTROABS 5.3  HGB 12.9  HCT 38.7  MCV 91.5  PLT 251   Cardiac Enzymes: No results for input(s): CKTOTAL, CKMB, CKMBINDEX, TROPONINI in the last 168 hours. BNP: Invalid input(s): POCBNP CBG: No results for input(s): GLUCAP in the last 168 hours.  Time coordinating discharge:  36 minutes  Signed:  Orson Eva, DO [facility] Pager: [phone] 10/25/2020, 9:47 AM

## 2020-11-13 ENCOUNTER — Ambulatory Visit (INDEPENDENT_AMBULATORY_CARE_PROVIDER_SITE_OTHER): Payer: Medicare Other

## 2020-11-13 ENCOUNTER — Other Ambulatory Visit: Payer: Self-pay

## 2020-11-13 DIAGNOSIS — N3941 Urge incontinence: Secondary | ICD-10-CM

## 2020-11-13 NOTE — Progress Notes (Signed)
PTNS  Session # Monthly  Health & Social Factors: No change Caffeine: 1 Alcohol: 0 Daytime voids #per day: 5 Night-time voids #per night: 0 Urgency: Mild Incontinence Episodes #per day: 0 Ankle used: Left Treatment Setting: 2 Feeling/ Response: Toe flex & sensory Comments: Patient tolerated well, no complications were noted  Performed By: Bradly Bienenstock, CMA   Follow Up: RTC in 1 month for monthy maintenance.

## 2020-12-11 ENCOUNTER — Encounter: Payer: Self-pay | Admitting: Internal Medicine

## 2020-12-11 ENCOUNTER — Ambulatory Visit (INDEPENDENT_AMBULATORY_CARE_PROVIDER_SITE_OTHER): Payer: Medicare Other | Admitting: Family Medicine

## 2020-12-11 ENCOUNTER — Other Ambulatory Visit: Payer: Self-pay

## 2020-12-11 ENCOUNTER — Ambulatory Visit: Payer: Medicare Other

## 2020-12-11 DIAGNOSIS — N3941 Urge incontinence: Secondary | ICD-10-CM | POA: Diagnosis not present

## 2020-12-11 NOTE — Progress Notes (Signed)
PTNS  Session # monthly maintanence  Health & Social Factors: no change Caffeine: 1 Alcohol: 0 Daytime voids #per day: 5 Night-time voids #per night: 0 Urgency: mild Incontinence Episodes #per day: 0 Ankle used: right Treatment Setting: left Feeling/ Response: sensory Comments: Patient tolerated well  Performed By: Elberta Leatherwood, CMA  Follow Up: 1 month

## 2021-01-15 ENCOUNTER — Other Ambulatory Visit: Payer: Self-pay

## 2021-01-15 ENCOUNTER — Ambulatory Visit (INDEPENDENT_AMBULATORY_CARE_PROVIDER_SITE_OTHER): Payer: Medicare Other

## 2021-01-15 DIAGNOSIS — N3941 Urge incontinence: Secondary | ICD-10-CM

## 2021-01-15 NOTE — Progress Notes (Signed)
PTNS  Session # monthly maintanence  Health & Social Factors: no change Caffeine: 1 Alcohol: 0 Daytime voids #per day: 5 Night-time voids #per night: 0 Urgency: mild Incontinence Episodes #per day: 0 Ankle used: left Treatment Setting: 5 Feeling/ Response: sensory Comments: patient tolerated well  Performed By: Fonnie Jarvis, CMA  Follow Up: 1 month

## 2021-02-09 ENCOUNTER — Ambulatory Visit: Payer: Medicare Other | Admitting: Cardiology

## 2021-02-12 ENCOUNTER — Ambulatory Visit (INDEPENDENT_AMBULATORY_CARE_PROVIDER_SITE_OTHER): Payer: Medicare Other

## 2021-02-12 ENCOUNTER — Other Ambulatory Visit: Payer: Self-pay

## 2021-02-12 DIAGNOSIS — N3941 Urge incontinence: Secondary | ICD-10-CM | POA: Diagnosis not present

## 2021-02-12 NOTE — Progress Notes (Signed)
PTNS  Session # monthly maintanence  Health & Social Factors: dx w/POTs extra salt and increased water Caffeine: 1 Alcohol: 0 Daytime voids #per day: 5-6 Night-time voids #per night: 0 Urgency: mild Incontinence Episodes #per day: 0 Ankle used: right Treatment Setting: 3 Feeling/ Response: sensory Comments: patient tolerated well  Performed By: Fonnie Jarvis, CMA   Follow Up: 1 month

## 2021-02-14 ENCOUNTER — Ambulatory Visit (INDEPENDENT_AMBULATORY_CARE_PROVIDER_SITE_OTHER): Payer: Medicare Other | Admitting: Cardiology

## 2021-02-14 ENCOUNTER — Other Ambulatory Visit: Payer: Self-pay

## 2021-02-14 ENCOUNTER — Encounter: Payer: Self-pay | Admitting: Cardiology

## 2021-02-14 VITALS — BP 130/80 | HR 98 | Ht 62.0 in | Wt 151.0 lb

## 2021-02-14 DIAGNOSIS — I498 Other specified cardiac arrhythmias: Secondary | ICD-10-CM

## 2021-02-14 DIAGNOSIS — G90A Postural orthostatic tachycardia syndrome (POTS): Secondary | ICD-10-CM

## 2021-02-14 NOTE — Patient Instructions (Signed)
Medication Instructions:  Your physician recommends that you continue on your current medications as directed. Please refer to the Current Medication list given to you today. *If you need a refill on your cardiac medications before your next appointment, please call your pharmacy*   Lab Work: none If you have labs (blood work) drawn today and your tests are completely normal, you will receive your results only by: Marland Kitchen MyChart Message (if you have MyChart) OR . A paper copy in the mail If you have any lab test that is abnormal or we need to change your treatment, we will call you to review the results.   Testing/Procedures: none   Follow-Up: At [facility], you and your health needs are our priority.  As part of our continuing mission to provide you with exceptional heart care, we have created designated Provider Care Teams.  These Care Teams include your primary Cardiologist (physician) and Advanced Practice Providers (APPs -  Physician Assistants and Nurse Practitioners) who all work together to provide you with the care you need, when you need it.  We recommend signing up for the patient portal called "MyChart".  Sign up information is provided on this After Visit Summary.  MyChart is used to connect with patients for Virtual Visits (Telemedicine).  Patients are able to view lab/test results, encounter notes, upcoming appointments, etc.  Non-urgent messages can be sent to your provider as well.   To learn more about what you can do with MyChart, go to [email or web address].    Your next appointment:   6 month(s)  The format for your next appointment:   In Person  Provider:   You may see Candee Furbish, MD  or one of the following Advanced Practice Providers on your designated Care Team:    Kathyrn Drown, NP

## 2021-02-14 NOTE — Progress Notes (Signed)
Cardiology Office Note:    Date:  02/14/2021   ID:  Megan Ingram, [place] Mar 11, 1946, MRN [id number]  PCP:  [name], NP   [place]  Cardiologist:  Candee Furbish, MD  Advanced Practice Provider:  No care team member to display Electrophysiologist:  None       Referring MD: [name], NP     History of Present Illness:    Megan Ingram is a 75 y.o. female here for the follow-up of palpitations.  Retired Therapist, sports.  Previously reported having ectopic ovarian pregnancy rupture with bleeding and arrested briefly on the operating table.  She was seen last in August by Megan Merle, NP.  At that time she was complaining of seeing spots, low blood pressure.  She mentioned that her father was on Florinef for a few years of his life she wonders if she needed it.  She added salt, craves salt.  Orthostatics were performed at that time:   BP lying is 136/85 with HR 72. BP sitting is 113/77 with HR 81 Standing BP is 111/71 with HR 88 ("little dizzy") Standing BP at 3 minutes is 112/70 with HR 96.   Past Medical History:  Diagnosis Date  . Chest pain   . Complication of anesthesia   . Degenerative joint disease   . Depression   . Endometriosis   . Gastroesophageal reflux disease   . Genital herpes   . Migraines    Ceased at menopause  . Osteopenia   . PONV (postoperative nausea and vomiting)     Past Surgical History:  Procedure Laterality Date  . ABDOMINAL HYSTERECTOMY     Single ovary remained in place.  . ABDOMINAL SURGERY    . BIOPSY N/A 04/26/2014   Procedure: GASTRIC BIOPSY;  Surgeon: Danie Binder, MD;  Location: [facility];  Service: Endoscopy;  Laterality: N/A;  . BUNIONECTOMY  2007   Bilateral  . CATARACT EXTRACTION  2012   bilateral; with lens implant  . [place]  . COLONOSCOPY  2000   incomplete due to pain  . COLONOSCOPY WITH PROPOFOL N/A 04/26/2014   Procedure: COLONOSCOPY WITH PROPOFOL;  Surgeon: Danie Binder, MD;  Location: [facility];  Service: Endoscopy;  Laterality: N/A;  in cecum at 0810 out at 0839 = 29 total minutes  . COLONOSCOPY WITH PROPOFOL N/A 04/04/2020   Procedure: COLONOSCOPY WITH PROPOFOL;  Surgeon: Danie Binder, MD;  Location: [facility];  Service: Endoscopy;  Laterality: N/A;  12:00pm  . ESOPHAGOGASTRODUODENOSCOPY (EGD) WITH PROPOFOL N/A 04/26/2014   Procedure: ESOPHAGOGASTRODUODENOSCOPY (EGD) WITH PROPOFOL;  Surgeon: Danie Binder, MD;  Location: [facility];  Service: Endoscopy;  Laterality: N/A;  . EXPLORATORY LAPAROTOMY     ovarian ectopic pregnancy  . POLYPECTOMY N/A 04/26/2014   Procedure: POLYPECTOMY;  Surgeon: Danie Binder, MD;  Location: [facility];  Service: Endoscopy;  Laterality: N/A;  . POLYPECTOMY  04/04/2020   Procedure: POLYPECTOMY;  Surgeon: Danie Binder, MD;  Location: [facility];  Service: Endoscopy;;  . ROTATOR CUFF REPAIR  2013   Left  . SALPINGOOPHORECTOMY  1971  . SAVORY DILATION N/A 04/26/2014   Procedure: SAVORY DILATION;  Surgeon: Danie Binder, MD;  Location: [facility];  Service: Endoscopy;  Laterality: N/A;  14/15/16  . SEPTOPLASTY  03/23/2016    Current Medications: Current Meds  Medication Sig  . acyclovir (ZOVIRAX) 400 MG tablet Take 400 mg by mouth daily.  Marland Kitchen  atorvastatin (LIPITOR) 20 MG tablet TAKE 1 TABLET BY MOUTH ONCE DAILY.  Marland Kitchen buPROPion (WELLBUTRIN SR) 150 MG 12 hr tablet Take 1 tablet (150 mg total) by mouth daily.  . Cholecalciferol 100 MCG (4000 UT) CAPS Take 4,000 Units by mouth every Monday, Wednesday, and Friday.   . cycloSPORINE (RESTASIS) 0.05 % ophthalmic emulsion Place 1 drop into both eyes 2 (two) times daily.  Marland Kitchen dicyclomine (BENTYL) 10 MG capsule 1 PO 30 MINUTES PRIOR TO BREAKFAST AND LUNCH  . estradiol (ESTRACE VAGINAL) 0.1 MG/GM vaginal cream Apply 0.5mg  (pea-sized amount)  just inside the vaginal introitus with a finger-tip on Monday, Wednesday and Friday nights.  . fludrocortisone (FLORINEF) 0.1 MG tablet Take 1 tablet (0.1  mg total) by mouth daily.  . Multiple Vitamin (MULTIVITAMIN WITH MINERALS) TABS tablet Take 1 tablet by mouth daily.  Marland Kitchen omeprazole (PRILOSEC) 40 MG capsule Take 40 mg by mouth daily.  . TURMERIC PO Take 1 tablet by mouth daily.      Allergies:   Bee venom, Fentanyl and related, and Versed [midazolam]   Social History   Socioeconomic History  . Marital status: Single    Spouse name: Not on file  . Number of children: Not on file  . Years of education: Not on file  . Highest education level: Not on file  Occupational History  . 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 Smoking status: Former Smoker    Packs/day: 0.50    Years: 2.00    Pack years: 1.00    Types: Cigarettes    Start date: 01/27/1977    Quit date: 01/27/1979    Years since quitting: 42.0  . Smokeless tobacco: Never Used  Vaping Use  . Vaping Use: Never used  Substance and Sexual Activity  . Alcohol use: Yes    Comment: Occasional, 1-2 glasses of wine a month  . Drug use: No  . Sexual activity: Yes    Birth control/protection: Surgical  Other Topics Concern  . Not on file  Social History Narrative  . Not on file   Social Determinants of Health   Financial Resource Strain: Not on file  Food Insecurity: Not on file  Transportation Needs: Not on file  Physical Activity: Not on file  Stress: Not on file  Social Connections: Not on file     Family History: The patient's family history includes Breast cancer in her mother; COPD in her mother; Colon cancer (age of onset: 20) in her sister; Macular degeneration in her mother; Parkinson's disease in her father; Stroke in her paternal grandfather; Valvular heart disease in her mother.  ROS:   Please see the history of present illness.     All other systems reviewed and are negative.  EKGs/Labs/Other Studies Reviewed:    The following studies were reviewed today: ECHO 10/06/18:  - Left ventricle: The  cavity size was normal. Systolic function was normal. The estimated ejection fraction was in the range of 55% to 60%. Wall motion was normal; there were no regional wall motion abnormalities. Left ventricular diastolic function parameters were normal. - Aortic valve: Transvalvular velocity was within the normal range. There was no stenosis. There was mild regurgitation. Regurgitation pressure half-time: 542 ms. - Aorta: Ascending aortic diameter: 38 mm (S). - Ascending aorta: The ascending aorta was mildly dilated. - Mitral valve: Transvalvular velocity was within the normal range. There was no evidence for stenosis. There was mild regurgitation. - Left  atrium: The atrium was severely dilated. - Right ventricle: The cavity size was normal. Wall thickness was normal. Systolic function was normal. - Tricuspid valve: There was trivial regurgitation. - Pulmonary arteries: Systolic pressure was mildly increased. PA peak pressure: 29 mm Hg (S).    10/06/18 MONITOR:  Occasional PACs noted.  Brief paroxysmal atrial tachycardia noted.   No atrial fibrillation, no ventricular tachycardia.    Recent Labs: 10/22/2020: ALT 15; Hemoglobin 12.9; Magnesium 1.9; Platelets 251; TSH 2.594 10/24/2020: BUN 9; Creatinine, Ser 0.73; Potassium 4.7; Sodium 138  Recent Lipid Panel No results found for: CHOL, TRIG, HDL, CHOLHDL, VLDL, LDLCALC, LDLDIRECT   Risk Assessment/Calculations:      Physical Exam:    VS:  BP 130/80 (BP Location: Left Arm, Patient Position: Sitting, Cuff Size: Normal)   Pulse 98   Ht 5\' 2"  (1.575 m)   Wt 151 lb (68.5 kg)   SpO2 96%   BMI 27.62 kg/m     Wt Readings from Last 3 Encounters:  02/14/21 151 lb (68.5 kg)  10/22/20 148 lb 13 oz (67.5 kg)  10/16/20 154 lb (69.9 kg)     GEN:  Well nourished, well developed in no acute distress HEENT: Normal NECK: No JVD; No carotid bruits LYMPHATICS: No lymphadenopathy CARDIAC: RRR, no murmurs, rubs,  gallops RESPIRATORY:  Clear to auscultation without rales, wheezing or rhonchi  ABDOMEN: Soft, non-tender, non-distended MUSCULOSKELETAL:  No edema; No deformity  SKIN: Warm and dry NEUROLOGIC:  Alert and oriented x 3 PSYCHIATRIC:  Normal affect   ASSESSMENT:    1. POTS (postural orthostatic tachycardia syndrome)    PLAN:    In order of problems listed above:  Palpitations -She is off of the long-acting diltiazem.  Overall doing well.  Pots syndrome, orthostatic hypotension, dysautonomia, hyper vagal response -She is now on Florinef with occasional increased blood pressures.  She did check orthostatics 160/88 152/90 117/85 with heart rate 79 76 90.  Occasionally blood pressure will be 160/100. -She may be testing out of [facility] for instance for further therapeutic options. -She is doing all conservative strategies including compression hose salt liberalization fluid liberalization.  Also told her that daily exercise walking 30 minutes would be very helpful for her.  6 month follow up.    Medication Adjustments/Labs and Tests Ordered: Current medicines are reviewed at length with the patient today.  Concerns regarding medicines are outlined above.  No orders of the defined types were placed in this encounter.  No orders of the defined types were placed in this encounter.   Patient Instructions  Medication Instructions:  Your physician recommends that you continue on your current medications as directed. Please refer to the Current Medication list given to you today. *If you need a refill on your cardiac medications before your next appointment, please call your pharmacy*   Lab Work: none If you have labs (blood work) drawn today and your tests are completely normal, you will receive your results only by: Marland Kitchen MyChart Message (if you have MyChart) OR . A paper copy in the mail If you have any lab test that is abnormal or we need to change your treatment, we will call you  to review the results.   Testing/Procedures: none   Follow-Up: At [facility], you and your health needs are our priority.  As part of our continuing mission to provide you with exceptional heart care, we have created designated Provider Care Teams.  These Care Teams include your primary Cardiologist (physician) and  Advanced Practice Providers (APPs -  Physician Assistants and Nurse Practitioners) who all work together to provide you with the care you need, when you need it.  We recommend signing up for the patient portal called "MyChart".  Sign up information is provided on this After Visit Summary.  MyChart is used to connect with patients for Virtual Visits (Telemedicine).  Patients are able to view lab/test results, encounter notes, upcoming appointments, etc.  Non-urgent messages can be sent to your provider as well.   To learn more about what you can do with MyChart, go to [email or web address].    Your next appointment:   6 month(s)  The format for your next appointment:   In Person  Provider:   You may see Candee Furbish, MD  or one of the following Advanced Practice Providers on your designated Care Team:    Kathyrn Drown, NP         Signed, Candee Furbish, MD  02/14/2021 2:04 PM    [place]

## 2021-03-12 ENCOUNTER — Ambulatory Visit (INDEPENDENT_AMBULATORY_CARE_PROVIDER_SITE_OTHER): Payer: Medicare Other

## 2021-03-12 ENCOUNTER — Other Ambulatory Visit: Payer: Self-pay

## 2021-03-12 DIAGNOSIS — N3941 Urge incontinence: Secondary | ICD-10-CM

## 2021-03-12 NOTE — Progress Notes (Signed)
PTNS  Session # monthly maintanence  Health & Social Factors: no change Caffeine: 1 Alcohol: 0 Daytime voids #per day: 5-6 Night-time voids #per night: 0 Urgency: mild Incontinence Episodes #per day: 0 Ankle used: left Treatment Setting: 3 Feeling/ Response: both Comments: patient tolerated well  Performed By: Fonnie Jarvis, CMA  Follow Up: 1 month

## 2021-04-09 ENCOUNTER — Other Ambulatory Visit: Payer: Self-pay

## 2021-04-09 ENCOUNTER — Ambulatory Visit (INDEPENDENT_AMBULATORY_CARE_PROVIDER_SITE_OTHER): Payer: Medicare Other

## 2021-04-09 DIAGNOSIS — N3941 Urge incontinence: Secondary | ICD-10-CM | POA: Diagnosis not present

## 2021-04-09 NOTE — Progress Notes (Signed)
PTNS  Session # monthly maintenance   Health & Social Factors: no change Caffeine: 1 Alcohol: 0 Daytime voids #per day: 6-7 Night-time voids #per night: 0 Urgency: mild Incontinence Episodes #per day: 0 (one accident due to sneezing) Ankle used: right Treatment Setting: 5 Feeling/ Response: both Comments: patient tolerated well  Performed By: Fonnie Jarvis, CMA   Follow Up: 1 month

## 2021-04-09 NOTE — Patient Instructions (Signed)
Tracking Your Bladder Symptoms    Patient Name:___________________________________________________   Sample: Day   Daytime Voids  Nighttime Voids Urgency for the Day(0-4) Number of Accidents Beverage Comments  Monday IIII II 2 I Water IIII Coffee  I      Week Starting:____________________________________   Day Daytime  Voids Nighttime  Voids Urgency for  The Day(0-4) Number of Accidents Beverages Comments                                                           This week my symptoms were:  O much better  O better O the same O worse   

## 2021-05-07 ENCOUNTER — Ambulatory Visit: Payer: Self-pay

## 2021-06-17 IMAGING — CT CT ABD-PEL WO/W CM
4 of 12 series · 12 of 46 positions shown, 18 images · IV contrast (omnipaque)
Comparison: 08/21/2017

CLINICAL DATA: Hematuria.

EXAM:
CT ABDOMEN AND PELVIS WITHOUT AND WITH CONTRAST
TECHNIQUE: Multidetector CT imaging of the abdomen and pelvis was performed
following the standard protocol before and following the bolus
administration of intravenous contrast.
CONTRAST:  150mL OMNIPAQUE IOHEXOL 300 MG/ML  SOLN

[Series 3: axial pre · axial · non-contrast · 0.85mm/px · z∈[+848,+1128]mm · 4 of 94 slices shown, 9 images]
[im 19/94  soft-tissue]
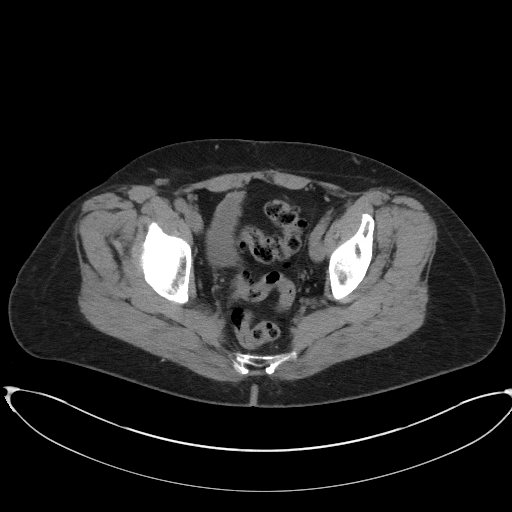
[im 19/94  lung]
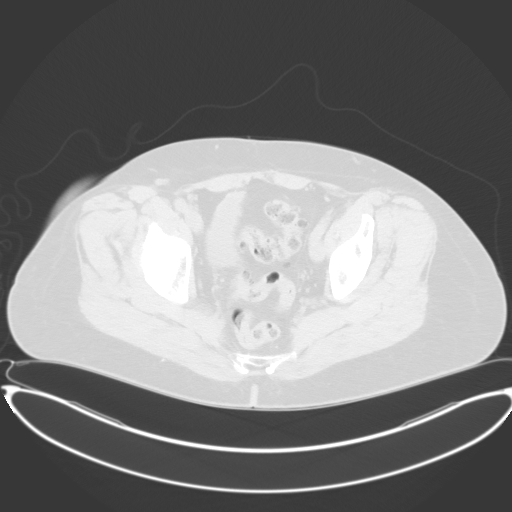
[im 19/94  bone]
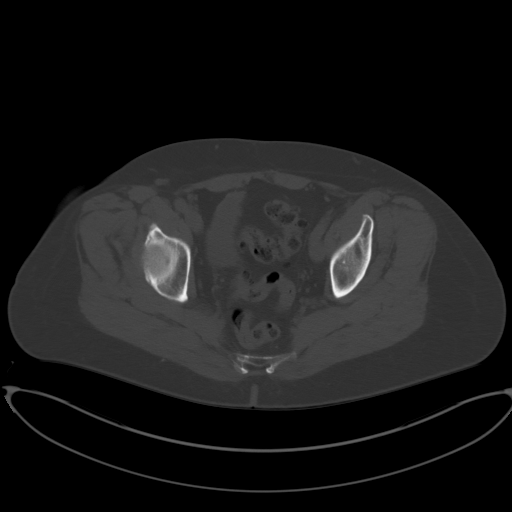
[im 38/94  soft-tissue]
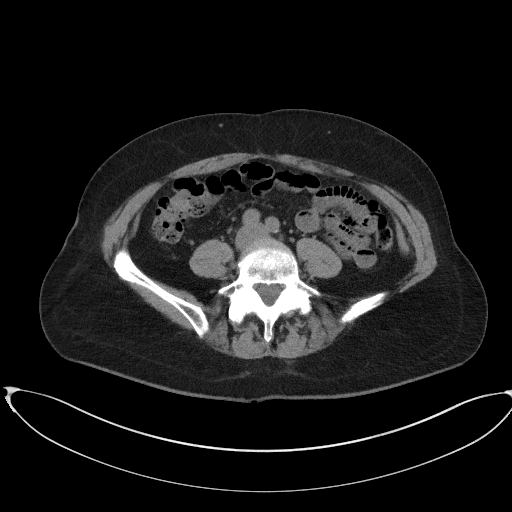
[im 38/94  lung]
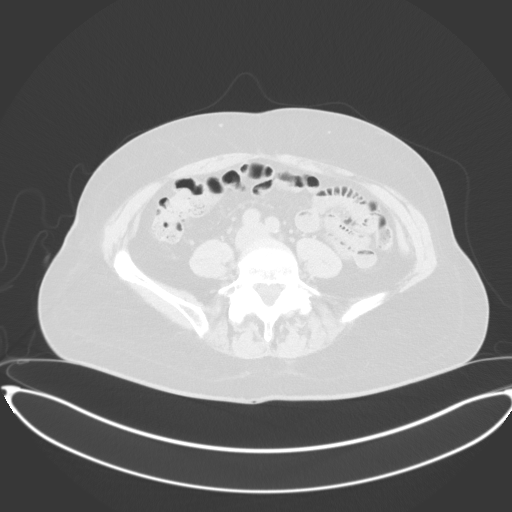
[im 56/94  soft-tissue]
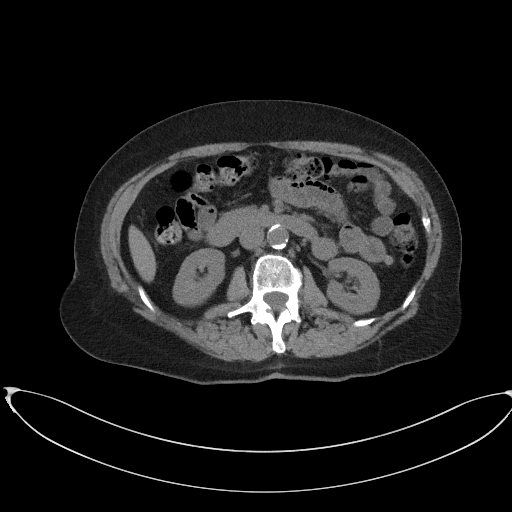
[im 56/94  lung]
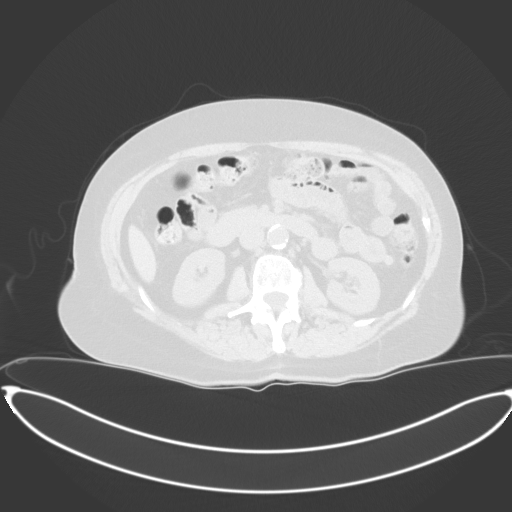
[im 75/94  soft-tissue]
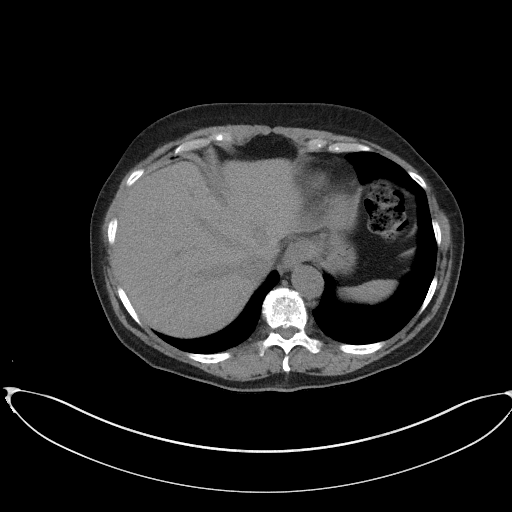
[im 75/94  lung]
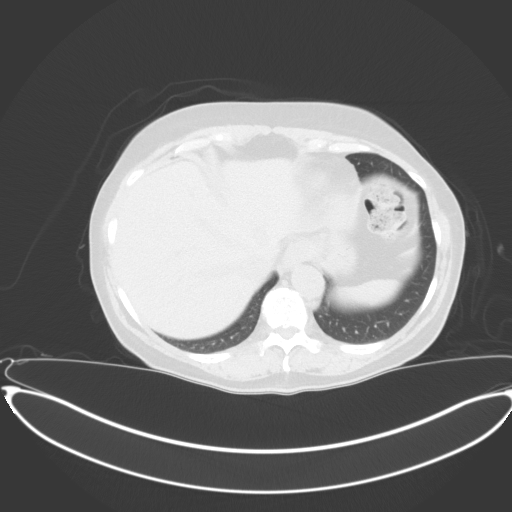

[Series 4: axial post · axial · 0.85mm/px · z∈[+873,+988]mm · 2 of 94 slices shown]
[im 24/94  soft-tissue]
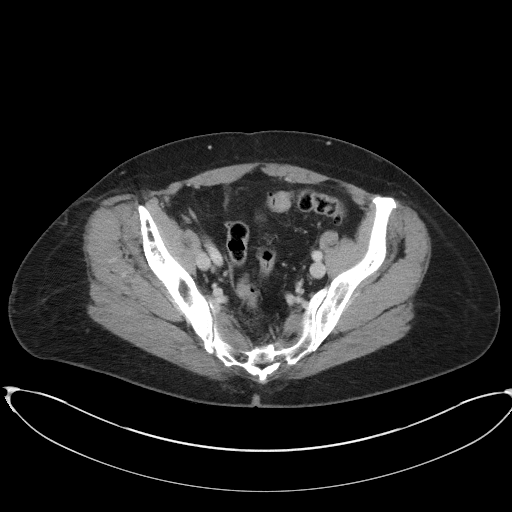
[im 47/94  soft-tissue]
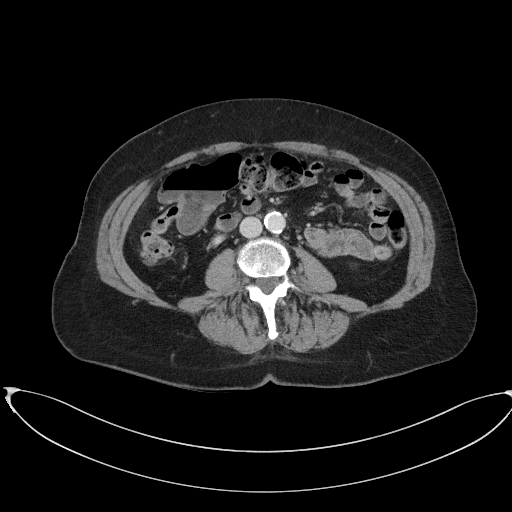

[Series 5: axial delay · axial · delayed · 0.81mm/px · z∈[+855,+1140]mm · 4 of 96 slices shown]
[im 20/96  soft-tissue]
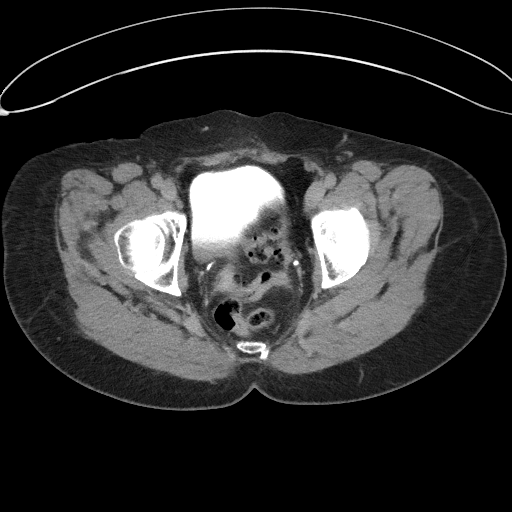
[im 39/96  soft-tissue]
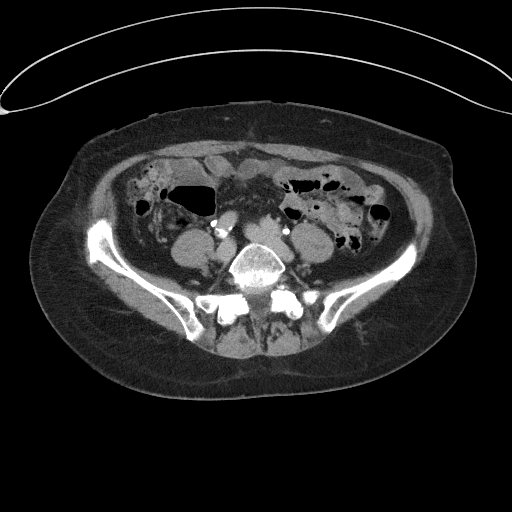
[im 58/96  soft-tissue]
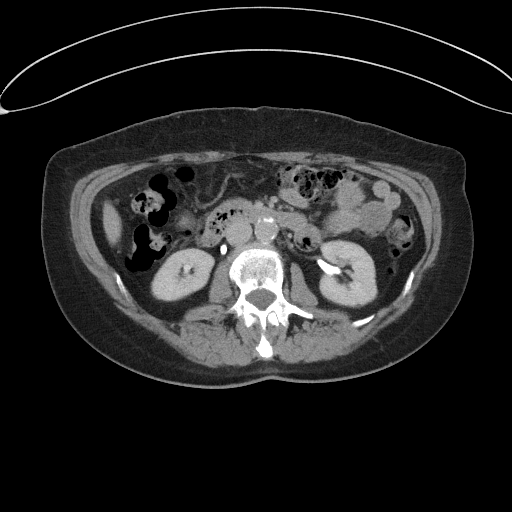
[im 77/96  soft-tissue]
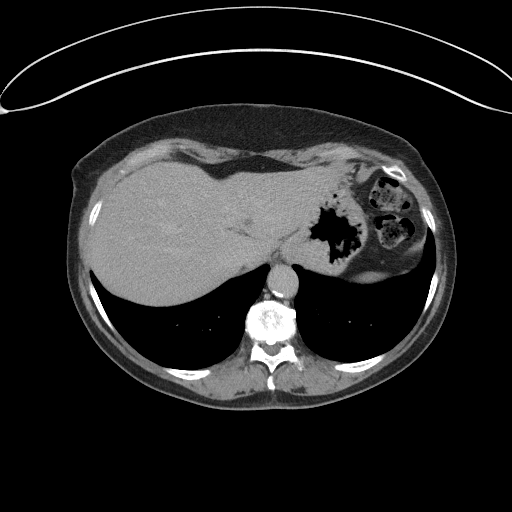

[Series 10: coronal pre · coronal · non-contrast · 0.76mm/px · 2 of 101 slices shown, 3 images]
[im 34/101  soft-tissue]
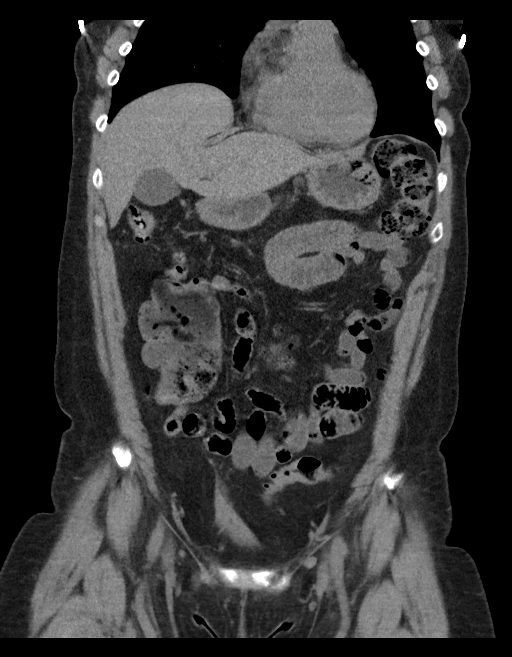
[im 34/101  bone]
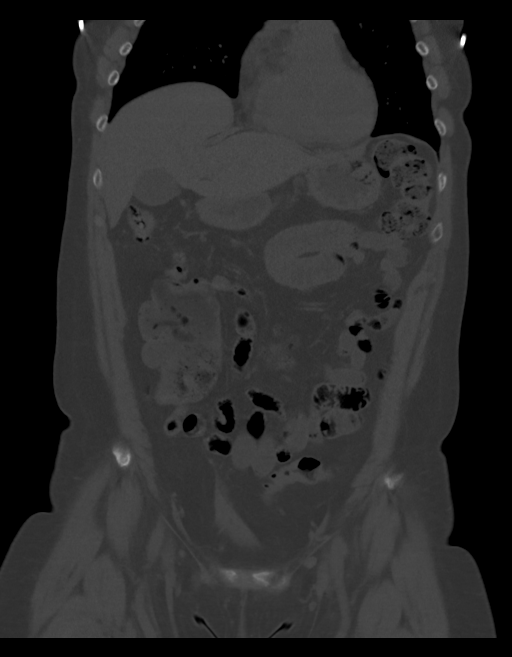
[im 67/101  soft-tissue]
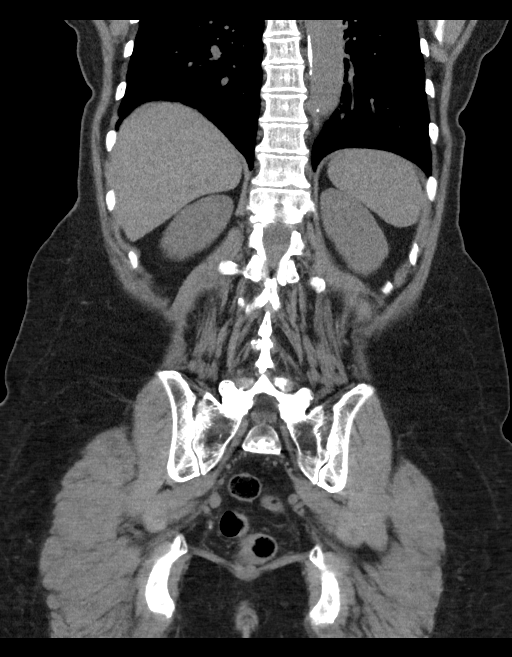

[12 of 46 positions shown; findings below may reference images not displayed]

FINDINGS: Lower chest: 4 mm left lower lobe lung nodule is unchanged, image
[DATE]. No acute findings identified.

Hepatobiliary: 8 mm low-attenuation left hepatic lobe lesion is
unchanged and likely represents a small cyst. Gallbladder
unremarkable. No bile duct dilatation or gallbladder wall
inflammation.

Pancreas: Unremarkable. No pancreatic ductal dilatation or
surrounding inflammatory changes.

Spleen: Normal in size without focal abnormality.

Adrenals/Urinary Tract: Normal adrenal glands. No kidney stones or
hydronephrosis identified. No right kidney lesion. 9 mm
low-attenuation structure within the anterolateral cortex of the
left mid kidney is noted. This is too small to reliably
characterize. Symmetric opacification of the collecting systems an
ureters bilaterally. No suspicious filling defects identified.
Urinary bladder unremarkable.

Stomach/Bowel: Stomach is within normal limits. Appendix not
confidently identified. No evidence of bowel wall thickening,
distention, or inflammatory changes.

Vascular/Lymphatic: Aortic atherosclerosis. No aneurysm. No
abdominopelvic adenopathy.

Reproductive: Status post hysterectomy. No adnexal masses.

Other: No ascites or focal fluid collections.

Musculoskeletal: Facet hypertrophy and degenerative change noted
bilaterally. T12-L1 degenerative disc disease.
IMPRESSION: 1. No findings identified to explain patient's hematuria.
2. Stable 4 mm left lower lobe lung nodule.

Aortic Atherosclerosis ([email or web address]).

## 2021-07-09 ENCOUNTER — Telehealth: Payer: Self-pay | Admitting: Urology

## 2021-07-09 NOTE — Telephone Encounter (Signed)
Pt called office asking about Medicare denying her PTNS treatments.  She said we were supposed to be looking in to this and giving her a call back.  She said it had been a couple of months and she's really noticing a difference since she's not having treatments.  She said Medicare only would allow 12 treatments.

## 2021-12-24 ENCOUNTER — Other Ambulatory Visit: Payer: Self-pay | Admitting: Cardiology

## 2022-03-04 ENCOUNTER — Other Ambulatory Visit: Payer: Self-pay | Admitting: Cardiology

## 2022-06-17 ENCOUNTER — Telehealth: Payer: Self-pay | Admitting: Cardiology

## 2022-06-17 NOTE — Telephone Encounter (Signed)
Dr. Arlana Hove office called and they are requesting the notes that was last written by [facility]. Patient was last seen on 02/14/21. Dr. Isidore Moos are requesting to get those notes faxed in to [phone] Attn: Inetta Fermo

## 2022-06-19 NOTE — Telephone Encounter (Signed)
Please make this office aware they will need to contact our HIM department to obtain records.  The phone # is [phone] and fax [phone] (to fax release of information request).

## 2022-08-12 ENCOUNTER — Encounter: Payer: Self-pay | Admitting: Cardiology

## 2022-08-12 NOTE — Telephone Encounter (Signed)
Error

## 2022-10-10 ENCOUNTER — Telehealth: Payer: Self-pay | Admitting: Cardiology

## 2022-10-10 NOTE — Telephone Encounter (Signed)
ERROR

## 2022-10-10 NOTE — Telephone Encounter (Signed)
Pt called about medical records and requested to speak to practice administrator. Staff message sent to [place].

## 2022-10-11 NOTE — Telephone Encounter (Signed)
Spoke with patient today (10/11/22), Informed her we received the request for records on 08/21/22 and records had been sent via fax on 08/28/22. Informed patient we would send records again. Also give her our medical record number if she needed to reach [place] again, patient was unsure what number she had called before. [phone] )

## 2023-03-04 ENCOUNTER — Encounter: Payer: Self-pay | Admitting: *Deleted
